# Patient Record
Sex: Male | Born: 1955 | Race: White | Hispanic: No | Marital: Single | Smoking: Never smoker
Health system: Southern US, Community
[De-identification: ages and names within clinical notes are randomized; demographics above are authoritative.]

## PROBLEM LIST (undated history)

## (undated) DIAGNOSIS — F79 Unspecified intellectual disabilities: Secondary | ICD-10-CM

## (undated) DIAGNOSIS — M810 Age-related osteoporosis without current pathological fracture: Secondary | ICD-10-CM

## (undated) DIAGNOSIS — E46 Unspecified protein-calorie malnutrition: Secondary | ICD-10-CM

## (undated) DIAGNOSIS — S8290XA Unspecified fracture of unspecified lower leg, initial encounter for closed fracture: Secondary | ICD-10-CM

## (undated) DIAGNOSIS — E079 Disorder of thyroid, unspecified: Secondary | ICD-10-CM

## (undated) DIAGNOSIS — N183 Chronic kidney disease, stage 3 (moderate): Secondary | ICD-10-CM

## (undated) DIAGNOSIS — K56609 Unspecified intestinal obstruction, unspecified as to partial versus complete obstruction: Secondary | ICD-10-CM

## (undated) DIAGNOSIS — E785 Hyperlipidemia, unspecified: Secondary | ICD-10-CM

## (undated) DIAGNOSIS — F840 Autistic disorder: Secondary | ICD-10-CM

## (undated) DIAGNOSIS — E7800 Pure hypercholesterolemia, unspecified: Secondary | ICD-10-CM

## (undated) DIAGNOSIS — H3320 Serous retinal detachment, unspecified eye: Secondary | ICD-10-CM

## (undated) HISTORY — PX: ABDOMINAL SURGERY: SHX537

## (undated) HISTORY — PX: LEG SURGERY: SHX1003

## (undated) HISTORY — PX: APPENDECTOMY: SHX54

---

## 2011-09-25 ENCOUNTER — Encounter (HOSPITAL_BASED_OUTPATIENT_CLINIC_OR_DEPARTMENT_OTHER): Payer: Self-pay | Admitting: Emergency Medicine

## 2011-09-25 ENCOUNTER — Emergency Department (HOSPITAL_BASED_OUTPATIENT_CLINIC_OR_DEPARTMENT_OTHER): Payer: Medicare Other

## 2011-09-25 ENCOUNTER — Emergency Department (HOSPITAL_BASED_OUTPATIENT_CLINIC_OR_DEPARTMENT_OTHER)
Admission: EM | Admit: 2011-09-25 | Discharge: 2011-09-25 | Disposition: A | Discharge status: Home / Self Care | Payer: Medicare Other | Attending: Emergency Medicine | Admitting: Emergency Medicine

## 2011-09-25 DIAGNOSIS — E7800 Pure hypercholesterolemia, unspecified: Secondary | ICD-10-CM | POA: Insufficient documentation

## 2011-09-25 DIAGNOSIS — K5900 Constipation, unspecified: Secondary | ICD-10-CM | POA: Insufficient documentation

## 2011-09-25 DIAGNOSIS — N289 Disorder of kidney and ureter, unspecified: Secondary | ICD-10-CM | POA: Insufficient documentation

## 2011-09-25 DIAGNOSIS — Z79899 Other long term (current) drug therapy: Secondary | ICD-10-CM | POA: Insufficient documentation

## 2011-09-25 DIAGNOSIS — F840 Autistic disorder: Secondary | ICD-10-CM | POA: Insufficient documentation

## 2011-09-25 HISTORY — DX: Unspecified intestinal obstruction, unspecified as to partial versus complete obstruction: K56.609

## 2011-09-25 HISTORY — DX: Age-related osteoporosis without current pathological fracture: M81.0

## 2011-09-25 HISTORY — DX: Unspecified fracture of unspecified lower leg, initial encounter for closed fracture: S82.90XA

## 2011-09-25 HISTORY — DX: Pure hypercholesterolemia, unspecified: E78.00

## 2011-09-25 HISTORY — DX: Autistic disorder: F84.0

## 2011-09-25 LAB — URINALYSIS, ROUTINE W REFLEX MICROSCOPIC
Glucose, UA: NEGATIVE mg/dL
Ketones, ur: NEGATIVE mg/dL
Leukocytes, UA: NEGATIVE
Nitrite: NEGATIVE
Protein, ur: 100 mg/dL — AB
Urobilinogen, UA: 0.2 mg/dL (ref 0.0–1.0)

## 2011-09-25 LAB — COMPREHENSIVE METABOLIC PANEL
Albumin: 4.6 g/dL (ref 3.5–5.2)
BUN: 19 mg/dL (ref 6–23)
Chloride: 104 mEq/L (ref 96–112)
Creatinine, Ser: 1.8 mg/dL — ABNORMAL HIGH (ref 0.50–1.35)
GFR calc Af Amer: 48 mL/min — ABNORMAL LOW (ref >90)
GFR calc non Af Amer: 41 mL/min — ABNORMAL LOW (ref >90)
Glucose, Bld: 98 mg/dL (ref 70–99)
Total Bilirubin: 0.3 mg/dL (ref 0.3–1.2)

## 2011-09-25 LAB — URINE MICROSCOPIC-ADD ON

## 2011-09-25 LAB — CBC
HCT: 47.3 % (ref 39.0–52.0)
Hemoglobin: 16.5 g/dL (ref 13.0–17.0)
MCV: 92.9 fL (ref 78.0–100.0)
RDW: 12.2 % (ref 11.5–15.5)
WBC: 7.3 10*3/uL (ref 4.0–10.5)

## 2011-09-25 MED ORDER — SODIUM CHLORIDE 0.9 % IV SOLN: Freq: Once | INTRAVENOUS | Status: DC

## 2011-09-25 MED ORDER — SODIUM CHLORIDE 0.9 % IV BOLUS (SEPSIS)
1000.0000 mL | Freq: Once | INTRAVENOUS | Status: AC
  Administered 2011-09-25: 1000 mL via INTRAVENOUS

## 2011-09-25 NOTE — ED Provider Notes (Signed)
History     CSN: [id number]  Arrival date & time 09/25/11  1335   First MD Initiated Contact with Patient 09/25/11 1515      Chief Complaint  Patient presents with  . Anorexia  . Constipation  . Fatigue    (Consider location/radiation/quality/duration/timing/severity/associated sxs/prior treatment) Patient is a 56 y.o. male presenting with constipation. The history is provided by a caregiver.  Constipation    patient here with altered mental status consisting of decreased level of consciousness according to the caregiver. No recent fever, vomiting, chills. No recent history of falls. Patient has a history of autism and is nonverbal at baseline. Caregiverhe has had decreased urination and decreased activity per his norm. No recent change in his phenobarbital medications. He is currently being evaluated for thyroid fluctuations. She also notes that he is an sleeping a lot and has been slightly warm to touch no medications taken prior to arrival. Nothing seems to make his symptoms better or worse  Past Medical History  Diagnosis Date  . Autism spectrum disorder   . Hypercholesteremia   . Thyroid condition   . Bowel obstruction   . Broken leg   . Osteoporosis     Past Surgical History  Procedure Date  . Abdominal surgery   . Leg surgery     History reviewed. No pertinent family history.  History  Substance Use Topics  . Smoking status: Never Smoker   . Smokeless tobacco: Not on file  . Alcohol Use: No      Review of Systems  Gastrointestinal: Positive for constipation.  All other systems reviewed and are negative.    Allergies  Review of patient's allergies indicates no known allergies.  Home Medications   Current Outpatient Rx  Name Route Sig Dispense Refill  . CALCIUM CITRATE-VITAMIN D 200-200 MG-UNIT PO TABS Oral Take 1 tablet by mouth daily.    Marland Kitchen VITAMIN D 1000 UNITS PO TABS Oral Take 2,000 Units by mouth daily.    Marland Kitchen DOCUSATE SODIUM 100 MG PO CAPS Oral  Take 100 mg by mouth 2 (two) times daily. Patient is given this medication to help with constipation.    . MULTI-VITAMIN/MINERALS PO TABS Oral Take 1 tablet by mouth daily.    Marland Kitchen PHENOBARBITAL 30 MG PO TABS Oral Take 30 mg by mouth 2 (two) times daily.    Marland Kitchen PHENOBARBITAL 60 MG PO TABS Oral Take 60 mg by mouth 2 (two) times daily.    Marland Kitchen SIMVASTATIN 40 MG PO TABS Oral Take 40 mg by mouth every evening.      BP 135/91  Pulse 98  Temp 97.6 F (36.4 C) (Oral)  Resp 20  Ht 6' (1.829 m)  Wt 180 lb (81.647 kg)  BMI 24.41 kg/m2  SpO2 99%  Physical Exam  Nursing note and vitals reviewed. Constitutional: He appears well-developed and well-nourished.  Non-toxic appearance. No distress.  HENT:  Head: Normocephalic and atraumatic.  Eyes: Conjunctivae, EOM and lids are normal. Pupils are equal, round, and reactive to light.  Neck: Normal range of motion. Neck supple. No tracheal deviation present. No mass present.  Cardiovascular: Normal rate, regular rhythm and normal heart sounds.  Exam reveals no gallop.   No murmur heard. Pulmonary/Chest: Effort normal and breath sounds normal. No stridor. No respiratory distress. He has no decreased breath sounds. He has no wheezes. He has no rhonchi. He has no rales.  Abdominal: Soft. Normal appearance and bowel sounds are normal. He exhibits no distension. There is no  tenderness. There is no rebound and no CVA tenderness.  Musculoskeletal: Normal range of motion. He exhibits no edema and no tenderness.  Neurological: He is alert. He has normal strength. No cranial nerve deficit or sensory deficit.  Skin: Skin is warm and dry. No abrasion and no rash noted.  Psychiatric: His affect is blunt. He is slowed.    ED Course  Procedures (including critical care time)  Labs Reviewed  COMPREHENSIVE METABOLIC PANEL - Abnormal; Notable for the following:    Creatinine, Ser 1.80 (*)     Alkaline Phosphatase 124 (*)     GFR calc non Af Amer 41 (*)     GFR calc Af  Amer 48 (*)     All other components within normal limits  CBC  URINALYSIS, ROUTINE W REFLEX MICROSCOPIC  CBC WITH DIFFERENTIAL  COMPREHENSIVE METABOLIC PANEL  T4  TSH  PHENOBARBITAL LEVEL  URINE CULTURE   No results found.   No diagnosis found.    MDM  Patient given IV fluids here and it has improved his activity . Patient's renal insufficiency noted and family instructed to followup with his Dr. for this. No evidence of serious back to infection at this time. Caregivers note that patient has had similar symptoms of waxing and waning like this in the past        Toy Baker, MD 09/25/11 [id number]

## 2011-09-25 NOTE — ED Notes (Signed)
Pt has decreased appetite, decreased bm, sleeping a lot, holding his head as if something is wrong.  Pt is autistic and non-verbal.

## 2011-09-25 NOTE — Discharge Instructions (Signed)
Followup with your Dr. next week to have your kidney function repeated. Your creatinine was 1.8 here and this needs to be rechecked. Have your doctor followup on your thyroid function studies which were performed here. Return here at once for vomiting, fever, or any other problems Dehydration, Adult Dehydration is when you lose more fluids from the body than you take in. Vital organs like the kidneys, brain, and heart cannot function without a proper amount of fluids and salt. Any loss of fluids from the body can cause dehydration.  CAUSES   Vomiting.   Diarrhea.   Excessive sweating.   Excessive urine output.   Fever.  SYMPTOMS  Mild dehydration  Thirst.   Dry lips.   Slightly dry mouth.  Moderate dehydration  Very dry mouth.   Sunken eyes.   Skin does not bounce back quickly when lightly pinched and released.   Dark urine and decreased urine production.   Decreased tear production.   Headache.  Severe dehydration  Very dry mouth.   Extreme thirst.   Rapid, weak pulse (more than 100 beats per minute at rest).   Cold hands and feet.   Not able to sweat in spite of heat and temperature.   Rapid breathing.   Blue lips.   Confusion and lethargy.   Difficulty being awakened.   Minimal urine production.   No tears.  DIAGNOSIS  Your caregiver will diagnose dehydration based on your symptoms and your exam. Blood and urine tests will help confirm the diagnosis. The diagnostic evaluation should also identify the cause of dehydration. TREATMENT  Treatment of mild or moderate dehydration can often be done at home by increasing the amount of fluids that you drink. It is best to drink small amounts of fluid more often. Drinking too much at one time can make vomiting worse. Refer to the home care instructions below. Severe dehydration needs to be treated at the hospital where you will probably be given intravenous (IV) fluids that contain water and electrolytes. HOME  CARE INSTRUCTIONS   Ask your caregiver about specific rehydration instructions.   Drink enough fluids to keep your urine clear or pale yellow.   Drink small amounts frequently if you have nausea and vomiting.   Eat as you normally do.   Avoid:   Foods or drinks high in sugar.   Carbonated drinks.   Juice.   Extremely hot or cold fluids.   Drinks with caffeine.   Fatty, greasy foods.   Alcohol.   Tobacco.   Overeating.   Gelatin desserts.   Wash your hands well to avoid spreading bacteria and viruses.   Only take over-the-counter or prescription medicines for pain, discomfort, or fever as directed by your caregiver.   Ask your caregiver if you should continue all prescribed and over-the-counter medicines.   Keep all follow-up appointments with your caregiver.  SEEK MEDICAL CARE IF:  You have abdominal pain and it increases or stays in one area (localizes).   You have a rash, stiff neck, or severe headache.   You are irritable, sleepy, or difficult to awaken.   You are weak, dizzy, or extremely thirsty.  SEEK IMMEDIATE MEDICAL CARE IF:   You are unable to keep fluids down or you get worse despite treatment.   You have frequent episodes of vomiting or diarrhea.   You have blood or green matter (bile) in your vomit.   You have blood in your stool or your stool looks black and tarry.   You  have not urinated in 6 to 8 hours, or you have only urinated a small amount of very dark urine.   You have a fever.   You faint.  MAKE SURE YOU:   Understand these instructions.   Will watch your condition.   Will get help right away if you are not doing well or get worse.  Document Released: 03/17/2005 Document Revised: 03/06/2011 Document Reviewed: 11/04/2010 [employer] [facility] [employer] Patient Information 2012 [place], [place].

## 2011-09-26 LAB — T4: T4, Total: 7.3 ug/dL (ref 5.0–12.5)

## 2011-09-26 LAB — URINE CULTURE
Colony Count: NO GROWTH
Culture: NO GROWTH

## 2012-03-26 ENCOUNTER — Encounter: Payer: Self-pay | Admitting: *Deleted

## 2012-03-26 ENCOUNTER — Emergency Department (INDEPENDENT_AMBULATORY_CARE_PROVIDER_SITE_OTHER)
Admission: EM | Admit: 2012-03-26 | Discharge: 2012-03-26 | Disposition: A | Discharge status: Home / Self Care | Payer: Medicare Other | Source: Home / Self Care | Attending: Family Medicine | Admitting: Family Medicine

## 2012-03-26 DIAGNOSIS — J209 Acute bronchitis, unspecified: Secondary | ICD-10-CM

## 2012-03-26 DIAGNOSIS — R197 Diarrhea, unspecified: Secondary | ICD-10-CM

## 2012-03-26 HISTORY — DX: Unspecified intellectual disabilities: F79

## 2012-03-26 HISTORY — DX: Hyperlipidemia, unspecified: E78.5

## 2012-03-26 HISTORY — DX: Unspecified protein-calorie malnutrition: E46

## 2012-03-26 MED ORDER — GUAIFENESIN-CODEINE 100-10 MG/5ML PO SYRP: 10.0000 mL | ORAL_SOLUTION | Freq: Every day | ORAL | Status: DC

## 2012-03-26 MED ORDER — AZITHROMYCIN 250 MG PO TABS: ORAL_TABLET | ORAL | Status: DC

## 2012-03-26 NOTE — ED Provider Notes (Signed)
History     CSN: [id number]  Arrival date & time 03/26/12  1414   First MD Initiated Contact with Patient 03/26/12 1448      Chief Complaint  Patient presents with  . Nasal Congestion  . Cough      HPI Comments: Patient has autism and mentally disabled; presents with his brother. Duane developed a mild cough, mild sore throat, and nasal congestion about a week ago.  These symptoms have persisted and about 40 hours ago he developed watery diarrhea.  No nausea/vomiting.  No abdominal pain.  No blood in stool.  The history is provided by a relative.    Past Medical History  Diagnosis Date  . Autism spectrum disorder   . Hypercholesteremia   . Thyroid condition   . Bowel obstruction   . Broken leg   . Osteoporosis   . Mental retardation   . Hyperlipidemia   . Malnutrition     Past Surgical History  Procedure Date  . Abdominal surgery   . Leg surgery     Family History  Problem Relation Age of Onset  . Hypertension Other   . Diabetes Other   . Hyperlipidemia Other   . Scoliosis Other     History  Substance Use Topics  . Smoking status: Never Smoker   . Smokeless tobacco: Not on file  . Alcohol Use: No      Review of Systems + sore throat + cough No pleuritic pain No wheezing + nasal congestion No itchy/red eyes No earache No hemoptysis No SOB No fever/chills No nausea No vomiting No abdominal pain + diarrhea No urinary symptoms No skin rashes + fatigue ? myalgias + headache Used OTC meds without relief   Allergies  Review of patient's allergies indicates no known allergies.  Home Medications   Current Outpatient Rx  Name  Route  Sig  Dispense  Refill  . AZITHROMYCIN 250 MG PO TABS      Take 2 tabs today; then begin one tab once daily for 4 more days.   6 each   0   . CALCIUM CITRATE-VITAMIN D 200-200 MG-UNIT PO TABS   Oral   Take 1 tablet by mouth daily.         Marland Kitchen VITAMIN D 1000 UNITS PO TABS   Oral   Take 2,000 Units by  mouth daily.         Marland Kitchen DOCUSATE SODIUM 100 MG PO CAPS   Oral   Take 100 mg by mouth 2 (two) times daily. Patient is given this medication to help with constipation.         . GUAIFENESIN-CODEINE 100-10 MG/5ML PO SYRP   Oral   Take 10 mLs by mouth at bedtime. for cough   120 mL   0   . MULTI-VITAMIN/MINERALS PO TABS   Oral   Take 1 tablet by mouth daily.         Marland Kitchen PHENOBARBITAL 30 MG PO TABS   Oral   Take 30 mg by mouth 2 (two) times daily.         Marland Kitchen PHENOBARBITAL 60 MG PO TABS   Oral   Take 60 mg by mouth 2 (two) times daily.         Marland Kitchen SIMVASTATIN 40 MG PO TABS   Oral   Take 40 mg by mouth every evening.           BP 145/94  Pulse 87  Temp 98.1 F (36.7 C) (Oral)  Resp 16  Ht 5\' 11"  (1.803 m)  Wt 180 lb 8 oz (81.874 kg)  BMI 25.17 kg/m2  SpO2 97%  Physical Exam Nursing notes and Vital Signs reviewed. Appearance:  Patient appears stated age, and in no acute distress.  He is alert and generally cooperative, but does not communicate verbally. Eyes:  Pupils are equal, round, and reactive to light and accomodation.  Extraocular movement is intact.  Conjunctivae are not inflamed  Ears:  Canals normal.  Tympanic membranes normal.  Nose:  Mildly congested turbinates.  No sinus tenderness.   Pharynx:  Normal Neck:  Supple.  Slightly tender shotty posterior nodes are palpated bilaterally  Lungs:  Clear to auscultation.  Breath sounds are equal.  Heart:  Regular rate and rhythm without murmurs, rubs, or gallops.  Abdomen:  Nontender without masses or hepatosplenomegaly.  Bowel sounds are present.  No CVA or flank tenderness.  Extremities:  No edema.  No calf tenderness Skin:  No rash present.   ED Course  Procedures  none      1. Acute bronchitis   2. Diarrhea       MDM  With onset of diarrhea raises possibility of atypical agent. Begin Z-pack.  Tussi-organidin at bedtime for cough. Begin Pedialyte until diarrhea slows, then may switch to clear  liquid diet.  Advance to a BRAT diet as tolerated, and finally advance to regular diet.  Avoid milk products until well. Take plain Mucinex (guaifenesin) twice daily for cough and congestion.  Increase fluid intake, rest. Follow-up with family doctor if not improving 7 to 10 days.         Lattie Haw, MD 03/29/12 [phone]

## 2012-03-26 NOTE — ED Notes (Signed)
Patient c/o cough, congestion x 1 wk; has tried Robitussin with little help. Patient c/o diarrhea x 36 hours.

## 2012-09-03 ENCOUNTER — Encounter: Payer: Self-pay | Admitting: Family Medicine

## 2012-09-03 ENCOUNTER — Ambulatory Visit (INDEPENDENT_AMBULATORY_CARE_PROVIDER_SITE_OTHER): Payer: Medicare Other | Admitting: Family Medicine

## 2012-09-03 VITALS — BP 116/75 | HR 58 | Wt 181.0 lb

## 2012-09-03 DIAGNOSIS — R531 Weakness: Secondary | ICD-10-CM

## 2012-09-03 DIAGNOSIS — G40909 Epilepsy, unspecified, not intractable, without status epilepticus: Secondary | ICD-10-CM | POA: Insufficient documentation

## 2012-09-03 DIAGNOSIS — G4700 Insomnia, unspecified: Secondary | ICD-10-CM

## 2012-09-03 DIAGNOSIS — F79 Unspecified intellectual disabilities: Secondary | ICD-10-CM | POA: Insufficient documentation

## 2012-09-03 DIAGNOSIS — Z8669 Personal history of other diseases of the nervous system and sense organs: Secondary | ICD-10-CM | POA: Insufficient documentation

## 2012-09-03 DIAGNOSIS — E785 Hyperlipidemia, unspecified: Secondary | ICD-10-CM | POA: Insufficient documentation

## 2012-09-03 DIAGNOSIS — M4120 Other idiopathic scoliosis, site unspecified: Secondary | ICD-10-CM | POA: Insufficient documentation

## 2012-09-03 DIAGNOSIS — M67919 Unspecified disorder of synovium and tendon, unspecified shoulder: Secondary | ICD-10-CM

## 2012-09-03 DIAGNOSIS — R269 Unspecified abnormalities of gait and mobility: Secondary | ICD-10-CM

## 2012-09-03 DIAGNOSIS — Z87898 Personal history of other specified conditions: Secondary | ICD-10-CM

## 2012-09-03 DIAGNOSIS — M810 Age-related osteoporosis without current pathological fracture: Secondary | ICD-10-CM | POA: Insufficient documentation

## 2012-09-03 DIAGNOSIS — M75100 Unspecified rotator cuff tear or rupture of unspecified shoulder, not specified as traumatic: Secondary | ICD-10-CM

## 2012-09-03 DIAGNOSIS — R5383 Other fatigue: Secondary | ICD-10-CM

## 2012-09-03 DIAGNOSIS — F5101 Primary insomnia: Secondary | ICD-10-CM | POA: Insufficient documentation

## 2012-09-03 DIAGNOSIS — R5381 Other malaise: Secondary | ICD-10-CM

## 2012-09-03 DIAGNOSIS — F840 Autistic disorder: Secondary | ICD-10-CM | POA: Insufficient documentation
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
## 2012-09-07 ENCOUNTER — Ambulatory Visit: Payer: Medicare Other | Attending: Family Medicine | Admitting: Rehabilitation

## 2012-09-07 ENCOUNTER — Encounter: Payer: Self-pay | Admitting: Family Medicine

## 2012-09-07 DIAGNOSIS — M25619 Stiffness of unspecified shoulder, not elsewhere classified: Secondary | ICD-10-CM | POA: Insufficient documentation

## 2012-09-07 DIAGNOSIS — IMO0001 Reserved for inherently not codable concepts without codable children: Secondary | ICD-10-CM | POA: Insufficient documentation

## 2012-09-09 ENCOUNTER — Ambulatory Visit: Payer: Medicare Other | Admitting: Rehabilitation

## 2012-09-13 ENCOUNTER — Ambulatory Visit: Payer: Medicare Other | Admitting: Rehabilitation

## 2012-09-16 ENCOUNTER — Ambulatory Visit: Payer: Medicare Other | Admitting: Rehabilitation

## 2012-09-20 ENCOUNTER — Ambulatory Visit: Payer: Medicare Other | Admitting: Rehabilitation

## 2012-09-23 ENCOUNTER — Ambulatory Visit: Payer: Medicare Other | Admitting: Rehabilitation

## 2013-02-23 ENCOUNTER — Encounter (HOSPITAL_BASED_OUTPATIENT_CLINIC_OR_DEPARTMENT_OTHER): Payer: Self-pay | Admitting: Emergency Medicine

## 2013-02-23 ENCOUNTER — Emergency Department (HOSPITAL_BASED_OUTPATIENT_CLINIC_OR_DEPARTMENT_OTHER): Payer: Medicare Other

## 2013-02-23 ENCOUNTER — Inpatient Hospital Stay (HOSPITAL_BASED_OUTPATIENT_CLINIC_OR_DEPARTMENT_OTHER)
Admission: EM | Admit: 2013-02-23 | Discharge: 2013-03-01 | DRG: 389 | Disposition: A | Discharge status: Home / Self Care | Payer: Medicare Other | Attending: Surgery | Admitting: Surgery

## 2013-02-23 DIAGNOSIS — F79 Unspecified intellectual disabilities: Secondary | ICD-10-CM | POA: Diagnosis present

## 2013-02-23 DIAGNOSIS — K562 Volvulus: Secondary | ICD-10-CM | POA: Diagnosis present

## 2013-02-23 DIAGNOSIS — Y838 Other surgical procedures as the cause of abnormal reaction of the patient, or of later complication, without mention of misadventure at the time of the procedure: Secondary | ICD-10-CM | POA: Diagnosis present

## 2013-02-23 DIAGNOSIS — E7800 Pure hypercholesterolemia, unspecified: Secondary | ICD-10-CM | POA: Diagnosis present

## 2013-02-23 DIAGNOSIS — R630 Anorexia: Secondary | ICD-10-CM | POA: Diagnosis present

## 2013-02-23 DIAGNOSIS — M810 Age-related osteoporosis without current pathological fracture: Secondary | ICD-10-CM | POA: Diagnosis present

## 2013-02-23 DIAGNOSIS — E860 Dehydration: Secondary | ICD-10-CM | POA: Diagnosis present

## 2013-02-23 DIAGNOSIS — K56609 Unspecified intestinal obstruction, unspecified as to partial versus complete obstruction: Secondary | ICD-10-CM

## 2013-02-23 DIAGNOSIS — Z8719 Personal history of other diseases of the digestive system: Secondary | ICD-10-CM | POA: Diagnosis present

## 2013-02-23 DIAGNOSIS — F840 Autistic disorder: Secondary | ICD-10-CM | POA: Diagnosis present

## 2013-02-23 DIAGNOSIS — K565 Intestinal adhesions [bands], unspecified as to partial versus complete obstruction: Principal | ICD-10-CM | POA: Diagnosis present

## 2013-02-23 DIAGNOSIS — E785 Hyperlipidemia, unspecified: Secondary | ICD-10-CM | POA: Diagnosis present

## 2013-02-23 DIAGNOSIS — N289 Disorder of kidney and ureter, unspecified: Secondary | ICD-10-CM | POA: Diagnosis present

## 2013-02-23 LAB — URINALYSIS, ROUTINE W REFLEX MICROSCOPIC
Bilirubin Urine: NEGATIVE
Ketones, ur: NEGATIVE mg/dL
Leukocytes, UA: NEGATIVE
Nitrite: NEGATIVE
Protein, ur: 100 mg/dL — AB

## 2013-02-23 LAB — URINE MICROSCOPIC-ADD ON

## 2013-02-23 MED ORDER — FENTANYL CITRATE 0.05 MG/ML IJ SOLN
50.0000 ug | Freq: Once | INTRAMUSCULAR | Status: AC
  Administered 2013-02-24: 50 ug via INTRAVENOUS
  Filled 2013-02-23: qty 2

## 2013-02-23 MED ORDER — SODIUM CHLORIDE 0.9 % IV BOLUS (SEPSIS)
500.0000 mL | Freq: Once | INTRAVENOUS | Status: AC
  Administered 2013-02-24: 1000 mL via INTRAVENOUS

## 2013-02-23 NOTE — ED Notes (Signed)
Pt family states that he noticed the pt to be more restless and curled up in the bed yesterday.  Pt family is concerned that the pt may be having abd pain.

## 2013-02-23 NOTE — ED Provider Notes (Signed)
CSN: [id number]     Arrival date & time 02/23/13  2321 History  This chart was scribed for Ruby Dilone Smitty Cords, MD by Ardelia Mems, ED Scribe. This patient was seen in room MH05/MH05 and the patient's care was started at 11:34 PM.   Chief Complaint  Patient presents with  . Abdominal Pain    Patient is a 57 y.o. male presenting with abdominal pain. The history is provided by a relative, a caregiver and the patient. The history is limited by the condition of the patient (Pt nonverbal, hx of MR). No language interpreter was used.  Abdominal Pain Pain location:  Generalized Onset quality:  Gradual Timing:  Constant Progression:  Unchanged Relieved by:  Nothing Worsened by:  Nothing tried Ineffective treatments:  None tried Associated symptoms: no vomiting   Risk factors: multiple surgeries    LEVEL 5 CAVEAT (Nonverbal condition of the patient)  HPI Comments: Maron Stanzione is a 57 y.o. male with a history of mental retardation and autism spectrum disorder brought by relatives who are his caregivers to the Emergency Department complaining of what relatives believe is abdominal pain. Relatives report that pt is nonverbal and has not communicated specifically that been having abdominal pain, but relatives suspect he is having abdominal pain. Relatives states that pt has been "off today". Relatives state that pt has been eating less today, has been sitting with his knees retracted at times, and has been hunching over at times. Relatives also states that pt normally lives in a long term care facility in [place] and is currently visiting them this month as is his yearly routine. Relatives state that pt has been mildly constipated lately. They also states that pt's diet is different here than it was in [place]. Relatives states that pt had "twisted intestines" 20 years ago, which required 2 surgeries. Relatives deny noticing any fever, emesis, dysuria, coughing, tugging at his ears or any other  symptoms.  Relative further states that pt does not emote pain at all. They report that pt has a history of a traumatic tib/fib fracture, and that he acted "completely fine" normal after this.  PCP- Dr. [name]   Past Medical History  Diagnosis Date  . Autism spectrum disorder   . Hypercholesteremia   . Thyroid condition   . Bowel obstruction   . Broken leg Left  . Osteoporosis   . Mental retardation   . Hyperlipidemia   . Malnutrition    Past Surgical History  Procedure Laterality Date  . Abdominal surgery    . Leg surgery     Family History  Problem Relation Age of Onset  . Hypertension Other   . Diabetes Other   . Hyperlipidemia Other   . Scoliosis Other    History  Substance Use Topics  . Smoking status: Never Smoker   . Smokeless tobacco: Not on file  . Alcohol Use: No    Review of Systems  Unable to perform ROS: Patient nonverbal  Gastrointestinal: Positive for abdominal pain. Negative for vomiting.   Allergies  Ofloxacin and Phenothiazines  Home Medications   Current Outpatient Rx  Name  Route  Sig  Dispense  Refill  . calcium citrate-vitamin D 200-200 MG-UNIT TABS   Oral   Take 1 tablet by mouth daily.         . cholecalciferol (VITAMIN D) 1000 UNITS tablet   Oral   Take 2,000 Units by mouth daily.         Marland Kitchen docusate sodium (COLACE)  100 MG capsule   Oral   Take 100 mg by mouth 2 (two) times daily. Patient is given this medication to help with constipation.         . Multiple Vitamins-Minerals (MULTIVITAMIN WITH MINERALS) tablet   Oral   Take 1 tablet by mouth daily.         Marland Kitchen PHENObarbital (LUMINAL) 30 MG tablet   Oral   Take 30 mg by mouth 2 (two) times daily.         Marland Kitchen PHENObarbital (LUMINAL) 60 MG tablet   Oral   Take 60 mg by mouth 2 (two) times daily.         . simvastatin (ZOCOR) 40 MG tablet   Oral   Take 40 mg by mouth every evening.          Triage Vitals: BP 154/94  Pulse 95  Temp(Src) 98 F  (36.7 C) (Oral)  SpO2 100%  Physical Exam  Nursing note and vitals reviewed. Constitutional: He is oriented to person, place, and time. He appears well-developed and well-nourished. No distress.  Pt doesn't emote pain. Does not wince in pain even with trauma per family.  HENT:  Head: Normocephalic and atraumatic.  Moist mucous membranes.  Eyes: EOM are normal. Pupils are equal, round, and reactive to light.  Neck: Neck supple. No tracheal deviation present.  Cardiovascular: Normal rate, regular rhythm and normal heart sounds.   Pulmonary/Chest: Effort normal and breath sounds normal. No respiratory distress. He has no wheezes. He has no rales. He exhibits no tenderness.  Abdominal: Soft. He exhibits distension. There is no rebound and no guarding.  Hypoactive except in the LLQ. Large amount of gas in LLQ.  Musculoskeletal: Normal range of motion.  Neurological: He is alert and oriented to person, place, and time.  Skin: Skin is warm and dry.  Psychiatric: He has a normal mood and affect.    ED Course  Procedures (including critical care time)  DIAGNOSTIC STUDIES: Oxygen Saturation is 100% on RA, normal by my interpretation.    COORDINATION OF CARE: 11:42 PM- Relatives/caregivers advised of plan for treatment and pt agrees.  Medications  sodium chloride 0.9 % bolus 500 mL (not administered)  fentaNYL (SUBLIMAZE) injection 50 mcg (not administered)   Labs Review Labs Reviewed  URINALYSIS, ROUTINE W REFLEX MICROSCOPIC - Abnormal; Notable for the following:    Protein, ur 100 (*)    All other components within normal limits  URINE MICROSCOPIC-ADD ON - Abnormal; Notable for the following:    Casts GRANULAR CAST (*)    All other components within normal limits  CBC WITH DIFFERENTIAL  LIPASE, BLOOD  COMPREHENSIVE METABOLIC PANEL   Imaging Review No results found.  EKG Interpretation   None       MDM  No diagnosis found. SBO, multiple attempts at NG have failed.     Per Dr. Maisie Fus transfer to [facility] ED   I personally performed the services described in this documentation, which was scribed in my presence. The recorded information has been reviewed and is accurate.     Jasmine Awe, MD 02/24/13 [phone]

## 2013-02-24 ENCOUNTER — Inpatient Hospital Stay (HOSPITAL_COMMUNITY): Payer: Medicare Other

## 2013-02-24 ENCOUNTER — Emergency Department (HOSPITAL_BASED_OUTPATIENT_CLINIC_OR_DEPARTMENT_OTHER): Payer: Medicare Other

## 2013-02-24 DIAGNOSIS — Z8719 Personal history of other diseases of the digestive system: Secondary | ICD-10-CM | POA: Diagnosis present

## 2013-02-24 DIAGNOSIS — K56609 Unspecified intestinal obstruction, unspecified as to partial versus complete obstruction: Secondary | ICD-10-CM

## 2013-02-24 LAB — COMPREHENSIVE METABOLIC PANEL
ALT: 15 U/L (ref 0–53)
Alkaline Phosphatase: 145 U/L — ABNORMAL HIGH (ref 39–117)
BUN: 21 mg/dL (ref 6–23)
CO2: 26 mEq/L (ref 19–32)
GFR calc Af Amer: 47 mL/min — ABNORMAL LOW (ref >90)
GFR calc non Af Amer: 41 mL/min — ABNORMAL LOW (ref >90)
Glucose, Bld: 154 mg/dL — ABNORMAL HIGH (ref 70–99)
Potassium: 4.8 mEq/L (ref 3.5–5.1)
Sodium: 135 mEq/L (ref 135–145)
Total Bilirubin: 0.4 mg/dL (ref 0.3–1.2)

## 2013-02-24 LAB — BASIC METABOLIC PANEL
Calcium: 8.8 mg/dL (ref 8.4–10.5)
GFR calc Af Amer: 56 mL/min — ABNORMAL LOW (ref >90)
GFR calc non Af Amer: 48 mL/min — ABNORMAL LOW (ref >90)
Potassium: 5.2 mEq/L — ABNORMAL HIGH (ref 3.5–5.1)
Sodium: 131 mEq/L — ABNORMAL LOW (ref 135–145)

## 2013-02-24 LAB — CBC
Hemoglobin: 15.5 g/dL (ref 13.0–17.0)
MCHC: 35.1 g/dL (ref 30.0–36.0)
RBC: 4.73 MIL/uL (ref 4.22–5.81)
RDW: 12.4 % (ref 11.5–15.5)
WBC: 16.2 10*3/uL — ABNORMAL HIGH (ref 4.0–10.5)

## 2013-02-24 LAB — CBC WITH DIFFERENTIAL/PLATELET
Basophils Relative: 0 % (ref 0–1)
HCT: 50 % (ref 39.0–52.0)
Hemoglobin: 17.8 g/dL — ABNORMAL HIGH (ref 13.0–17.0)
Lymphs Abs: 0.8 10*3/uL (ref 0.7–4.0)
MCH: 32.9 pg (ref 26.0–34.0)
MCHC: 35.6 g/dL (ref 30.0–36.0)
Monocytes Absolute: 1.1 10*3/uL — ABNORMAL HIGH (ref 0.1–1.0)
Monocytes Relative: 6 % (ref 3–12)
Neutro Abs: 16.1 10*3/uL — ABNORMAL HIGH (ref 1.7–7.7)
Neutrophils Relative %: 90 % — ABNORMAL HIGH (ref 43–77)
RBC: 5.41 MIL/uL (ref 4.22–5.81)

## 2013-02-24 LAB — MRSA PCR SCREENING: MRSA by PCR: NEGATIVE

## 2013-02-24 MED ORDER — LIDOCAINE HCL 2 % EX GEL
CUTANEOUS | Status: AC
  Filled 2013-02-24: qty 20

## 2013-02-24 MED ORDER — FENTANYL CITRATE 0.05 MG/ML IJ SOLN
50.0000 ug | Freq: Once | INTRAMUSCULAR | Status: AC
  Administered 2013-02-24: 50 ug via INTRAVENOUS
  Filled 2013-02-24: qty 2

## 2013-02-24 MED ORDER — LIDOCAINE HCL 2 % EX GEL
CUTANEOUS | Status: AC
  Administered 2013-02-24: 02:00:00 via NASAL
  Filled 2013-02-24: qty 20

## 2013-02-24 MED ORDER — PHENOBARBITAL SODIUM 130 MG/ML IJ SOLN
30.0000 mg | Freq: Three times a day (TID) | INTRAMUSCULAR | Status: DC
  Administered 2013-02-24 – 2013-02-25 (×4): 30 mg via INTRAVENOUS
  Filled 2013-02-24 (×4): qty 1

## 2013-02-24 MED ORDER — PHENOBARBITAL 32.4 MG PO TABS: 32.4000 mg | ORAL_TABLET | Freq: Every day | ORAL | Status: DC

## 2013-02-24 MED ORDER — MIDAZOLAM HCL 2 MG/2ML IJ SOLN
INTRAMUSCULAR | Status: AC
  Filled 2013-02-24: qty 2

## 2013-02-24 MED ORDER — PHENOBARBITAL 64.8 MG PO TABS: 64.8000 mg | ORAL_TABLET | Freq: Every evening | ORAL | Status: DC

## 2013-02-24 MED ORDER — MIDAZOLAM HCL 2 MG/2ML IJ SOLN
2.0000 mg | Freq: Once | INTRAMUSCULAR | Status: AC
  Administered 2013-02-24: 1 mg via INTRAVENOUS

## 2013-02-24 MED ORDER — MORPHINE SULFATE 4 MG/ML IJ SOLN
4.0000 mg | INTRAMUSCULAR | Status: DC
  Administered 2013-02-24 – 2013-02-25 (×8): 4 mg via INTRAVENOUS
  Filled 2013-02-24 (×9): qty 1

## 2013-02-24 MED ORDER — FENTANYL CITRATE 0.05 MG/ML IJ SOLN
INTRAMUSCULAR | Status: AC
  Filled 2013-02-24: qty 2

## 2013-02-24 MED ORDER — FENTANYL CITRATE 0.05 MG/ML IJ SOLN
100.0000 ug | Freq: Once | INTRAMUSCULAR | Status: AC
  Administered 2013-02-24: 50 ug via INTRAVENOUS

## 2013-02-24 MED ORDER — ERTAPENEM SODIUM 1 G IJ SOLR
1.0000 g | Freq: Every day | INTRAMUSCULAR | Status: DC
  Administered 2013-02-24 – 2013-02-27 (×4): 1 g via INTRAVENOUS
  Filled 2013-02-24 (×4): qty 1

## 2013-02-24 MED ORDER — IOHEXOL 300 MG/ML  SOLN: 50.0000 mL | Freq: Once | INTRAMUSCULAR | Status: AC | PRN

## 2013-02-24 MED ORDER — SODIUM CHLORIDE 0.9 % IV SOLN
INTRAVENOUS | Status: DC
  Administered 2013-02-24: 17:00:00 via INTRAVENOUS
  Administered 2013-02-24: 150 mL via INTRAVENOUS
  Administered 2013-02-24: 23:00:00 via INTRAVENOUS

## 2013-02-24 MED ORDER — SODIUM CHLORIDE 0.9 % IV BOLUS (SEPSIS): 1000.0000 mL | Freq: Once | INTRAVENOUS | Status: DC

## 2013-02-24 MED ORDER — ONDANSETRON HCL 4 MG/2ML IJ SOLN
INTRAMUSCULAR | Status: AC
  Administered 2013-02-24: 4 mg via INTRAVENOUS
  Filled 2013-02-24: qty 2

## 2013-02-24 MED ORDER — ONDANSETRON HCL 4 MG/2ML IJ SOLN
4.0000 mg | Freq: Four times a day (QID) | INTRAMUSCULAR | Status: DC
  Administered 2013-02-24 – 2013-02-25 (×5): 4 mg via INTRAVENOUS
  Filled 2013-02-24 (×5): qty 2

## 2013-02-24 MED ORDER — HEPARIN SODIUM (PORCINE) 5000 UNIT/ML IJ SOLN
5000.0000 [IU] | Freq: Three times a day (TID) | INTRAMUSCULAR | Status: DC
  Administered 2013-02-24 – 2013-03-01 (×16): 5000 [IU] via SUBCUTANEOUS
  Filled 2013-02-24 (×21): qty 1

## 2013-02-24 NOTE — Progress Notes (Signed)
Pharmacy Brief Note: Phenobarbital  Request to change phenobarbital from Po to IV formulation per [facility]  Home dose = 32.4mg  daily in am, 64.8 mg daily in pm  Will begin Phenobarbital 30mg  IV q8hr this afternoon,  monitor for any adverse effects.  Thank you,   Otho Bellows PharmD Pager [phone] 02/24/2013, 5:05 PM

## 2013-02-24 NOTE — Progress Notes (Signed)
CARE MANAGEMENT NOTE 02/24/2013  Patient:  Richard [name],Richard [name]   Account Number:  [id number]  Date Initiated:  02/24/2013  Documentation initiated by:  Rodnesha Elie  Subjective/Objective Assessment:   high grade small bowel obstruction via abd films. poss sepsis.  mentally challenged adult male     Action/Plan:   brother speaks for patient/ has been living in [place] until recently   Anticipated DC Date:  02/27/2013   Anticipated DC Plan:  HOME/SELF CARE  In-house referral  NA      DC Planning Services  NA      [facility] Choice  NA   Choice offered to / List presented to:  NA   DME arranged  NA      DME agency  NA     HH arranged  NA      HH agency  NA   Status of service:  In process, will continue to follow Medicare Important Message given?  NA - LOS <3 / Initial given by admissions (If response is "NO", the following Medicare IM given date fields will be blank) Date Medicare IM given:   Date Additional Medicare IM given:    Discharge Disposition:    Per UR Regulation:  Reviewed for med. necessity/level of care/duration of stay  If discussed at Long Length of Stay Meetings, dates discussed:    Comments:  [id number]/Dusten Ellinwood Lorrin Mais: Case management [phone] Chart reviewed and updated.  Next chart review due on [id number]. Needs for discharge at time of review:  none

## 2013-02-24 NOTE — H&P (Signed)
Richard [name] is an 57 y.o. male.   Chief Complaint: lack of appetite  HPI: the patient is a 57 year old male with an altered Spectrum disorder who is nonverbal. I evaluated him with the help of his caregiver. He has been living in [place] recently had an only been down in [place] for the past couple weeks. The patient's caregiver states that he has been sick for the past couple days. He has had some constipation. Today he refused to eat and that is what prompted his caregivers to bring him to the emergency department, as this was not normal for him. They deny any fevers. His last bowel movement was this evening after dinner. He has not been vomiting.  His caregiver reports that he had 2 abdominal surgeries in the past. The stent a twisting of the bowel but sounds like a volvulus effect the surgery was done to tack the bowel to his abdominal wall to prevent twisting in the future. The surgeries were all done in [place].  Past Medical History  Diagnosis Date  . Autism spectrum disorder   . Hypercholesteremia   . Thyroid condition   . Bowel obstruction   . Broken leg Left  . Osteoporosis   . Mental retardation   . Hyperlipidemia   . Malnutrition     Past Surgical History  Procedure Laterality Date  . Abdominal surgery    . Leg surgery      Family History  Problem Relation Age of Onset  . Hypertension Other   . Diabetes Other   . Hyperlipidemia Other   . Scoliosis Other    Social History:  reports that he has never smoked. He does not have any smokeless tobacco history on file. He reports that he does not drink alcohol. His drug history is not on file.  Allergies:  Allergies  Allergen Reactions  . Ofloxacin     Unsure   . Phenothiazines Other (See Comments)    Unknown     (Not in a hospital admission)  Results for orders placed during the hospital encounter of 02/23/13 (from the past 48 hour(s))  URINALYSIS, ROUTINE W REFLEX MICROSCOPIC     Status: Abnormal    Collection Time    02/23/13 11:37 PM      Result Value Range   Color, Urine YELLOW  YELLOW   APPearance CLEAR  CLEAR   Specific Gravity, Urine 1.015  1.005 - 1.030   pH 5.5  5.0 - 8.0   Glucose, UA NEGATIVE  NEGATIVE mg/dL   Hgb urine dipstick NEGATIVE  NEGATIVE   Bilirubin Urine NEGATIVE  NEGATIVE   Ketones, ur NEGATIVE  NEGATIVE mg/dL   Protein, ur 161 (*) NEGATIVE mg/dL   Urobilinogen, UA 0.2  0.0 - 1.0 mg/dL   Nitrite NEGATIVE  NEGATIVE   Leukocytes, UA NEGATIVE  NEGATIVE  URINE MICROSCOPIC-ADD ON     Status: Abnormal   Collection Time    02/23/13 11:37 PM      Result Value Range   Squamous Epithelial / LPF RARE  RARE   WBC, UA 0-2  <3 WBC/hpf   RBC / HPF 0-2  <3 RBC/hpf   Casts GRANULAR CAST (*) NEGATIVE  CBC WITH DIFFERENTIAL     Status: Abnormal   Collection Time    02/24/13 12:28 AM      Result Value Range   WBC 17.9 (*) 4.0 - 10.5 K/uL   RBC 5.41  4.22 - 5.81 MIL/uL   Hemoglobin 17.8 (*) 13.0 -  17.0 g/dL   HCT 96.2  95.2 - 84.1 %   MCV 92.4  78.0 - 100.0 fL   MCH 32.9  26.0 - 34.0 pg   MCHC 35.6  30.0 - 36.0 g/dL   RDW 32.4  40.1 - 02.7 %   Platelets 249  150 - 400 K/uL   Neutrophils Relative % 90 (*) 43 - 77 %   Neutro Abs 16.1 (*) 1.7 - 7.7 K/uL   Lymphocytes Relative 4 (*) 12 - 46 %   Lymphs Abs 0.8  0.7 - 4.0 K/uL   Monocytes Relative 6  3 - 12 %   Monocytes Absolute 1.1 (*) 0.1 - 1.0 K/uL   Eosinophils Relative 0  0 - 5 %   Eosinophils Absolute 0.0  0.0 - 0.7 K/uL   Basophils Relative 0  0 - 1 %   Basophils Absolute 0.0  0.0 - 0.1 K/uL  LIPASE, BLOOD     Status: None   Collection Time    02/24/13 12:28 AM      Result Value Range   Lipase 44  11 - 59 U/L  COMPREHENSIVE METABOLIC PANEL     Status: Abnormal   Collection Time    02/24/13 12:28 AM      Result Value Range   Sodium 135  135 - 145 mEq/L   Potassium 4.8  3.5 - 5.1 mEq/L   Chloride 94 (*) 96 - 112 mEq/L   CO2 26  19 - 32 mEq/L   Glucose, Bld 154 (*) 70 - 99 mg/dL   BUN 21  6 - 23 mg/dL    Creatinine, Ser 2.53 (*) 0.50 - 1.35 mg/dL   Calcium 66.4  8.4 - 40.3 mg/dL   Total Protein 8.4 (*) 6.0 - 8.3 g/dL   Albumin 5.0  3.5 - 5.2 g/dL   AST 20  0 - 37 U/L   ALT 15  0 - 53 U/L   Alkaline Phosphatase 145 (*) 39 - 117 U/L   Total Bilirubin 0.4  0.3 - 1.2 mg/dL   GFR calc non Af Amer 41 (*) >90 mL/min   GFR calc Af Amer 47 (*) >90 mL/min   Comment: (NOTE)     The eGFR has been calculated using the CKD EPI equation.     This calculation has not been validated in all clinical situations.     eGFR's persistently <90 mL/min signify possible Chronic Kidney     Disease.   Ct Abdomen Pelvis Wo Contrast  02/24/2013   CLINICAL DATA:  Abdominal pain. Mild constipation. Previous abdominal surgery for twisted intestines 20 years ago.  EXAM: CT ABDOMEN AND PELVIS WITHOUT CONTRAST  TECHNIQUE: Multidetector CT imaging of the abdomen and pelvis was performed following the standard protocol without intravenous contrast.  COMPARISON:  None.  FINDINGS: There appearing mild interstitial changes in the lung bases which could represent edema or fibrosis. There is a small esophageal hiatal hernia with contrast material in the lower esophagus suggesting reflux or dysmotility.  There is marked distention of fluid in gas-filled small bowel with decompression of the terminal ileum consistent with high-grade small bowel obstruction. A specific transition zone is not appreciated but appears to be in the right lower quadrant. There is no bowel wall thickening or pneumatosis. No free intra-abdominal air.  The unenhanced appearance of the liver, spleen, gallbladder, pancreas, adrenal glands, abdominal aorta, inferior vena cava, and retroperitoneal lymph nodes is unremarkable. There is a pelvic location of the right kidney. The  kidneys appear atrophic.  Pelvis: The prostate gland is enlarged, measuring 4.7 x 5.3 cm. No free or loculated pelvic fluid collections. No significant pelvic lymphadenopathy. The appendix is not  identified. The colon is decompressed. Normal alignment of the lumbar spine. No destructive bone lesions appreciated.  IMPRESSION: March distention of gas in fluid-filled small bowel with transition zone apparently in the right lower quadrant. Changes are consistent with high-grade small bowel obstruction.   Electronically Signed   By: Burman Nieves M.D.   On: 02/24/2013 01:46   Dg Abd 1 View  02/24/2013   CLINICAL DATA:  Abdominal distention. Patient will feet. Abdominal pain.  EXAM: ABDOMEN - 1 VIEW  COMPARISON:  09/25/2011  FINDINGS: Diffuse gaseous distention of small bowel with multiple air-fluid levels. Limited gas in the colon. Supine views would be useful for better delineation of the colon. Changes are worrisome for small bowel obstruction. No free intra-abdominal air. Probable gastric distention is well. No radiopaque stones. Visualized bones appear intact.  IMPRESSION: Gaseous distention of the small bowel with multiple air-fluid levels suggesting small bowel obstruction.   Electronically Signed   By: Burman Nieves M.D.   On: 02/24/2013 00:07    Review of Systems  Unable to perform ROS: patient nonverbal    Blood pressure 133/74, pulse 81, temperature 98.6 F (37 C), temperature source Oral, resp. rate 18, SpO2 92.00%. Physical Exam  Constitutional: He appears well-developed and well-nourished. No distress.  Appears uncomfortable  HENT:  Head: Normocephalic and atraumatic.  Eyes: Conjunctivae are normal. Pupils are equal, round, and reactive to light.  Neck: Normal range of motion.  Cardiovascular: Normal rate and regular rhythm.   Respiratory: Effort normal and breath sounds normal. No respiratory distress.  GI: Soft. He exhibits distension. He exhibits no mass. There is tenderness. There is no rebound and no guarding.  Right sided tenderness to palpation  Musculoskeletal: Normal range of motion.  Neurological: He is alert.  Follows commands  Skin: Skin is warm and dry.  He is not diaphoretic.     Assessment/Plan Cheskel Silverio is a 57 year old male who presents to the emergency department with a small bowel obstruction seen on CT scan.  His vitals are stable but his lab work does indicate an elevated white blood cell count and signs of dehydration with an elevated creatinine. Given his status it is difficult to assess his acuity. He definitely seems dehydrated and I think is appropriate to send him to a step down unit for close monitoring and IV fluid resuscitation.  I will place him on IV antibiotics given his elevated white count. And the knee was attempted several times but was unable to be passed. If he responds appropriately to resuscitation, I think it is reasonable to try to place an NG tube under fluoroscopy. If he does not respond appropriately and his white count remains elevated or his right-sided abdominal pain gets worse, I think he will most likely need an operation.  Naba Sneed C. 02/24/2013, 4:49 AM

## 2013-02-24 NOTE — ED Notes (Signed)
Attempted NG tube placement in each nostril, unsuccessful. Pt tolerated well, EDP made aware.

## 2013-02-24 NOTE — Progress Notes (Signed)
Patient ID: Richard [name], male   DOB: Oct 09, 1955, 57 y.o.   MRN: [id number]  General Surgery - [facility] - Progress Note  HD# 2   Subjective: Patient in stepdown unit, family at bedside.  Episode of emesis overnight.  No BM's, no flatus per family.  Objective: Vital signs in last 24 hours: Temp:  [98 F (36.7 C)-99.8 F (37.7 C)] 99.8 F (37.7 C) (11/27 4098) Pulse Rate:  [72-97] 82 (11/27 0632) Resp:  [16-20] 16 (11/27 0508) BP: (133-170)/(68-94) 170/83 mmHg (11/27 0632) SpO2:  [89 %-100 %] 94 % (11/27 1191) Weight:  [183 lb 3.2 oz (83.1 kg)] 183 lb 3.2 oz (83.1 kg) (11/27 4782)    Intake/Output from previous day:    Exam: HEENT - clear, not icteric Neck - soft Chest - clear bilaterally Cor - RRR, rate 80's Abd - moderate distension; well healed surgical incisions; active BS; mild diffuse tenderness Ext - no significant edema  Lab Results:   Recent Labs  02/24/13 0028 02/24/13 0757  WBC 17.9* 16.2*  HGB 17.8* 15.5  HCT 50.0 44.2  PLT 249 183     Recent Labs  02/24/13 0028 02/24/13 0757  NA 135 131*  K 4.8 5.2*  CL 94* 96  CO2 26 23  GLUCOSE 154* 138*  BUN 21 20  CREATININE 1.80* 1.56*  CALCIUM 10.4 8.8    Studies/Results: Ct Abdomen Pelvis Wo Contrast  02/24/2013   CLINICAL DATA:  Abdominal pain. Mild constipation. Previous abdominal surgery for twisted intestines 20 years ago.  EXAM: CT ABDOMEN AND PELVIS WITHOUT CONTRAST  TECHNIQUE: Multidetector CT imaging of the abdomen and pelvis was performed following the standard protocol without intravenous contrast.  COMPARISON:  None.  FINDINGS: There appearing mild interstitial changes in the lung bases which could represent edema or fibrosis. There is a small esophageal hiatal hernia with contrast material in the lower esophagus suggesting reflux or dysmotility.  There is marked distention of fluid in gas-filled small bowel with decompression of the terminal ileum consistent with  high-grade small bowel obstruction. A specific transition zone is not appreciated but appears to be in the right lower quadrant. There is no bowel wall thickening or pneumatosis. No free intra-abdominal air.  The unenhanced appearance of the liver, spleen, gallbladder, pancreas, adrenal glands, abdominal aorta, inferior vena cava, and retroperitoneal lymph nodes is unremarkable. There is a pelvic location of the right kidney. The kidneys appear atrophic.  Pelvis: The prostate gland is enlarged, measuring 4.7 x 5.3 cm. No free or loculated pelvic fluid collections. No significant pelvic lymphadenopathy. The appendix is not identified. The colon is decompressed. Normal alignment of the lumbar spine. No destructive bone lesions appreciated.  IMPRESSION: March distention of gas in fluid-filled small bowel with transition zone apparently in the right lower quadrant. Changes are consistent with high-grade small bowel obstruction.   Electronically Signed   By: Burman Nieves M.D.   On: 02/24/2013 01:46   Dg Abd 1 View  02/24/2013   CLINICAL DATA:  Abdominal distention. Patient will feet. Abdominal pain.  EXAM: ABDOMEN - 1 VIEW  COMPARISON:  09/25/2011  FINDINGS: Diffuse gaseous distention of small bowel with multiple air-fluid levels. Limited gas in the colon. Supine views would be useful for better delineation of the colon. Changes are worrisome for small bowel obstruction. No free intra-abdominal air. Probable gastric distention is well. No radiopaque stones. Visualized bones appear intact.  IMPRESSION: Gaseous distention of the small bowel with multiple air-fluid levels suggesting small bowel  obstruction.   Electronically Signed   By: Burman Nieves M.D.   On: 02/24/2013 00:07    Assessment / Plan: 1.  Small bowel obstruction likely secondary to adhesions  Discussed with family at bedside  Records from [place] provided by family - laparotomy in 1991 with appendectomy; laparotomy in 1992 for lysis of  adhesions; subsequent admission for SBO resolved with medical management  Will request NG placement with fluoro by radiology - discussed with Dr. Corky Downs  Plan NG decompression, bowel rest, IV hydration for 24-48 hours - if no improvement or clinically deteriorates, will proceed to laparotomy  Velora Heckler, MD, [facility] Office: [phone]  02/24/2013

## 2013-02-24 NOTE — ED Notes (Signed)
Bed: WA06 Expected date:  Expected time:  Means of arrival:  Comments: Transfer [facility]/SBO/autistic/surgeon is Romie Levee

## 2013-02-24 NOTE — Plan of Care (Signed)
Problem: Consults Goal: General Medical Patient Education See Patient Education Module for specific education. Outcome: Progressing Small bowel obstruction

## 2013-02-25 LAB — CBC
MCH: 32.5 pg (ref 26.0–34.0)
MCHC: 34.3 g/dL (ref 30.0–36.0)
MCV: 94.7 fL (ref 78.0–100.0)
Platelets: 181 10*3/uL (ref 150–400)
RBC: 4.19 MIL/uL — ABNORMAL LOW (ref 4.22–5.81)
RDW: 12.8 % (ref 11.5–15.5)

## 2013-02-25 LAB — BASIC METABOLIC PANEL
CO2: 22 mEq/L (ref 19–32)
Calcium: 8.6 mg/dL (ref 8.4–10.5)
Creatinine, Ser: 1.72 mg/dL — ABNORMAL HIGH (ref 0.50–1.35)
GFR calc Af Amer: 50 mL/min — ABNORMAL LOW (ref >90)
GFR calc non Af Amer: 43 mL/min — ABNORMAL LOW (ref >90)
Potassium: 4.3 mEq/L (ref 3.5–5.1)

## 2013-02-25 MED ORDER — ONDANSETRON HCL 4 MG/2ML IJ SOLN
4.0000 mg | Freq: Four times a day (QID) | INTRAMUSCULAR | Status: DC
  Administered 2013-02-25 – 2013-03-01 (×14): 4 mg via INTRAVENOUS
  Filled 2013-02-25 (×14): qty 2

## 2013-02-25 MED ORDER — MORPHINE SULFATE 2 MG/ML IJ SOLN
2.0000 mg | INTRAMUSCULAR | Status: DC | PRN
  Administered 2013-02-26 (×2): 2 mg via INTRAVENOUS
  Filled 2013-02-25 (×2): qty 1

## 2013-02-25 MED ORDER — KCL IN DEXTROSE-NACL 30-5-0.45 MEQ/L-%-% IV SOLN
INTRAVENOUS | Status: DC
  Administered 2013-02-25 – 2013-02-26 (×3): 125 mL/h via INTRAVENOUS
  Administered 2013-02-27 – 2013-02-28 (×4): via INTRAVENOUS
  Filled 2013-02-25 (×12): qty 1000

## 2013-02-25 MED ORDER — LIP MEDEX EX OINT
TOPICAL_OINTMENT | CUTANEOUS | Status: AC
  Administered 2013-02-25: 20:00:00
  Filled 2013-02-25: qty 7

## 2013-02-25 MED ORDER — PHENOBARBITAL SODIUM 65 MG/ML IJ SOLN
30.0000 mg | Freq: Three times a day (TID) | INTRAMUSCULAR | Status: DC
  Administered 2013-02-25 – 2013-03-01 (×11): 30 mg via INTRAVENOUS
  Filled 2013-02-25 (×12): qty 1

## 2013-02-25 NOTE — Progress Notes (Signed)
Patient removed the 2LNC, O2 sats dropped to 85%.  Replaced  at 2L and O2 sats are now 92-95%.

## 2013-02-25 NOTE — Progress Notes (Signed)
Patient ID: Richard [name], male   DOB: [birth year], 57 y.o.   MRN: [id number]  General Surgery - [facility] - Progress Note  POD# 3  Subjective: Patient moved to ICU for closer observation - trying to get OOB.  Unable to place NG in radiology.  Objective: Vital signs in last 24 hours: Temp:  [98.6 F (37 C)-100.4 F (38 C)] 99.6 F (37.6 C) (11/28 0409) Pulse Rate:  [67-111] 77 (11/28 0500) Resp:  [12-29] 19 (11/28 0500) BP: (124-171)/(67-121) 148/80 mmHg (11/27 1800) SpO2:  [84 %-100 %] 94 % (11/28 0506) Weight:  [185 lb 10 oz (84.2 kg)] 185 lb 10 oz (84.2 kg) (11/28 0409) Last BM Date:  (unknown)  Intake/Output from previous day: 11/27 0701 - 11/28 0700 In: 3200 [I.V.:3150; IV Piggyback:50] Out: 1260 [Urine:1260]  Exam: HEENT - clear, not icteric Neck - soft Chest - clear bilaterally Cor - RRR, no murmur Abd - moderately distended, tympanitic; not apparently tender; active BS present Ext - no significant edema  Lab Results:   Recent Labs  02/24/13 0028 02/24/13 0757  WBC 17.9* 16.2*  HGB 17.8* 15.5  HCT 50.0 44.2  PLT 249 183     Recent Labs  02/24/13 0028 02/24/13 0757  NA 135 131*  K 4.8 5.2*  CL 94* 96  CO2 26 23  GLUCOSE 154* 138*  BUN 21 20  CREATININE 1.80* 1.56*  CALCIUM 10.4 8.8    Studies/Results: Ct Abdomen Pelvis Wo Contrast  02/24/2013   CLINICAL DATA:  Abdominal pain. Mild constipation. Previous abdominal surgery for twisted intestines 20 years ago.  EXAM: CT ABDOMEN AND PELVIS WITHOUT CONTRAST  TECHNIQUE: Multidetector CT imaging of the abdomen and pelvis was performed following the standard protocol without intravenous contrast.  COMPARISON:  None.  FINDINGS: There appearing mild interstitial changes in the lung bases which could represent edema or fibrosis. There is a small esophageal hiatal hernia with contrast material in the lower esophagus suggesting reflux or dysmotility.  There is marked distention of fluid in  gas-filled small bowel with decompression of the terminal ileum consistent with high-grade small bowel obstruction. A specific transition zone is not appreciated but appears to be in the right lower quadrant. There is no bowel wall thickening or pneumatosis. No free intra-abdominal air.  The unenhanced appearance of the liver, spleen, gallbladder, pancreas, adrenal glands, abdominal aorta, inferior vena cava, and retroperitoneal lymph nodes is unremarkable. There is a pelvic location of the right kidney. The kidneys appear atrophic.  Pelvis: The prostate gland is enlarged, measuring 4.7 x 5.3 cm. No free or loculated pelvic fluid collections. No significant pelvic lymphadenopathy. The appendix is not identified. The colon is decompressed. Normal alignment of the lumbar spine. No destructive bone lesions appreciated.  IMPRESSION: March distention of gas in fluid-filled small bowel with transition zone apparently in the right lower quadrant. Changes are consistent with high-grade small bowel obstruction.   Electronically Signed   By: Burman Nieves M.D.   On: 02/24/2013 01:46   Dg Abd 1 View  02/24/2013   CLINICAL DATA:  Abdominal distention. Patient will feet. Abdominal pain.  EXAM: ABDOMEN - 1 VIEW  COMPARISON:  09/25/2011  FINDINGS: Diffuse gaseous distention of small bowel with multiple air-fluid levels. Limited gas in the colon. Supine views would be useful for better delineation of the colon. Changes are worrisome for small bowel obstruction. No free intra-abdominal air. Probable gastric distention is well. No radiopaque stones. Visualized bones appear intact.  IMPRESSION: Gaseous distention  of the small bowel with multiple air-fluid levels suggesting small bowel obstruction.   Electronically Signed   By: Burman Nieves M.D.   On: 02/24/2013 00:07   Dg Basil Dess Tube Plc W/fl W/rad  02/24/2013   CLINICAL DATA:  Nasogastric tube placement.  Bowel obstruction.  EXAM: NASO G TUBE PLACEMENT WITH FL AND WITH  RAD  COMPARISON:  None.  FLUOROSCOPY TIME:  10 seconds  FINDINGS: Despite sedation and presence of patient's brother, patient would not allow nasogastric tube tube to be placed. One attempt with 38 [place] and 3 attempts with 30 [place] were not successful.  IMPRESSION: Unsuccessful attempt at placement of nasogastric tube.   Electronically Signed   By: Bridgett Larsson M.D.   On: 02/24/2013 11:11    Assessment / Plan: 1.  Small bowel obstruction  Check CBC, BMET this AM  Change IVF  AXR in AM 11/29  Monitor closely  Family / Sitter as needed  Velora Heckler, MD, [facility] Office: [phone]  02/25/2013

## 2013-02-25 NOTE — Progress Notes (Signed)
Report called to Florentina Addison, Charity fundraiser.  Transported via wheelchair to room 1532.  Brother at the patient's side.

## 2013-02-25 NOTE — Progress Notes (Signed)
57yo WM pt of [facility] admitted 02/23/2013 with SBO.  Pt with h/o autism spectrum disorder, mental retardation, hyperlipidemia, osteoporosis, and multiple SBO in the past.  [facility] [place], but is down visiting family for an extended period for the holidays.  Pt originally in room 1239, but due to pt frequently getting OOB, pt was moved to room 1224 for higher visibility and accessibility to better ensure pt safety.  Will call pt's family prior to shift change at 7am to update location.  Will cont to monitor pt status. Marrion Coy RN

## 2013-02-26 ENCOUNTER — Inpatient Hospital Stay (HOSPITAL_COMMUNITY): Payer: Medicare Other

## 2013-02-26 MED ORDER — BISACODYL 10 MG RE SUPP: 10.0000 mg | Freq: Once | RECTAL | Status: AC | PRN

## 2013-02-26 MED ORDER — BISACODYL 10 MG RE SUPP
10.0000 mg | Freq: Once | RECTAL | Status: AC
  Administered 2013-02-26: 10 mg via RECTAL
  Filled 2013-02-26: qty 1

## 2013-02-26 NOTE — Progress Notes (Signed)
Patient ID: Richard [name], male   DOB: 1955/06/19, 57 y.o.   MRN: [id number]  General Surgery - [facility] - Progress Note  Subjective: Patient appears brighter, more interactive.  Passing flatus overnight, no BM yet.  Brother at bedside.  Objective: Vital signs in last 24 hours: Temp:  [98 F (36.7 C)-98.4 F (36.9 C)] 98 F (36.7 C) (11/29 0618) Pulse Rate:  [65-81] 66 (11/29 0639) Resp:  [17-20] 18 (11/29 0639) BP: (126-174)/(74-98) 126/74 mmHg (11/29 0618) SpO2:  [90 %-97 %] 93 % (11/29 0639) Last BM Date:  (unknown)  Intake/Output from previous day: 11/28 0701 - 11/29 0700 In: 2112.5 [I.V.:2062.5; IV Piggyback:50] Out: -   Exam: HEENT - clear, not icteric Neck - soft Chest - clear bilaterally Cor - RRR, no murmur Abd - softer, not distended; BS present; no apparent tenderness Ext - no significant edema  Lab Results:   Recent Labs  02/24/13 0757 02/25/13 0807  WBC 16.2* 9.8  HGB 15.5 13.6  HCT 44.2 39.7  PLT 183 181     Recent Labs  02/24/13 0757 02/25/13 0807  NA 131* 138  K 5.2* 4.3  CL 96 106  CO2 23 22  GLUCOSE 138* 91  BUN 20 17  CREATININE 1.56* 1.72*  CALCIUM 8.8 8.6    Studies/Results: Dg Naso G Tube Plc W/fl W/rad  02/24/2013   CLINICAL DATA:  Nasogastric tube placement.  Bowel obstruction.  EXAM: NASO G TUBE PLACEMENT WITH FL AND WITH RAD  COMPARISON:  None.  FLUOROSCOPY TIME:  10 seconds  FINDINGS: Despite sedation and presence of patient's brother, patient would not allow nasogastric tube tube to be placed. One attempt with 11 [place] and 3 attempts with 24 [place] were not successful.  IMPRESSION: Unsuccessful attempt at placement of nasogastric tube.   Electronically Signed   By: Bridgett Larsson M.D.   On: 02/24/2013 11:11    Assessment / Plan: 1.  Small bowel obstruction  Check AXR this AM when completed  Dulcolax supp today  Continue to assess closely - may yet require laparotomy  Velora Heckler, MD,  [facility] Office: [phone]  02/26/2013

## 2013-02-26 NOTE — Progress Notes (Addendum)
General Surgery [facility]  AXR reviewed with patient's brother.  Appears to have some improvement with contrast and air into distal colon and fewer dilated loops of small bowel and fewer air-fluid levels present.  Will try dulcolax supp today.  Encourage OOB and ambulation.  Velora Heckler, MD, [facility] Office: [phone]

## 2013-02-26 NOTE — Evaluation (Signed)
Physical Therapy Evaluation Patient Details Name: Richard [name] MRN: [id number] DOB: 01-22-1956 Today's Date: 02/26/2013 Time: 7829-5621 PT Time Calculation (min): 16 min  PT Assessment / Plan / Recommendation History of Present Illness  the patient is a 57 year old male with an altered Spectrum disorder who is nonverbal. I evaluated him with the help of his caregiver. He has been living in [place] recently had an only been down in [place] for the past couple weeks. The patient's caregiver states that he has been sick for the past couple days. He has had some constipation. Today he refused to eat and that is what prompted his caregivers to bring him to the emergency department, as this was not normal for him. They deny any fevers. His last bowel movement was this evening after dinner. He has not been vomiting.  His caregiver reports that he had 2 abdominal surgeries in the past. The stent a twisting of the bowel but sounds like a volvulus effect the surgery was done to tack the bowel to his abdominal wall to prevent twisting in the future. The surgeries were all done in [place]  Clinical Impression  Pt followed simple commands to get OOB and ambulation in hall. Pt did not demo any loss of balance. Pt can ambulate with any personnel or caregivers. Pt will benefit from PT to assist with ambulation.    PT Assessment  Patient needs continued PT services    Follow Up Recommendations  No PT follow up    Does the patient have the potential to tolerate intense rehabilitation      Barriers to Discharge        Equipment Recommendations  None recommended by PT    Recommendations for Other Services     Frequency Min 3X/week    Precautions / Restrictions Precautions Precautions: Fall   Pertinent Vitals/Pain       Mobility  Bed Mobility Bed Mobility: Supine to Sit;Sit to Supine Supine to Sit: 5: Supervision Sit to Supine: 5: Supervision Transfers Transfers: Sit to Stand;Stand to  Sit Sit to Stand: 5: Supervision;From bed;From toilet Stand to Sit: To bed;To toilet Details for Transfer Assistance: pt understood  simple commands  to get OOB and ambulate, also went t into bathroom  and urinated after door opened. Ambulation/Gait Ambulation/Gait Assistance: 4: Min guard Ambulation Distance (Feet): 400 Feet Assistive device: None Ambulation/Gait Assistance Details: no balance losses noted. Gait Pattern: Step-through pattern Gait velocity: wfl-speedy    Exercises     PT Diagnosis: Generalized weakness  PT Problem List: Decreased activity tolerance;Decreased mobility PT Treatment Interventions: Gait training;Functional mobility training;Therapeutic activities     PT Goals(Current goals can be found in the care plan section) Acute Rehab PT Goals PT Goal Formulation: Patient unable to participate in goal setting  Visit Information  Last PT Received On: 02/26/13 Assistance Needed: +1 History of Present Illness: the patient is a 57 year old male with an altered Spectrum disorder who is nonverbal. I evaluated him with the help of his caregiver. He has been living in [place] recently had an only been down in [place] for the past couple weeks. The patient's caregiver states that he has been sick for the past couple days. He has had some constipation. Today he refused to eat and that is what prompted his caregivers to bring him to the emergency department, as this was not normal for him. They deny any fevers. His last bowel movement was this evening after dinner. He has not been vomiting.  His caregiver  reports that he had 2 abdominal surgeries in the past. The stent a twisting of the bowel but sounds like a volvulus effect the surgery was done to tack the bowel to his abdominal wall to prevent twisting in the future. The surgeries were all done in [place]       Prior Functioning  Home Living Family/patient expects to be discharged to:: Unsure Available Help at  Discharge: Personal care attendant;Available 24 hours/day Additional Comments: no caregiver is present to obtain info in regards to the living situation. Prior Function Comments: has cargivers, ? level if independence re: ADL's. Pt is non communicative but does follow simple gesturing. Communication Communication: Expressive difficulties    Cognition  Cognition Arousal/Alertness: Awake/alert Behavior During Therapy: WFL for tasks assessed/performed Overall Cognitive Status: History of cognitive impairments - at baseline    Extremity/Trunk Assessment Upper Extremity Assessment Upper Extremity Assessment: Overall WFL for tasks assessed Lower Extremity Assessment Lower Extremity Assessment: Overall WFL for tasks assessed Cervical / Trunk Assessment Cervical / Trunk Assessment: Normal   Balance Balance Balance Assessed: Yes Static Standing Balance Static Standing - Balance Support: No upper extremity supported Static Standing - Level of Assistance: 7: Independent Dynamic Standing Balance Dynamic Standing - Balance Support: No upper extremity supported Dynamic Standing - Level of Assistance: 5: Stand by assistance Dynamic Standing - Comments: turns, backing up,   End of Session PT - End of Session Activity Tolerance: Patient tolerated treatment well Patient left: in bed;with nursing/sitter in room Nurse Communication: Mobility status  GP     Rada Hay 02/26/2013, 2:24 PM Blanchard Kelch PT [phone]

## 2013-02-27 MED ORDER — ACETAMINOPHEN 325 MG PO TABS
650.0000 mg | ORAL_TABLET | ORAL | Status: DC | PRN
  Administered 2013-02-27 – 2013-02-28 (×3): 650 mg via ORAL
  Filled 2013-02-27 (×3): qty 2

## 2013-02-27 NOTE — Progress Notes (Signed)
Patient ID: Richard [name], male   DOB: September 29, 1955, 57 y.o.   MRN: [id number]  General Surgery - [facility] - Progress Note  Subjective: Patient comfortable in bed.  Brother at bedside.  No nausea or emesis.  Passing flatus.  No BM's.  Objective: Vital signs in last 24 hours: Temp:  [97.7 F (36.5 C)-98.1 F (36.7 C)] 98.1 F (36.7 C) (11/30 0432) Pulse Rate:  [59-61] 60 (11/30 0432) Resp:  [18] 18 (11/30 0432) BP: (126-135)/(75-85) 135/85 mmHg (11/30 0432) SpO2:  [92 %-97 %] 92 % (11/30 0432) Last BM Date:  (unknown)  Intake/Output from previous day: 03-04-2023 0701 - 11/30 0700 In: 3562.5 [I.V.:3562.5] Out: -   Exam: HEENT - clear, not icteric Neck - soft Chest - clear bilaterally Cor - RRR, no murmur Abd - soft without distension; BS present; no mass; no apparent tenderness Ext - no significant edema  Lab Results:   Recent Labs  02/24/13 0757 02/25/13 0807  WBC 16.2* 9.8  HGB 15.5 13.6  HCT 44.2 39.7  PLT 183 181     Recent Labs  02/24/13 0757 02/25/13 0807  NA 131* 138  K 5.2* 4.3  CL 96 106  CO2 23 22  GLUCOSE 138* 91  BUN 20 17  CREATININE 1.56* 1.72*  CALCIUM 8.8 8.6    Studies/Results: Dg Abd 2 Views  03-Mar-2013   CLINICAL DATA:  Evaluate abdominal distention  EXAM: ABDOMEN - 2 VIEW  COMPARISON:  02/23/2013; CT abdomen pelvis -02/24/2013  FINDINGS: There has been interval transit of previously ingested enteric contrast, now seen within in the descending and sigmoid colon to the level of the rectum.  There is persistent mild gaseous distention of several loops of small bowel with apparent air-fluid level within the mid abdomen. No pneumoperitoneum, pneumatosis or portal venous gas.  Limited visualization of the lower thorax demonstrates interval development of bibasilar heterogeneous opacities, right greater than left. Trace right-sided effusion is suspected.  Scoliotic curvature of the thoracolumbar spine.  IMPRESSION: 1. Overall  findings suggestive of improving though persistent partial small bowel obstruction. 2. Interval development of bibasilar heterogeneous opacities, right greater than left, possibly atelectasis a worrisome for multifocal infection. Further evaluation with dedicated chest radiographs may be performed as clinically indicated.   Electronically Signed   By: Simonne Come M.D.   On: 03-03-13 08:11    Assessment / Plan: 1.  Small bowel obstruction  AXR improved yesterday with gas and contrast to distal colon  Exam benign - no nausea or emesis  Will begin trial of clear liquids  Encouraged ambulation  Discussed with brother at bedside  Velora Heckler, MD, [facility] Office: [phone]  02/27/2013

## 2013-02-28 MED ORDER — BISACODYL 10 MG RE SUPP
10.0000 mg | Freq: Once | RECTAL | Status: AC
  Administered 2013-02-28: 10 mg via RECTAL
  Filled 2013-02-28: qty 1

## 2013-02-28 NOTE — Progress Notes (Signed)
  Subjective: He is non verbal, sleeping well.  His brother is with him and says he had a good day yesterday.  With 1 BM.    Objective: Vital signs in last 24 hours: Temp:  [98 F (36.7 C)-98.5 F (36.9 C)] 98.5 F (36.9 C) (12/01 0515) Pulse Rate:  [52-54] 53 (12/01 0515) Resp:  [18] 18 (12/01 0515) BP: (109-137)/(65-83) 136/79 mmHg (12/01 0515) SpO2:  [93 %-97 %] 97 % (12/01 0515) Last BM Date: 02/27/13 2400 ML PO recorded yesterday 1 BM Afebrile, VSS No labs today Diet: clears Intake/Output from previous day: 11/30 0701 - 12/01 0700 In: 4240 [P.O.:2400; I.V.:1840] Out: -  Intake/Output this shift:    General appearance: alert, cooperative and no distress Resp: clear to auscultation bilaterally GI: soft nontender, no distension, He stays on his side, few BS.  Lab Results:   Recent Labs  02/25/13 0807  WBC 9.8  HGB 13.6  HCT 39.7  PLT 181    BMET  Recent Labs  02/25/13 0807  NA 138  K 4.3  CL 106  CO2 22  GLUCOSE 91  BUN 17  CREATININE 1.72*  CALCIUM 8.6   PT/INR No results found for this basename: LABPROT, INR,  in the last 72 hours   Recent Labs Lab 02/24/13 0028  AST 20  ALT 15  ALKPHOS 145*  BILITOT 0.4  PROT 8.4*  ALBUMIN 5.0     Lipase     Component Value Date/Time   LIPASE 44 02/24/2013 0028     Studies/Results: Dg Abd 2 Views  03-07-13   CLINICAL DATA:  Evaluate abdominal distention  EXAM: ABDOMEN - 2 VIEW  COMPARISON:  02/23/2013; CT abdomen pelvis -02/24/2013  FINDINGS: There has been interval transit of previously ingested enteric contrast, now seen within in the descending and sigmoid colon to the level of the rectum.  There is persistent mild gaseous distention of several loops of small bowel with apparent air-fluid level within the mid abdomen. No pneumoperitoneum, pneumatosis or portal venous gas.  Limited visualization of the lower thorax demonstrates interval development of bibasilar heterogeneous opacities, right  greater than left. Trace right-sided effusion is suspected.  Scoliotic curvature of the thoracolumbar spine.  IMPRESSION: 1. Overall findings suggestive of improving though persistent partial small bowel obstruction. 2. Interval development of bibasilar heterogeneous opacities, right greater than left, possibly atelectasis a worrisome for multifocal infection. Further evaluation with dedicated chest radiographs may be performed as clinically indicated.   Electronically Signed   By: Simonne Come M.D.   On: Mar 07, 2013 08:11    Medications: . heparin  5,000 Units Subcutaneous Q8H  . ondansetron  4 mg Intravenous Q6H  . PHENObarbital  30 mg Intravenous Q8H  . sodium chloride  1,000 mL Intravenous Once    Assessment/Plan SBO with hx of abdominal surgery Dehydration  Mental retardation Autism spectrum disorder  Hypercholesteremia   Plan:  Full liquids at breakfast and soft diet at lunch repeat Dulcolax supp at lunch if no  BM prior.  I have ask brother to get him up and walk some this AM. He stays in bed allot now.  He will be here with [place] in [place] for a couple months, and will need cataract surgery in DEC.  Family wants to make sure he is doing well before he goes home.  He is rather difficult to read.       LOS: 5 days    Saber Dickerman 02/28/2013

## 2013-03-01 LAB — BASIC METABOLIC PANEL
CO2: 26 mEq/L (ref 19–32)
Calcium: 8.7 mg/dL (ref 8.4–10.5)
GFR calc non Af Amer: 46 mL/min — ABNORMAL LOW (ref >90)
Potassium: 4.4 mEq/L (ref 3.5–5.1)
Sodium: 139 mEq/L (ref 135–145)

## 2013-03-01 MED ORDER — ADULT MULTIVITAMIN W/MINERALS CH
1.0000 | ORAL_TABLET | Freq: Every day | ORAL | Status: DC
  Administered 2013-03-01: 1 via ORAL
  Filled 2013-03-01: qty 1

## 2013-03-01 MED ORDER — ACETAMINOPHEN 325 MG PO TABS: 650.0000 mg | ORAL_TABLET | ORAL | Status: DC | PRN

## 2013-03-01 MED ORDER — SIMVASTATIN 40 MG PO TABS: 40.0000 mg | ORAL_TABLET | Freq: Every evening | ORAL | Status: DC

## 2013-03-01 MED ORDER — DOCUSATE SODIUM 100 MG PO CAPS
100.0000 mg | ORAL_CAPSULE | Freq: Every day | ORAL | Status: DC
  Administered 2013-03-01: 100 mg via ORAL
  Filled 2013-03-01: qty 1

## 2013-03-01 MED ORDER — TRAZODONE HCL 50 MG PO TABS
50.0000 mg | ORAL_TABLET | Freq: Every evening | ORAL | Status: DC | PRN
  Filled 2013-03-01: qty 2

## 2013-03-01 MED ORDER — VITAMIN D3 25 MCG (1000 UNIT) PO TABS
2000.0000 [IU] | ORAL_TABLET | Freq: Every day | ORAL | Status: DC
Start: 2013-03-01 — End: 2013-03-01
  Administered 2013-03-01: 2000 [IU] via ORAL
  Filled 2013-03-01: qty 2

## 2013-03-01 MED ORDER — PHENOBARBITAL 32.4 MG PO TABS
32.4000 mg | ORAL_TABLET | Freq: Every day | ORAL | Status: DC
  Administered 2013-03-01: 32.4 mg via ORAL
  Filled 2013-03-01: qty 1

## 2013-03-01 MED ORDER — CALCIUM CARBONATE-VITAMIN D 500-200 MG-UNIT PO TABS
1.0000 | ORAL_TABLET | Freq: Two times a day (BID) | ORAL | Status: DC
  Administered 2013-03-01: 1 via ORAL
  Filled 2013-03-01 (×3): qty 1

## 2013-03-01 MED ORDER — MULTI-VITAMIN/MINERALS PO TABS: 1.0000 | ORAL_TABLET | Freq: Every day | ORAL | Status: DC

## 2013-03-01 MED ORDER — PHENOBARBITAL 32.4 MG PO TABS: 64.8000 mg | ORAL_TABLET | Freq: Every day | ORAL | Status: DC

## 2013-03-01 NOTE — Discharge Summary (Signed)
Physician Discharge Summary  Patient ID: Richard [name] MRN: [id number] DOB/AGE: 08-26-1955 57 y.o.  Admit date: 02/23/2013 Discharge date: 03/01/2013  Admission Diagnoses: SBO with hx of abdominal surgery  Dehydration  Mild renal insuffiencey  Mental retardation  Autism spectrum disorder  Hypercholesteremia    Discharge Diagnoses: Same Active Problems:   SBO (small bowel obstruction)   PROCEDURES: none  Hospital Course: the patient is a 57 year old male with an altered Spectrum disorder who is nonverbal. I evaluated him with the help of his caregiver. He has been living in [place] recently had an only been down in [place] for the past couple weeks. The patient's caregiver states that he has been sick for the past couple days. He has had some constipation. Today he refused to eat and that is what prompted his caregivers to bring him to the emergency department, as this was not normal for him. They deny any fevers. His last bowel movement was this evening after dinner. He has not been vomiting. His caregiver reports that he had 2 abdominal surgeries in the past. The stent a twisting of the bowel but sounds like a volvulus effect the surgery was done to tack the bowel to his abdominal wall to prevent twisting in the future. The surgeries were all done in [place].  CT showed a SBO, labs showed an elevated WBC, dehydration and abnormal creatinine.  He was admitted and placed on bowel rest, antibiotics, and IV hydration.  He progressed over the next 72 hours, his diet was advanced and he was able to be discharged home on 03/01/13.           Disposition: 01-Home or Self Care     Medication List         acetaminophen 325 MG tablet  Commonly known as:  TYLENOL  Take 2 tablets (650 mg total) by mouth every 4 (four) hours as needed for mild pain, fever or headache.     calcium-vitamin D 500-200 MG-UNIT per tablet  Commonly known as:  OSCAL WITH D  Take 1 tablet by mouth 2  (two) times daily.     cholecalciferol 1000 UNITS tablet  Commonly known as:  VITAMIN D  Take 2,000 Units by mouth daily.     docusate sodium 100 MG capsule  Commonly known as:  COLACE  Take 100 mg by mouth daily. Patient is given this medication to help with constipation.     multivitamin with minerals tablet  Take 1 tablet by mouth daily.     PHENobarbital 32.4 MG tablet  Commonly known as:  LUMINAL  Take 32.4 mg by mouth daily at 12 noon.     PHENobarbital 64.8 MG tablet  Commonly known as:  LUMINAL  Take 64.8 mg by mouth every evening.     simvastatin 40 MG tablet  Commonly known as:  ZOCOR  Take 40 mg by mouth every evening.     traZODone 50 MG tablet  Commonly known as:  DESYREL  Take 50-100 mg by mouth at bedtime as needed for sleep.           Follow-up Information   Follow up with METHENEY,CATHERINE, MD. (I would call them if there are any issues with his medicines.)    Specialty:  Family Medicine   Contact information:   [address] [place] [address] [phone]       Call Vanita Panda., MD. (If you have an issue again or come back to the Emergency  department. as needed)    Specialty:  General Surgery   Contact information:   [address], [address] [place] [address] [phone]       Signed: Sherrie George 03/01/2013, 7:08 PM

## 2013-03-01 NOTE — Discharge Instructions (Signed)
Intestinal Obstruction °Care After °Please read the instructions outlined below. Refer to these instructions for the next few weeks. These discharge instructions provide you with general information on caring for yourself after surgery. Your caregiver may also give you specific instructions. While your treatment has been planned according to the most current medical practices available, unavoidable complications occasionally occur. If you have any problems or questions after discharge, please call your caregiver. °HOME CARE INSTRUCTIONS  °· It is normal to be sore for a couple weeks following surgery. See your caregiver if this seems to be getting worse rather than better. °· Take prescribed medicine as directed. Only take over-the-counter or prescription medicines for pain, discomfort, and or fever, as directed by your caregiver. °· Use showers for bathing, until seen or as instructed. °· Change dressings if necessary or as directed. °· You may resume normal diet and activities as directed or allowed. °· Liquids and soft foods may be easier to digest at first. Once you can tolerate liquid and soft foods, you can begin eating regular solid foods. °· Avoid lifting or driving until you are instructed otherwise. °· Make an appointment to see your caregiver for suture (stitches) or staple removal when instructed. °· You may use ice on your incision for 15-20 minutes, 03-04 times per day for the first two days. °SEEK MEDICAL CARE IF:  °· You see redness, swelling, or have increasing pain in wounds. °· You have pus coming from your wound. °· You develop an unexplained temperature over 102° F (38.9° C). °· You have a bad smell coming from the wound or dressing. °· You develop lightheadedness or feel faint. °SEEK IMMEDIATE MEDICAL CARE IF:  °· You have increased bleeding from wounds. °· You have increasing abdominal pain, repeated vomiting, dehydration or fainting. °· You develop severe weakness, chest pain or back  pain °· You develop blood in your vomit, stool or you have tarry stool. °· You develop a rash. °· You have difficulty breathing. °· You develop any reaction or side effects to medicines given. °Document Released: 10/04/2004 Document Revised: 06/09/2011 Document Reviewed: 04/20/2007 °ExitCare® Patient Information ©2014 [employer]. ° °

## 2013-03-01 NOTE — Progress Notes (Signed)
  Subjective: He seems to be doing well.  Sister in law is with him today, walking halls.  He seems to be doing well.  Objective: Vital signs in last 24 hours: Temp:  [97.5 F (36.4 C)-98.1 F (36.7 C)] 98.1 F (36.7 C) (12/02 0500) Pulse Rate:  [56-63] 57 (12/02 0500) Resp:  [18] 18 (12/02 0500) BP: (132-160)/(68-89) 132/68 mmHg (12/02 0500) SpO2:  [95 %-99 %] 95 % (12/02 0500) Last BM Date: 02/28/13 3120 ml PO recorded yesterday 2 stools recorded Low fiber diet Afebrile VSS Intake/Output from previous day: 12/01 0701 - 12/02 0700 In: 4020 [P.O.:3120; I.V.:900] Out: -  Intake/Output this shift:    General appearance: alert, cooperative and no distress Resp: clear to auscultation bilaterally GI: soft, non-tender; bowel sounds normal; no masses,  no organomegaly  Lab Results:  No results found for this basename: WBC, HGB, HCT, PLT,  in the last 72 hours  BMET No results found for this basename: NA, K, CL, CO2, GLUCOSE, BUN, CREATININE, CALCIUM,  in the last 72 hours PT/INR No results found for this basename: LABPROT, INR,  in the last 72 hours   Recent Labs Lab 02/24/13 0028  AST 20  ALT 15  ALKPHOS 145*  BILITOT 0.4  PROT 8.4*  ALBUMIN 5.0     Lipase     Component Value Date/Time   LIPASE 44 02/24/2013 0028     Studies/Results: No results found.  Medications: . heparin  5,000 Units Subcutaneous Q8H  . ondansetron  4 mg Intravenous Q6H  . PHENObarbital  30 mg Intravenous Q8H  . sodium chloride  1,000 mL Intravenous Once    Assessment/Plan SBO with hx of abdominal surgery  Dehydration  Mild renal insuffiencey  Mental retardation  Autism spectrum disorder  Hypercholesteremia    Plan:  I will recheck his BMP, let him eat breakfast and lunch.  Put him back on his home medicines.  If he does well let him go home later today.  LOS: 6 days    Jayesh Marbach 03/01/2013

## 2013-03-04 ENCOUNTER — Telehealth: Payer: Self-pay

## 2013-03-04 NOTE — Telephone Encounter (Signed)
Ok to stop colace and see if bowels slow down a bit. If notices any blood or develops fever or stools become more watery and frequent then please let us know.

## 2013-03-04 NOTE — Telephone Encounter (Signed)
Richard [name] was in the hospital for a small bowel obstruction. In the last 24 hours he has had diarrhea 2 times soft stool 3 times. Should they be concerned? He is still taking the colace.

## 2013-03-07 NOTE — Telephone Encounter (Signed)
Pt's sister called back and stated that they d/c'd colace on Friday and his BM's are soft, and has mucus and she would like to know what she should do, he has been noticeably agitated please advise ?

## 2013-03-07 NOTE — Telephone Encounter (Signed)
He needs a hospital followup appointment. Also see if he is taking antibiotics. This could be a side effect from antibiotics.

## 2013-03-07 NOTE — Telephone Encounter (Signed)
appt made for 03/15/13.Laureen Ochs, Viann Shove

## 2013-03-09 ENCOUNTER — Telehealth (INDEPENDENT_AMBULATORY_CARE_PROVIDER_SITE_OTHER): Payer: Self-pay

## 2013-03-09 NOTE — Telephone Encounter (Signed)
Pts sister called to ask how many BMs pt should have daily. She also wanted to know when to worry. She states pt is having one BM yesterday and today. No bloating. No vomiting. She states pt does not show signs of discomfort and tolerating diet well. She is advised pt may have one or 2 BMs daily and this may be normal for him at this time. I advised her to watch for constipation or multiple loose stools. She is also advised to watch for abd bloating and or vomiting. She states she understands and will call with any concerns.

## 2013-03-14 ENCOUNTER — Ambulatory Visit: Payer: Medicare Other | Admitting: Family Medicine

## 2013-03-14 HISTORY — PX: EYE SURGERY: SHX253

## 2013-03-15 ENCOUNTER — Ambulatory Visit: Payer: Medicare Other | Admitting: Family Medicine

## 2013-03-16 ENCOUNTER — Encounter: Payer: Self-pay | Admitting: Family Medicine

## 2013-03-16 ENCOUNTER — Encounter (HOSPITAL_COMMUNITY): Payer: Self-pay | Admitting: Emergency Medicine

## 2013-03-16 ENCOUNTER — Ambulatory Visit (INDEPENDENT_AMBULATORY_CARE_PROVIDER_SITE_OTHER): Payer: Medicare Other | Admitting: Family Medicine

## 2013-03-16 ENCOUNTER — Inpatient Hospital Stay (HOSPITAL_COMMUNITY)
Admission: EM | Admit: 2013-03-16 | Discharge: 2013-03-18 | DRG: 389 | Disposition: A | Discharge status: Home / Self Care | Payer: Medicare Other | Attending: Internal Medicine | Admitting: Internal Medicine

## 2013-03-16 ENCOUNTER — Ambulatory Visit (INDEPENDENT_AMBULATORY_CARE_PROVIDER_SITE_OTHER): Payer: Medicare Other

## 2013-03-16 VITALS — BP 161/112 | HR 116 | Wt 182.0 lb

## 2013-03-16 DIAGNOSIS — K567 Ileus, unspecified: Secondary | ICD-10-CM | POA: Diagnosis present

## 2013-03-16 DIAGNOSIS — R140 Abdominal distension (gaseous): Secondary | ICD-10-CM

## 2013-03-16 DIAGNOSIS — Z79899 Other long term (current) drug therapy: Secondary | ICD-10-CM

## 2013-03-16 DIAGNOSIS — E7800 Pure hypercholesterolemia, unspecified: Secondary | ICD-10-CM | POA: Diagnosis present

## 2013-03-16 DIAGNOSIS — N183 Chronic kidney disease, stage 3 unspecified: Secondary | ICD-10-CM | POA: Diagnosis present

## 2013-03-16 DIAGNOSIS — K560 Paralytic ileus: Principal | ICD-10-CM | POA: Diagnosis present

## 2013-03-16 DIAGNOSIS — Z888 Allergy status to other drugs, medicaments and biological substances status: Secondary | ICD-10-CM

## 2013-03-16 DIAGNOSIS — G40909 Epilepsy, unspecified, not intractable, without status epilepticus: Secondary | ICD-10-CM | POA: Diagnosis present

## 2013-03-16 DIAGNOSIS — R141 Gas pain: Secondary | ICD-10-CM

## 2013-03-16 DIAGNOSIS — F840 Autistic disorder: Secondary | ICD-10-CM | POA: Diagnosis present

## 2013-03-16 DIAGNOSIS — N179 Acute kidney failure, unspecified: Secondary | ICD-10-CM | POA: Diagnosis present

## 2013-03-16 DIAGNOSIS — Z9849 Cataract extraction status, unspecified eye: Secondary | ICD-10-CM

## 2013-03-16 DIAGNOSIS — F79 Unspecified intellectual disabilities: Secondary | ICD-10-CM | POA: Diagnosis present

## 2013-03-16 DIAGNOSIS — M810 Age-related osteoporosis without current pathological fracture: Secondary | ICD-10-CM | POA: Diagnosis present

## 2013-03-16 DIAGNOSIS — E785 Hyperlipidemia, unspecified: Secondary | ICD-10-CM | POA: Diagnosis present

## 2013-03-16 DIAGNOSIS — K5900 Constipation, unspecified: Secondary | ICD-10-CM

## 2013-03-16 DIAGNOSIS — R109 Unspecified abdominal pain: Secondary | ICD-10-CM

## 2013-03-16 DIAGNOSIS — Z8669 Personal history of other diseases of the nervous system and sense organs: Secondary | ICD-10-CM

## 2013-03-16 DIAGNOSIS — E860 Dehydration: Secondary | ICD-10-CM

## 2013-03-16 DIAGNOSIS — Z87898 Personal history of other specified conditions: Secondary | ICD-10-CM

## 2013-03-16 HISTORY — DX: Disorder of thyroid, unspecified: E07.9

## 2013-03-16 LAB — URINALYSIS, ROUTINE W REFLEX MICROSCOPIC
Ketones, ur: NEGATIVE mg/dL
Leukocytes, UA: NEGATIVE
Protein, ur: 30 mg/dL — AB
Specific Gravity, Urine: 1.01 (ref 1.005–1.030)
Urobilinogen, UA: 0.2 mg/dL (ref 0.0–1.0)

## 2013-03-16 LAB — CBC WITH DIFFERENTIAL/PLATELET
Basophils Relative: 0 % (ref 0–1)
HCT: 44.2 % (ref 39.0–52.0)
Hemoglobin: 15.6 g/dL (ref 13.0–17.0)
Lymphocytes Relative: 11 % — ABNORMAL LOW (ref 12–46)
Lymphs Abs: 1.6 10*3/uL (ref 0.7–4.0)
MCHC: 35.3 g/dL (ref 30.0–36.0)
Monocytes Absolute: 1 10*3/uL (ref 0.1–1.0)
Monocytes Relative: 7 % (ref 3–12)
Neutro Abs: 11.5 10*3/uL — ABNORMAL HIGH (ref 1.7–7.7)
Neutrophils Relative %: 82 % — ABNORMAL HIGH (ref 43–77)
RBC: 4.74 MIL/uL (ref 4.22–5.81)
RDW: 12.3 % (ref 11.5–15.5)
WBC: 14.1 10*3/uL — ABNORMAL HIGH (ref 4.0–10.5)

## 2013-03-16 LAB — COMPREHENSIVE METABOLIC PANEL
ALT: 14 U/L (ref 0–53)
AST: 23 U/L (ref 0–37)
Calcium: 9.9 mg/dL (ref 8.4–10.5)
Chloride: 104 mEq/L (ref 96–112)
Creatinine, Ser: 1.85 mg/dL — ABNORMAL HIGH (ref 0.50–1.35)
GFR calc Af Amer: 46 mL/min — ABNORMAL LOW (ref >90)
Glucose, Bld: 104 mg/dL — ABNORMAL HIGH (ref 70–99)
Sodium: 140 mEq/L (ref 135–145)
Total Bilirubin: 0.3 mg/dL (ref 0.3–1.2)
Total Protein: 7.1 g/dL (ref 6.0–8.3)

## 2013-03-16 LAB — MAGNESIUM: Magnesium: 1.8 mg/dL (ref 1.5–2.5)

## 2013-03-16 LAB — URINE MICROSCOPIC-ADD ON

## 2013-03-16 MED ORDER — PREDNISOLONE ACETATE 1 % OP SUSP
1.0000 [drp] | Freq: Four times a day (QID) | OPHTHALMIC | Status: DC
  Filled 2013-03-16: qty 1

## 2013-03-16 MED ORDER — ACETAMINOPHEN 325 MG PO TABS
650.0000 mg | ORAL_TABLET | ORAL | Status: DC | PRN
  Administered 2013-03-17: 650 mg via ORAL
  Filled 2013-03-16: qty 2

## 2013-03-16 MED ORDER — POLYETHYLENE GLYCOL 3350 17 G PO PACK
17.0000 g | PACK | Freq: Every day | ORAL | Status: DC
  Administered 2013-03-16: 17 g via ORAL
  Filled 2013-03-16 (×2): qty 1

## 2013-03-16 MED ORDER — SIMVASTATIN 40 MG PO TABS
40.0000 mg | ORAL_TABLET | Freq: Every evening | ORAL | Status: DC
  Administered 2013-03-16 – 2013-03-17 (×2): 40 mg via ORAL
  Filled 2013-03-16 (×3): qty 1

## 2013-03-16 MED ORDER — SODIUM CHLORIDE 0.9 % IV BOLUS (SEPSIS)
1000.0000 mL | Freq: Once | INTRAVENOUS | Status: AC
  Administered 2013-03-16: 1000 mL via INTRAVENOUS

## 2013-03-16 MED ORDER — KETOROLAC TROMETHAMINE 30 MG/ML IJ SOLN
15.0000 mg | Freq: Once | INTRAMUSCULAR | Status: AC
  Administered 2013-03-16: 15 mg via INTRAVENOUS
  Filled 2013-03-16: qty 1

## 2013-03-16 MED ORDER — MOXIFLOXACIN HCL 0.5 % OP SOLN
1.0000 [drp] | Freq: Four times a day (QID) | OPHTHALMIC | Status: DC
  Administered 2013-03-17 – 2013-03-18 (×6): 1 [drp] via OPHTHALMIC

## 2013-03-16 MED ORDER — HEPARIN SODIUM (PORCINE) 5000 UNIT/ML IJ SOLN
5000.0000 [IU] | Freq: Three times a day (TID) | INTRAMUSCULAR | Status: DC
  Administered 2013-03-16 – 2013-03-18 (×5): 5000 [IU] via SUBCUTANEOUS
  Filled 2013-03-16 (×8): qty 1

## 2013-03-16 MED ORDER — SENNOSIDES-DOCUSATE SODIUM 8.6-50 MG PO TABS
1.0000 | ORAL_TABLET | Freq: Two times a day (BID) | ORAL | Status: DC
  Administered 2013-03-16 – 2013-03-18 (×3): 1 via ORAL
  Filled 2013-03-16 (×5): qty 1

## 2013-03-16 MED ORDER — PHENOBARBITAL 32.4 MG PO TABS
64.8000 mg | ORAL_TABLET | Freq: Every evening | ORAL | Status: DC
  Administered 2013-03-16 – 2013-03-17 (×2): 64.8 mg via ORAL
  Filled 2013-03-16 (×2): qty 2

## 2013-03-16 MED ORDER — MORPHINE SULFATE 2 MG/ML IJ SOLN: 2.0000 mg | INTRAMUSCULAR | Status: DC | PRN

## 2013-03-16 MED ORDER — TRAZODONE HCL 50 MG PO TABS
50.0000 mg | ORAL_TABLET | Freq: Every evening | ORAL | Status: DC | PRN
  Administered 2013-03-17: 100 mg via ORAL
  Filled 2013-03-16: qty 2

## 2013-03-16 MED ORDER — ONDANSETRON HCL 4 MG PO TABS: 4.0000 mg | ORAL_TABLET | Freq: Four times a day (QID) | ORAL | Status: DC | PRN

## 2013-03-16 MED ORDER — SODIUM CHLORIDE 0.9 % IV SOLN
INTRAVENOUS | Status: DC
  Administered 2013-03-16: 18:00:00 via INTRAVENOUS

## 2013-03-16 MED ORDER — BISACODYL 10 MG RE SUPP: 10.0000 mg | Freq: Every day | RECTAL | Status: DC | PRN

## 2013-03-16 MED ORDER — PREDNISOLONE ACETATE 1 % OP SUSP
1.0000 [drp] | OPHTHALMIC | Status: DC
  Administered 2013-03-16 – 2013-03-18 (×10): 1 [drp] via OPHTHALMIC

## 2013-03-16 MED ORDER — ONDANSETRON HCL 4 MG/2ML IJ SOLN: 4.0000 mg | Freq: Four times a day (QID) | INTRAMUSCULAR | Status: DC | PRN

## 2013-03-16 MED ORDER — GATIFLOXACIN 0.5 % OP SOLN
1.0000 [drp] | Freq: Four times a day (QID) | OPHTHALMIC | Status: DC
  Filled 2013-03-16: qty 2.5

## 2013-03-16 MED ORDER — MILK AND MOLASSES ENEMA
1.0000 | Freq: Once | RECTAL | Status: AC
  Administered 2013-03-16: 250 mL via RECTAL
  Filled 2013-03-16: qty 250

## 2013-03-16 MED ORDER — PHENOBARBITAL 32.4 MG PO TABS
32.4000 mg | ORAL_TABLET | Freq: Every day | ORAL | Status: DC
  Administered 2013-03-17 – 2013-03-18 (×2): 32.4 mg via ORAL
  Filled 2013-03-16 (×2): qty 1

## 2013-03-16 MED ORDER — BUTALBITAL-APAP-CAFFEINE 50-325-40 MG PO TABS
1.0000 | ORAL_TABLET | Freq: Once | ORAL | Status: AC
  Administered 2013-03-16: 1 via ORAL
  Filled 2013-03-16: qty 1

## 2013-03-16 MED ORDER — SODIUM CHLORIDE 0.9 % IV SOLN
INTRAVENOUS | Status: DC
  Administered 2013-03-16 – 2013-03-17 (×2): via INTRAVENOUS

## 2013-03-16 NOTE — ED Notes (Addendum)
Pt sent by PCP after an x-ray.  R/o small bowel obstruction.  Pt does not speak.  Hx of obstruction x 3 weeks ago.

## 2013-03-16 NOTE — ED Provider Notes (Signed)
CSN: [id number]     Arrival date & time 03/16/13  1307 History   First MD Initiated Contact with Patient 03/16/13 1501     Chief Complaint  Patient presents with  . Abdominal Pain   (Consider location/radiation/quality/duration/timing/severity/associated sxs/prior Treatment) HPI Comments: 57 year old male presents with his caregiver for worsening abdominal pain and headache. This has been going on for the past 4-5 days. His last bowel movement was 4 days ago and was normal. For that he has had some diarrhea. About 8 or 9 days ago he had surgery on his eye where he had general anesthesia. He is nonverbal and the caregiver fell he is having some migraines but the normal treatments for his migraines has not helped. He has not had any fevers or chills. He is eating less but is not vomiting. The caregiver feels that the patient's pain is coming from his headache as he has been clutching his head.   Past Medical History  Diagnosis Date  . Autism spectrum disorder   . Hypercholesteremia   . Thyroid condition   . Bowel obstruction   . Broken leg Left  . Osteoporosis   . Mental retardation   . Hyperlipidemia   . Malnutrition    Past Surgical History  Procedure Laterality Date  . Abdominal surgery    . Leg surgery    . Eye surgery Left 03-14-2013    catatract   Family History  Problem Relation Age of Onset  . Hypertension Other   . Diabetes Other   . Hyperlipidemia Other   . Scoliosis Other    History  Substance Use Topics  . Smoking status: Never Smoker   . Smokeless tobacco: Not on file  . Alcohol Use: No    Review of Systems  Unable to perform ROS: Patient nonverbal  Gastrointestinal: Positive for abdominal pain.    Allergies  Ofloxacin and Phenothiazines  Home Medications   Current Outpatient Rx  Name  Route  Sig  Dispense  Refill  . acetaminophen (TYLENOL) 325 MG tablet   Oral   Take 2 tablets (650 mg total) by mouth every 4 (four) hours as needed for mild pain,  fever or headache.         . calcium-vitamin D (OSCAL WITH D) 500-200 MG-UNIT per tablet   Oral   Take 1 tablet by mouth 2 (two) times daily.         . cholecalciferol (VITAMIN D) 1000 UNITS tablet   Oral   Take 2,000 Units by mouth daily.         Marland Kitchen docusate sodium (COLACE) 100 MG capsule   Oral   Take 100 mg by mouth daily. Patient is given this medication to help with constipation.         . moxifloxacin (VIGAMOX) 0.5 % ophthalmic solution   Left Eye   Place 1 drop into the left eye 4 (four) times daily.         . Multiple Vitamins-Minerals (MULTIVITAMIN WITH MINERALS) tablet   Oral   Take 1 tablet by mouth daily.         Marland Kitchen PHENobarbital (LUMINAL) 32.4 MG tablet   Oral   Take 32.4 mg by mouth daily at 12 noon.         Marland Kitchen PHENobarbital (LUMINAL) 64.8 MG tablet   Oral   Take 64.8 mg by mouth every evening.         . prednisoLONE acetate (PRED FORTE) 1 % ophthalmic suspension   Left  Eye   Place 1 drop into the left eye 4 (four) times daily.         Marland Kitchen PRESCRIPTION MEDICATION   Left Eye   Place 1 drop into the left eye at bedtime.         . simvastatin (ZOCOR) 40 MG tablet   Oral   Take 40 mg by mouth every evening.         . traZODone (DESYREL) 50 MG tablet   Oral   Take 50-100 mg by mouth at bedtime as needed for sleep.          BP 169/101  Pulse 96  Temp(Src) 98.7 F (37.1 C) (Oral)  Resp 20  SpO2 96% Physical Exam  Nursing note and vitals reviewed. Constitutional: He appears well-developed and well-nourished.  HENT:  Head: Normocephalic and atraumatic.  Right Ear: External ear normal.  Left Ear: External ear normal.  Nose: Nose normal.  Eyes: Right eye exhibits no discharge. Left eye exhibits no discharge.  Neck: Neck supple.  Cardiovascular: Regular rhythm, normal heart sounds and intact distal pulses.  Tachycardia present.   Pulmonary/Chest: Effort normal and breath sounds normal.  Abdominal: Soft. He exhibits distension  (mild, lower abdominal). Bowel sounds are decreased. There is tenderness (mild).  Well healed and old laparatomy scars  Musculoskeletal: He exhibits no edema and no tenderness.  Neurological: He is alert.  Skin: Skin is warm and dry.    ED Course  Procedures (including critical care time) Labs Review Labs Reviewed  URINALYSIS, ROUTINE W REFLEX MICROSCOPIC - Abnormal; Notable for the following:    Protein, ur 30 (*)    All other components within normal limits  COMPREHENSIVE METABOLIC PANEL - Abnormal; Notable for the following:    Glucose, Bld 104 (*)    Creatinine, Ser 1.85 (*)    GFR calc non Af Amer 39 (*)    GFR calc Af Amer 46 (*)    All other components within normal limits  CBC WITH DIFFERENTIAL - Abnormal; Notable for the following:    WBC 14.1 (*)    Neutrophils Relative % 82 (*)    Neutro Abs 11.5 (*)    Lymphocytes Relative 11 (*)    All other components within normal limits  MAGNESIUM  URINE MICROSCOPIC-ADD ON  CBC WITH DIFFERENTIAL  CG4 I-STAT (LACTIC ACID)   Imaging Review Dg Abd Acute W/chest  03/16/2013   CLINICAL DATA:  Constipation.  History of bowel obstruction.  EXAM: ACUTE ABDOMEN SERIES (ABDOMEN 2 VIEW & CHEST 1 VIEW)  COMPARISON:  Abdominal radiographs 02/26/2013 and chest x-ray a 09/25/2011.  FINDINGS: The upright chest x-ray demonstrates a left-sided aortic arch. The heart is upper limits of normal in size. No acute pulmonary findings.  Two views of the abdomen demonstrate dilated air and stool-filled colon suggesting a colonic ileus. No findings for small bowel obstruction or free air.  IMPRESSION: 1. Left-sided aortic arch.  No acute pulmonary findings. 2. Air and stool distended colon suggesting colonic ileus. No findings for small bowel obstruction or free air.   Electronically Signed   By: Loralie Champagne M.D.   On: 03/16/2013 12:27    EKG Interpretation   None       MDM   1. Ileus    Patient with what appears to be a colonic ileus based on  outpatient x-ray taken today. The patient has not had any vomiting. No obvious cause in his medicines. Likely could be from his recent anesthesia. Treat with  low-dose toradol given his elevated creatinine, as well as by mouth pain medicines. We'll hold off on narcotics as that would likely exacerbate the situation. Discussed with surgery who evaluated the patient and will admit to medicine for hydration and bowel prep for ileus management.    Audree Camel, MD 03/16/13 [phone]

## 2013-03-16 NOTE — Progress Notes (Signed)
CC: Henok Heacock is a 57 y.o. male is here for Headache and Constipation   Subjective: HPI:  Brought in by sister-in-law, patient is nonverbal entire history provided by sister-in-law  Sister-in-law reports that for the past 2 days patient has not been acting his normal self specifically seems to be holding his forehead has had decreased interest in food and fluids, and has not defecated since Thursday of last week. He typically has a bowel movement on a daily basis.  Over the past 2 nights he has had restless sleep which is also atypical for him. Patient is unable to localize discomfort or communicate why his behavior has been changed.  Family denies any suspicion of fever, chills, night sweats, cough, shortness of breath, painful urination, rash, nor new motor disturbances.  Past medical history significant for successful cataract operation which took place on Thursday under sedation. They had followup with her ophthalmologist yesterday and were told that everything looked perfect from an ophthalmology point of view.  He was also hospitalized around Thanksgiving for a small bowel obstruction.  Since discharge she has had loose bowel movements up until Thursday when he has not passed anything since.   Review Of Systems Outlined In HPI  Past Medical History  Diagnosis Date  . Autism spectrum disorder   . Hypercholesteremia   . Thyroid condition   . Bowel obstruction   . Broken leg Left  . Osteoporosis   . Mental retardation   . Hyperlipidemia   . Malnutrition      Family History  Problem Relation Age of Onset  . Hypertension Other   . Diabetes Other   . Hyperlipidemia Other   . Scoliosis Other      History  Substance Use Topics  . Smoking status: Never Smoker   . Smokeless tobacco: Not on file  . Alcohol Use: No     Objective: Filed Vitals:   03/16/13 1133  BP: 161/112  Pulse: 116    General: Appears uncomfortable HEENT: Pupils equal, round, reactive to light.  Conjunctivae clear.  External ears unremarkable, canals clear with intact TMs with appropriate landmarks.  Middle ear appears open without effusion. Pink inferior turbinates.  Moist mucous membranes, pharynx without inflammation nor lesions.  Neck supple without palpable lymphadenopathy nor abnormal masses. Lungs: Clear to auscultation bilaterally, no wheezing/ronchi/rales.  Comfortable work of breathing. Good air movement. Cardiac: Mild tachycardia with regular rhythm. Normal S1/S2.  No murmurs, rubs, nor gallops.   Abdomen: Hyperactive bowel sounds mild distention, no rebound no palpable masses nor guarding Extremities: No peripheral edema.  Strong peripheral pulses.  Skin: Warm and dry.  Assessment & Plan: Bynum was seen today for headache and constipation.  Diagnoses and associated orders for this visit:  Abdominal bloating - DG Abd Acute W/Chest; Future    Abdominal x-ray was obtained personal interpretation makes me concerned of a return of his small bowel obstruction due to distended small bowel loops with air fluid levels.  He and his caretaker were urged to present to a local emergency room for further management, he will likely need admission for decompression pending a CT scan therefore they prefer to go back to [facility] where he was admitted weeks ago.  Return if symptoms worsen or fail to improve.

## 2013-03-16 NOTE — H&P (Signed)
[facility] History and Physical  Richard [name] ZOX:[id number] DOB: [birth year] DOA: 03/16/2013  Referring physician:  PCP: Richard Gasser, MD   Chief Complaint: Abdominal pain  HPI: Richard [name] is a 57 y.o. male with a past medical history of MR, autism, as well as a history of 2 abdominal surgeries performed in [place], was recently discharged on 03/01/2013 from the surgery service at which time he was treated for a small bowel obstruction. Patient was started on IV fluids, antibiotic therapy, bowel rest with small bowel obstruction resolving. On 03/14/2013 he underwent cataract removal surgery. Caregiver present at bedside reporting that he has not had a bowel movement since last Friday. Since Monday care giver has noticed increased abdominal distention associated with abdominal pain. Patient has not had nausea or vomiting he was able to tolerate his breakfast this morning. Patient has been passing flatus. He has not been on narcotic analgesics recently however did undergo anesthesia for recent cataract surgery. History was obtained from caregiver present at bedside as patient is unable to provide history due to history of MR and autism. On abdominal film performed in the emergency department showed air and stool in descending colon  suggesting colonic ileus. Patient was seen and evaluated by Richard [name] of general surgery and was not found to have  an acute surgical issue. Symptoms felt to be secondary to constipation versus ileus.                                                                                                       Review of Systems:  On unobtainable, patient with history of MR and autism, cannot participate in his own plan of care or provide reliable history.  Past Medical History  Diagnosis Date  . Autism spectrum disorder   . Hypercholesteremia   . Thyroid condition   . Bowel obstruction   . Broken leg Left  . Osteoporosis   . Mental retardation   .  Hyperlipidemia   . Malnutrition    Past Surgical History  Procedure Laterality Date  . Abdominal surgery    . Leg surgery    . Eye surgery Left 03-14-2013    catatract   Social History:  reports that he has never smoked. He does not have any smokeless tobacco history on file. He reports that he does not drink alcohol. His drug history is not on file.  Allergies  Allergen Reactions  . Ofloxacin     Unsure   . Phenothiazines Other (See Comments)    Unknown    Family History  Problem Relation Age of Onset  . Hypertension Other   . Diabetes Other   . Hyperlipidemia Other   . Scoliosis Other      Prior to Admission medications   Medication Sig Start Date End Date Taking? Authorizing Provider  acetaminophen (TYLENOL) 325 MG tablet Take 2 tablets (650 mg total) by mouth every 4 (four) hours as needed for mild pain, fever or headache. 03/01/13  Yes Richard George, PA-C  calcium-vitamin D (OSCAL WITH D) 500-200 MG-UNIT per tablet Take 1 tablet by mouth  2 (two) times daily.   Yes Historical Provider, MD  cholecalciferol (VITAMIN D) 1000 UNITS tablet Take 2,000 Units by mouth daily.   Yes Historical Provider, MD  docusate sodium (COLACE) 100 MG capsule Take 100 mg by mouth daily. Patient is given this medication to help with constipation.   Yes Historical Provider, MD  moxifloxacin (VIGAMOX) 0.5 % ophthalmic solution Place 1 drop into the left eye 4 (four) times daily.   Yes Historical Provider, MD  Multiple Vitamins-Minerals (MULTIVITAMIN WITH MINERALS) tablet Take 1 tablet by mouth daily.   Yes Historical Provider, MD  PHENobarbital (LUMINAL) 32.4 MG tablet Take 32.4 mg by mouth daily at 12 noon.   Yes Historical Provider, MD  PHENobarbital (LUMINAL) 64.8 MG tablet Take 64.8 mg by mouth every evening.   Yes Historical Provider, MD  prednisoLONE acetate (PRED FORTE) 1 % ophthalmic suspension Place 1 drop into the left eye 4 (four) times daily.   Yes Historical Provider, MD   PRESCRIPTION MEDICATION Place 1 drop into the left eye at bedtime.   Yes Historical Provider, MD  simvastatin (ZOCOR) 40 MG tablet Take 40 mg by mouth every evening.   Yes Historical Provider, MD  traZODone (DESYREL) 50 MG tablet Take 50-100 mg by mouth at bedtime as needed for sleep.   Yes Historical Provider, MD   Physical Exam: Filed Vitals:   03/16/13 1707  BP: 175/113  Pulse: 95  Temp:   Resp: 18    BP 175/113  Pulse 95  Temp(Src) 98.7 F (37.1 C) (Oral)  Resp 18  SpO2 96%  General:  Appears to be in discomfort  Eyes: Right eye PERRL, normal lids, irises & conjunctiva, left thigh with patch, status post cataract removal ENT: grossly normal hearing, lips & tongue Neck: no LAD, masses or thyromegaly Cardiovascular: RRR, no m/r/g. No LE edema. Telemetry: SR, no arrhythmias  Respiratory: CTA bilaterally, no w/r/r. Normal respiratory effort. Abdomen: Mildly distended, decreased bowel sounds,  Generalized pain to palpation. Skin: no rash or induration seen on limited exam Musculoskeletal: grossly normal tone BUE/BLE Psychiatric: grossly normal mood and affect, speech fluent and appropriate Neurologic: grossly non-focal.          Labs on Admission:  Basic Metabolic Panel:  Recent Labs Lab 03/16/13 1450  NA 140  K 4.4  CL 104  CO2 23  GLUCOSE 104*  BUN 16  CREATININE 1.85*  CALCIUM 9.9  MG 1.8   Liver Function Tests:  Recent Labs Lab 03/16/13 1450  AST 23  ALT 14  ALKPHOS 106  BILITOT 0.3  PROT 7.1  ALBUMIN 4.2   No results found for this basename: LIPASE, AMYLASE,  in the last 168 hours No results found for this basename: AMMONIA,  in the last 168 hours CBC:  Recent Labs Lab 03/16/13 1450  WBC 14.1*  NEUTROABS 11.5*  HGB 15.6  HCT 44.2  MCV 93.2  PLT 256   Cardiac Enzymes: No results found for this basename: CKTOTAL, CKMB, CKMBINDEX, TROPONINI,  in the last 168 hours  BNP (last 3 results) No results found for this basename: PROBNP,  in  the last 8760 hours CBG: No results found for this basename: GLUCAP,  in the last 168 hours  Radiological Exams on Admission: Dg Abd Acute W/chest  03/16/2013   CLINICAL DATA:  Constipation.  History of bowel obstruction.  EXAM: ACUTE ABDOMEN SERIES (ABDOMEN 2 VIEW & CHEST 1 VIEW)  COMPARISON:  Abdominal radiographs 02/26/2013 and chest x-ray a 09/25/2011.  FINDINGS: The  upright chest x-ray demonstrates a left-sided aortic arch. The heart is upper limits of normal in size. No acute pulmonary findings.  Two views of the abdomen demonstrate dilated air and stool-filled colon suggesting a colonic ileus. No findings for small bowel obstruction or free air.  IMPRESSION: 1. Left-sided aortic arch.  No acute pulmonary findings. 2. Air and stool distended colon suggesting colonic ileus. No findings for small bowel obstruction or free air.   Electronically Signed   By: Loralie Champagne M.D.   On: 03/16/2013 12:27    EKG: Independently reviewed. EKG showing sinus tachycardia, no ischemic change  Assessment/Plan Active Problems:   Ileus   History of seizures   Autism disorder   MR (mental retardation)   1. Abdominal pain. Likely secondary to ileus versus constipation. Caregiver reporting not having a bowel movement since last Friday, with the development of abdominal distention and a abdominal pain after cataract removal on Monday. Initial labs showing a white count of 14,100 as well as a creatinine of 1.85. Patient was seen and evaluated by general surgery in the emergency department who will consult. Will provide supportive care, IV fluid resuscitation, bowel rest, enema, suppositories, bowel regimen as recommended by general surgery.  2. Acute on chronic renal failure. Patient residing with a creatinine of 1.85 with a BUN of 16. Looking back at records, it appears he has a history of stage III chronic kidney disease, with a baseline creatinine between 1.5 and 1.6. Suspect secondary to prerenal azotemia,  provide IV fluid resuscitation. 3. History of small bowel obstruction. Patient was recently discharged from the surgery service on 03/01/2013 which time he was treated for small bowel obstruction. He was seen and evaluated by general surgery in the emergency department, labs and x-ray reviewed, it was not felt that current presentation represented small bowel obstruction. 4. History of seizure disorder. Will continue his home regimen with phenobarbital. 5. Dyslipidemia. Patient on statin therapy 6. Nutrition. N.p.o. 7. DVT prophylaxis. Subcutaneous heparin   Code  Full Code Family Communication: Plan discussed with caregiver Disposition Plan: I anticipate he will require greater than 2 nights hospitalization  Time spent: 70 minutes  Jeralyn Bennett [facility] Pager [phone]

## 2013-03-16 NOTE — Progress Notes (Signed)
Notified sister in law about formal radiology read and that I would still encourage ED assessment to determine if surgical consult is warranted.

## 2013-03-16 NOTE — ED Notes (Signed)
Bowel obstruction 3 weeks ago, nonsurgical resolution, had diarrhea for 3 days after d/c, had large bowel movement Friday with nothing since, nonverbal, but family states pt indicates that he is pain, had xray done in [place], concerned about obstruction, was instructed to bring pt to [place]

## 2013-03-16 NOTE — Progress Notes (Signed)
Utilization Review completed.  Dream Harman RN CM  

## 2013-03-16 NOTE — Consult Note (Signed)
Reason for Consult: abdominal pain Referring Physician: Pricilla Loveless     HPI: Richard [name] is a 57 year old male with an autism spectrum disorder who is non verbal who presents today alongside his caregiver with abdominal pain.  The caregiver reports that he has been doing fairly well until Monday.  He had cataract surgery on Monday, shortly thereafter he developed increased pain and constipation.  Caregiver reports he is passing flatus, last bowel movement was on Friday.  She recently stopped his colace because he was actually having loose stools.  Denies nausea or vomiting.  He was able to eat cereal this morning.  Modifying factors include; milk of magnesia and apple cider without any results. His symptoms are moderate in severity.  Associated with anorexia. Time patter is constant.  He has not been taking narcotics.  His electrolytes are stable.  sCr increased from baseline.  XR of abdomen showed a colonic ileus without evidence of an obstruction.  He was admitted under our service for a SBO dated 11/26-12/2 and we have therefore been asked in consultation.   Past Medical History  Diagnosis Date  . Autism spectrum disorder   . Hypercholesteremia   . Thyroid condition   . Bowel obstruction   . Broken leg Left  . Osteoporosis   . Mental retardation   . Hyperlipidemia   . Malnutrition     Past Surgical History  Procedure Laterality Date  . Abdominal surgery    . Leg surgery    . Eye surgery Left 03-14-2013    catatract    Family History  Problem Relation Age of Onset  . Hypertension Other   . Diabetes Other   . Hyperlipidemia Other   . Scoliosis Other     Social History:  reports that he has never smoked. He does not have any smokeless tobacco history on file. He reports that he does not drink alcohol. His drug history is not on file.  Allergies:  Allergies  Allergen Reactions  . Ofloxacin     Unsure   . Phenothiazines Other (See Comments)    Unknown     Medications: I have reviewed the patient's current medications.  Results for orders placed during the hospital encounter of 03/16/13 (from the past 48 hour(s))  COMPREHENSIVE METABOLIC PANEL     Status: Abnormal (Preliminary result)   Collection Time    03/16/13  2:50 PM      Result Value Range   Sodium 140  135 - 145 mEq/L   Potassium PENDING  3.5 - 5.1 mEq/L   Chloride 104  96 - 112 mEq/L   CO2 23  19 - 32 mEq/L   Glucose, Bld 104 (*) 70 - 99 mg/dL   BUN 16  6 - 23 mg/dL   Creatinine, Ser 4.54 (*) 0.50 - 1.35 mg/dL   Calcium 9.9  8.4 - 09.8 mg/dL   Total Protein 7.1  6.0 - 8.3 g/dL   Albumin 4.2  3.5 - 5.2 g/dL   AST PENDING  0 - 37 U/L   ALT PENDING  0 - 53 U/L   Alkaline Phosphatase 106  39 - 117 U/L   Total Bilirubin 0.3  0.3 - 1.2 mg/dL   GFR calc non Af Amer 39 (*) >90 mL/min   GFR calc Af Amer 46 (*) >90 mL/min   Comment: (NOTE)     The eGFR has been calculated using the CKD EPI equation.     This calculation has not been validated  in all clinical situations.     eGFR's persistently <90 mL/min signify possible Chronic Kidney     Disease.  CBC WITH DIFFERENTIAL     Status: Abnormal   Collection Time    03/16/13  2:50 PM      Result Value Range   WBC 14.1 (*) 4.0 - 10.5 K/uL   RBC 4.74  4.22 - 5.81 MIL/uL   Hemoglobin 15.6  13.0 - 17.0 g/dL   HCT 16.1  09.6 - 04.5 %   MCV 93.2  78.0 - 100.0 fL   MCH 32.9  26.0 - 34.0 pg   MCHC 35.3  30.0 - 36.0 g/dL   RDW 40.9  81.1 - 91.4 %   Platelets 256  150 - 400 K/uL   Neutrophils Relative % 82 (*) 43 - 77 %   Neutro Abs 11.5 (*) 1.7 - 7.7 K/uL   Lymphocytes Relative 11 (*) 12 - 46 %   Lymphs Abs 1.6  0.7 - 4.0 K/uL   Monocytes Relative 7  3 - 12 %   Monocytes Absolute 1.0  0.1 - 1.0 K/uL   Eosinophils Relative 0  0 - 5 %   Eosinophils Absolute 0.0  0.0 - 0.7 K/uL   Basophils Relative 0  0 - 1 %   Basophils Absolute 0.0  0.0 - 0.1 K/uL  MAGNESIUM     Status: None   Collection Time    03/16/13  2:50 PM       Result Value Range   Magnesium 1.8  1.5 - 2.5 mg/dL  CG4 I-STAT (LACTIC ACID)     Status: None   Collection Time    03/16/13  3:13 PM      Result Value Range   Lactic Acid, Venous 2.00  0.5 - 2.2 mmol/L    Dg Abd Acute W/chest  03/16/2013   CLINICAL DATA:  Constipation.  History of bowel obstruction.  EXAM: ACUTE ABDOMEN SERIES (ABDOMEN 2 VIEW & CHEST 1 VIEW)  COMPARISON:  Abdominal radiographs 02/26/2013 and chest x-ray a 09/25/2011.  FINDINGS: The upright chest x-ray demonstrates a left-sided aortic arch. The heart is upper limits of normal in size. No acute pulmonary findings.  Two views of the abdomen demonstrate dilated air and stool-filled colon suggesting a colonic ileus. No findings for small bowel obstruction or free air.  IMPRESSION: 1. Left-sided aortic arch.  No acute pulmonary findings. 2. Air and stool distended colon suggesting colonic ileus. No findings for small bowel obstruction or free air.   Electronically Signed   By: Loralie Champagne M.D.   On: 03/16/2013 12:27    Review of Systems  Constitutional: Negative for fever and chills.  Cardiovascular: Negative for chest pain and palpitations.  Gastrointestinal: Positive for abdominal pain and constipation. Negative for nausea and vomiting.  Genitourinary: Negative for dysuria.  Neurological: Positive for headaches. Negative for dizziness and loss of consciousness.   Blood pressure 169/101, pulse 96, temperature 98.7 F (37.1 C), temperature source Oral, resp. rate 20, SpO2 96.00%. Physical Exam  Constitutional: He appears well-developed and well-nourished. No distress.  HENT:  Left eye patch  Cardiovascular: Normal rate, regular rhythm and normal heart sounds.   Respiratory: Effort normal and breath sounds normal.  GI: Soft. Bowel sounds are normal. He exhibits no mass. There is no rebound.  TTP RLQ and LLQ, no guarding or evidence of peritonitis.  He is distended Midline scar  Musculoskeletal: He exhibits no edema.   Neurological: He is alert.  Skin:  He is not diaphoretic.    Assessment/Plan: Abdominal pain Colonic ileus He does not appear to have an obstruction.  Would recommend bowel rest, enemas, suppositories and a good bowel regimen along with IV hydration.  May place NGT if he starts to develop nausea or vomiting.  Thank you for the consult.  Surgery will continue to follow.   RIEBOCK, EMINA ANP-BC 03/16/2013, 4:30 PM   Agree with above. Sister in law, Rhian Funari, with patient.  He is non-communicative, which makes diagnosing his problems difficult.   His cataract surgery was at [facility] [place] on Monday, 03/14/2013. He sees Dr. Orvan July as his PCP, but went to [facility] today because of abdominal pain. He has a tendency toward constipation.  He has also had a headache since his cataract surgery.  This is according to Ms. Jimmey Ralph. He tends toward constipation with a bowel movement every 2 or 3 days. He's had two prior abdominal operations (both in [place]) - possibly one for volvulus. No acute surgical issue now.  Constipation vs ileus. There is no obvious acute surgical issue.  Ovidio Kin, MD, [facility] Surgery Pager: [phone] Office phone:  [phone]

## 2013-03-16 NOTE — ED Notes (Addendum)
Pt has abd pain since Monday night, had bowel obstruction 3 weeks ago. Pt is non-verbal Pt has been slowing down eat, hasn't had BM since Friday, and has been holding hands up to head. Pt is with sister-n-law. Sister-n-law states that had an x-ray done this am and may be a bowel obstruction again.

## 2013-03-17 DIAGNOSIS — K5900 Constipation, unspecified: Secondary | ICD-10-CM

## 2013-03-17 DIAGNOSIS — E785 Hyperlipidemia, unspecified: Secondary | ICD-10-CM

## 2013-03-17 DIAGNOSIS — E860 Dehydration: Secondary | ICD-10-CM

## 2013-03-17 LAB — BASIC METABOLIC PANEL
BUN: 19 mg/dL (ref 6–23)
Calcium: 9 mg/dL (ref 8.4–10.5)
Chloride: 104 mEq/L (ref 96–112)
Creatinine, Ser: 2.08 mg/dL — ABNORMAL HIGH (ref 0.50–1.35)
GFR calc Af Amer: 40 mL/min — ABNORMAL LOW (ref >90)
GFR calc non Af Amer: 34 mL/min — ABNORMAL LOW (ref >90)
Sodium: 139 mEq/L (ref 135–145)

## 2013-03-17 LAB — CBC
MCHC: 34 g/dL (ref 30.0–36.0)
Platelets: 254 10*3/uL (ref 150–400)
RDW: 12.6 % (ref 11.5–15.5)
WBC: 12.9 10*3/uL — ABNORMAL HIGH (ref 4.0–10.5)

## 2013-03-17 MED ORDER — LABETALOL HCL 5 MG/ML IV SOLN
10.0000 mg | INTRAVENOUS | Status: DC | PRN
  Filled 2013-03-17: qty 4

## 2013-03-17 MED ORDER — SODIUM CHLORIDE 0.9 % IV SOLN
INTRAVENOUS | Status: DC
  Administered 2013-03-17: 21:00:00 via INTRAVENOUS

## 2013-03-17 MED ORDER — BISACODYL 10 MG RE SUPP
10.0000 mg | Freq: Once | RECTAL | Status: AC
  Administered 2013-03-17: 10 mg via RECTAL
  Filled 2013-03-17: qty 1

## 2013-03-17 MED ORDER — POLYETHYLENE GLYCOL 3350 17 G PO PACK
17.0000 g | PACK | Freq: Two times a day (BID) | ORAL | Status: DC
  Administered 2013-03-17 – 2013-03-18 (×2): 17 g via ORAL
  Filled 2013-03-17 (×4): qty 1

## 2013-03-17 MED ORDER — MORPHINE SULFATE 2 MG/ML IJ SOLN
2.0000 mg | INTRAMUSCULAR | Status: DC | PRN
  Administered 2013-03-17: 2 mg via INTRAVENOUS
  Filled 2013-03-17: qty 1

## 2013-03-17 NOTE — Progress Notes (Signed)
[facility] PROGRESS NOTE  Richard [name] ZOX:[id number] DOB: 1956/02/18 DOA: 03/16/2013 PCP: Richard Gasser, MD  Assessment/Plan: 1. Colonic ileus. Patient presented yesterday to the emergency department with abdominal pain and distention, not having a bowel movement since last Friday. He was seen and evaluated by general surgery. Had a small bowel movement after receiving an enema. Surgery recommending increasing MiraLax to twice a day and giving Dulcolax suppository. His diet was advanced to clears today. 2. Acute on chronic renal failure. Patient have an upward trend in his creatinine going from 1.85 on admission to 2.08 so this mornings lab work. Will continue IV fluid resuscitation, followup on a.m. Labs. 3. History of small bowel obstruction. Patient recently discharged from the surgical service on 03/01/2013 at which time history for small bowel obstruction. It is felt that current presentation likely secondary to colonic ileus. General surgery is following. 4. History of seizure disorder. Continue barbital 5. Nutrition. Diet advanced to clears today.  Code Status: Full code Family Communication: Plan discussed with family members present at bedside Disposition Plan: Continue providing supportive care, suppositories, IV fluid resuscitation, followup on a.m. x-ray.   Consultants:  General surgery   HPI/Subjective: Patient is a pleasant 57 year old gentleman with a past medical history of MR admitted on 03/16/2013 presented with abdominal distention and pain, has a history of undergoing cataract removal on 03/14/2013. Family members stating patient not having a bowel movement since last Friday. He has been passing flatus. He was seen and evaluated by Richard [name] of general surgery.found to have an acute surgical issue.  Objective: Filed Vitals:   03/17/13 1659  BP: 143/82  Pulse: 71  Temp: 97.8 F (36.6 C)  Resp: 16   No intake or output data in the 24 hours ending  03/17/13 Richard [name] Filed Weights   03/16/13 1921  Weight: 80.332 kg (177 lb 1.6 oz)    Exam:   General:  Patient seems improved during my counter, he is more interactive. Family members at bedside stating this is near his baseline.  Cardiovascular: Regular rate and rhythm normal S1-S2  Respiratory: Lungs are clear to auscultation  Abdomen: Patient appears to have some pain to palpation over generalized abdomen  Musculoskeletal: No edema   Data Reviewed: Basic Metabolic Panel:  Recent Labs Lab 03/16/13 1450 03/17/13 0515  NA 140 139  K 4.4 3.9  CL 104 104  CO2 23 24  GLUCOSE 104* 92  BUN 16 19  CREATININE 1.85* 2.08*  CALCIUM 9.9 9.0  MG 1.8  --    Liver Function Tests:  Recent Labs Lab 03/16/13 1450  AST 23  ALT 14  ALKPHOS 106  BILITOT 0.3  PROT 7.1  ALBUMIN 4.2   No results found for this basename: LIPASE, AMYLASE,  in the last 168 hours No results found for this basename: AMMONIA,  in the last 168 hours CBC:  Recent Labs Lab 03/16/13 1450 03/17/13 0515  WBC 14.1* 12.9*  NEUTROABS 11.5*  --   HGB 15.6 14.3  HCT 44.2 42.0  MCV 93.2 95.2  PLT 256 254   Cardiac Enzymes: No results found for this basename: CKTOTAL, CKMB, CKMBINDEX, TROPONINI,  in the last 168 hours BNP (last 3 results) No results found for this basename: PROBNP,  in the last 8760 hours CBG: No results found for this basename: GLUCAP,  in the last 168 hours  No results found for this or any previous visit (from the past 240 hour(s)).   Studies: Dg Abd Acute W/chest  03/16/2013  CLINICAL DATA:  Constipation.  History of bowel obstruction.  EXAM: ACUTE ABDOMEN SERIES (ABDOMEN 2 VIEW & CHEST 1 VIEW)  COMPARISON:  Abdominal radiographs 02/26/2013 and chest x-ray a 09/25/2011.  FINDINGS: The upright chest x-ray demonstrates a left-sided aortic arch. The heart is upper limits of normal in size. No acute pulmonary findings.  Two views of the abdomen demonstrate dilated air and stool-filled  colon suggesting a colonic ileus. No findings for small bowel obstruction or free air.  IMPRESSION: 1. Left-sided aortic arch.  No acute pulmonary findings. 2. Air and stool distended colon suggesting colonic ileus. No findings for small bowel obstruction or free air.   Electronically Signed   By: Richard [name] M.D.   On: 03/16/2013 12:27    Scheduled Meds: . heparin  5,000 Units Subcutaneous Q8H  . moxifloxacin  1 drop Left Eye QID  . PHENobarbital  32.4 mg Oral Q1200  . PHENobarbital  64.8 mg Oral QPM  . polyethylene glycol  17 g Oral BID  . prednisoLONE acetate  1 drop Left Eye Q3H while awake   Followed by  . [START ON 03/22/2013] prednisoLONE acetate  1 drop Left Eye QID  . senna-docusate  1 tablet Oral BID  . simvastatin  40 mg Oral QPM   Continuous Infusions: . sodium chloride 100 mL/hr at 03/17/13 1415    Active Problems:   Ileus   History of seizures   Autism disorder   MR (mental retardation)    Time spent: 35 minutes    Richard [name]  [facility] Pager [phone]. If 7PM-7AM, please contact night-coverage at [email or web address], password [email or web address] 03/17/2013, 6:38 PM  LOS: 1 day

## 2013-03-17 NOTE — Progress Notes (Signed)
Patient ID: Richard [name], male   DOB: 1955/12/14, 57 y.o.   MRN: [id number]  Subjective: Per caregiver, small amount of liquid stool after enema was given.   Objective:  Vital signs:  Filed Vitals:   03/16/13 1336 03/16/13 1707 03/16/13 1921 03/17/13 0630  BP: 169/101 175/113 162/106 153/91  Pulse: 96 95 91 97  Temp:   99.9 F (37.7 C) 98.9 F (37.2 C)  TempSrc:   Axillary Axillary  Resp:  18 20 18   Height:   6' (1.829 m)   Weight:   177 lb 1.6 oz (80.332 kg)   SpO2:  96% 96% 95%    Last BM Date: 03/16/13  Physical Exam:  General: Pt awake/alert/oriented and in no acute distress Abdomen: Soft.  Nondistended.  Mild generalized TTP.  No evidence of peritonitis.  No incarcerated hernias.  Rectal exam done, no fecal impaction.   Ext:  SCDs BLE.  No mjr edema.  No cyanosis Skin: No petechiae / purpura   Problem List:   Active Problems:   History of seizures   Autism disorder   MR (mental retardation)   Ileus    Results:   Labs: Results for orders placed during the hospital encounter of 03/16/13 (from the past 48 hour(s))  COMPREHENSIVE METABOLIC PANEL     Status: Abnormal   Collection Time    03/16/13  2:50 PM      Result Value Range   Sodium 140  135 - 145 mEq/L   Potassium 4.4  3.5 - 5.1 mEq/L   Comment: HEMOLYSIS AT THIS LEVEL MAY AFFECT RESULT     SLIGHT HEMOLYSIS   Chloride 104  96 - 112 mEq/L   CO2 23  19 - 32 mEq/L   Glucose, Bld 104 (*) 70 - 99 mg/dL   BUN 16  6 - 23 mg/dL   Creatinine, Ser 4.09 (*) 0.50 - 1.35 mg/dL   Calcium 9.9  8.4 - 81.1 mg/dL   Total Protein 7.1  6.0 - 8.3 g/dL   Albumin 4.2  3.5 - 5.2 g/dL   AST 23  0 - 37 U/L   Comment: HEMOLYSIS AT THIS LEVEL MAY AFFECT RESULT     SLIGHT HEMOLYSIS   ALT 14  0 - 53 U/L   Comment: HEMOLYSIS AT THIS LEVEL MAY AFFECT RESULT     SLIGHT HEMOLYSIS   Alkaline Phosphatase 106  39 - 117 U/L   Total Bilirubin 0.3  0.3 - 1.2 mg/dL   GFR calc non Af Amer 39 (*) >90 mL/min   GFR calc Af Amer 46  (*) >90 mL/min   Comment: (NOTE)     The eGFR has been calculated using the CKD EPI equation.     This calculation has not been validated in all clinical situations.     eGFR's persistently <90 mL/min signify possible Chronic Kidney     Disease.  CBC WITH DIFFERENTIAL     Status: Abnormal   Collection Time    03/16/13  2:50 PM      Result Value Range   WBC 14.1 (*) 4.0 - 10.5 K/uL   RBC 4.74  4.22 - 5.81 MIL/uL   Hemoglobin 15.6  13.0 - 17.0 g/dL   HCT 91.4  78.2 - 95.6 %   MCV 93.2  78.0 - 100.0 fL   MCH 32.9  26.0 - 34.0 pg   MCHC 35.3  30.0 - 36.0 g/dL   RDW 21.3  08.6 - 57.8 %   Platelets 256  150 - 400 K/uL   Neutrophils Relative % 82 (*) 43 - 77 %   Neutro Abs 11.5 (*) 1.7 - 7.7 K/uL   Lymphocytes Relative 11 (*) 12 - 46 %   Lymphs Abs 1.6  0.7 - 4.0 K/uL   Monocytes Relative 7  3 - 12 %   Monocytes Absolute 1.0  0.1 - 1.0 K/uL   Eosinophils Relative 0  0 - 5 %   Eosinophils Absolute 0.0  0.0 - 0.7 K/uL   Basophils Relative 0  0 - 1 %   Basophils Absolute 0.0  0.0 - 0.1 K/uL  MAGNESIUM     Status: None   Collection Time    03/16/13  2:50 PM      Result Value Range   Magnesium 1.8  1.5 - 2.5 mg/dL  CG4 I-STAT (LACTIC ACID)     Status: None   Collection Time    03/16/13  3:13 PM      Result Value Range   Lactic Acid, Venous 2.00  0.5 - 2.2 mmol/L  URINALYSIS, ROUTINE W REFLEX MICROSCOPIC     Status: Abnormal   Collection Time    03/16/13  4:54 PM      Result Value Range   Color, Urine YELLOW  YELLOW   APPearance CLEAR  CLEAR   Specific Gravity, Urine 1.010  1.005 - 1.030   pH 5.5  5.0 - 8.0   Glucose, UA NEGATIVE  NEGATIVE mg/dL   Hgb urine dipstick NEGATIVE  NEGATIVE   Bilirubin Urine NEGATIVE  NEGATIVE   Ketones, ur NEGATIVE  NEGATIVE mg/dL   Protein, ur 30 (*) NEGATIVE mg/dL   Urobilinogen, UA 0.2  0.0 - 1.0 mg/dL   Nitrite NEGATIVE  NEGATIVE   Leukocytes, UA NEGATIVE  NEGATIVE  URINE MICROSCOPIC-ADD ON     Status: None   Collection Time    03/16/13   4:54 PM      Result Value Range   Squamous Epithelial / LPF RARE  RARE  BASIC METABOLIC PANEL     Status: Abnormal   Collection Time    03/17/13  5:15 AM      Result Value Range   Sodium 139  135 - 145 mEq/L   Potassium 3.9  3.5 - 5.1 mEq/L   Chloride 104  96 - 112 mEq/L   CO2 24  19 - 32 mEq/L   Glucose, Bld 92  70 - 99 mg/dL   BUN 19  6 - 23 mg/dL   Creatinine, Ser 1.91 (*) 0.50 - 1.35 mg/dL   Calcium 9.0  8.4 - 47.8 mg/dL   GFR calc non Af Amer 34 (*) >90 mL/min   GFR calc Af Amer 40 (*) >90 mL/min   Comment: (NOTE)     The eGFR has been calculated using the CKD EPI equation.     This calculation has not been validated in all clinical situations.     eGFR's persistently <90 mL/min signify possible Chronic Kidney     Disease.  CBC     Status: Abnormal   Collection Time    03/17/13  5:15 AM      Result Value Range   WBC 12.9 (*) 4.0 - 10.5 K/uL   RBC 4.41  4.22 - 5.81 MIL/uL   Hemoglobin 14.3  13.0 - 17.0 g/dL   HCT 29.5  62.1 - 30.8 %   MCV 95.2  78.0 - 100.0 fL   MCH 32.4  26.0 - 34.0 pg  MCHC 34.0  30.0 - 36.0 g/dL   RDW 81.1  91.4 - 78.2 %   Platelets 254  150 - 400 K/uL    Imaging / Studies: Dg Abd Acute W/chest  03/16/2013   CLINICAL DATA:  Constipation.  History of bowel obstruction.  EXAM: ACUTE ABDOMEN SERIES (ABDOMEN 2 VIEW & CHEST 1 VIEW)  COMPARISON:  Abdominal radiographs 02/26/2013 and chest x-ray a 09/25/2011.  FINDINGS: The upright chest x-ray demonstrates a left-sided aortic arch. The heart is upper limits of normal in size. No acute pulmonary findings.  Two views of the abdomen demonstrate dilated air and stool-filled colon suggesting a colonic ileus. No findings for small bowel obstruction or free air.  IMPRESSION: 1. Left-sided aortic arch.  No acute pulmonary findings. 2. Air and stool distended colon suggesting colonic ileus. No findings for small bowel obstruction or free air.   Electronically Signed   By: Loralie Champagne M.D.   On: 03/16/2013 12:27     Medications / Allergies: per chart  Antibiotics: Anti-infectives   None      Assessment/Plan Constipation Colonic ileus -increase miralax to BID -give dulcolax suppository -may have sips of clears if able to tolerate -continue IV hydration -encourage mobility  -avoid narcotic pain medication  Ashok Norris, [facility] Surgery Pager [phone] Office [phone]  03/17/2013 8:39 AM  Agree with above. Sister in law, Richard [name], in room.  She works in the [employer], her husband works for [employer]. He's taken some liquids and had small BM.  Ovidio Kin, MD, [facility] Surgery Pager: [phone] Office phone:  [phone]

## 2013-03-18 ENCOUNTER — Inpatient Hospital Stay (HOSPITAL_COMMUNITY): Payer: Medicare Other

## 2013-03-18 MED ORDER — POLYETHYLENE GLYCOL 3350 17 G PO PACK: 17.0000 g | PACK | Freq: Two times a day (BID) | ORAL | Status: DC

## 2013-03-18 MED ORDER — LACTULOSE 10 GM/15ML PO SOLN
20.0000 g | Freq: Two times a day (BID) | ORAL | Status: DC | PRN
  Filled 2013-03-18: qty 30

## 2013-03-18 MED ORDER — DOCUSATE SODIUM 100 MG PO CAPS: 100.0000 mg | ORAL_CAPSULE | Freq: Two times a day (BID) | ORAL | Status: DC

## 2013-03-18 MED ORDER — BISACODYL 10 MG RE SUPP: 10.0000 mg | Freq: Every day | RECTAL | Status: DC | PRN

## 2013-03-18 MED ORDER — LACTULOSE 10 GM/15ML PO SOLN: 20.0000 g | Freq: Two times a day (BID) | ORAL | Status: DC | PRN

## 2013-03-18 NOTE — Discharge Summary (Signed)
Physician Discharge Summary  Richard [name]:[id number] DOB: 1955/12/13 DOA: 03/16/2013  PCP: Richard Gasser, MD  Admit date: 03/16/2013 Discharge date: 03/18/2013  Time spent: 35 minutes  Recommendations for Outpatient Follow-up:  1. Please follow up on patient's constipation  Discharge Diagnoses:  Active Problems:   Ileus   History of seizures   Autism disorder   MR (mental retardation)   Discharge Condition: Stable/Improved  Diet recommendation: Heart Healthy  Filed Weights   03/16/13 1921  Weight: 80.332 kg (177 lb 1.6 oz)    History of present illness:  Richard [name] is a 57 y.o. male with a past medical history of MR, autism, as well as a history of 2 abdominal surgeries performed in [place], was recently discharged on 03/01/2013 from the surgery service at which time he was treated for a small bowel obstruction. Patient was started on IV fluids, antibiotic therapy, bowel rest with small bowel obstruction resolving. On 03/14/2013 he underwent cataract removal surgery. Caregiver present at bedside reporting that he has not had a bowel movement since last Friday. Since Monday care giver has noticed increased abdominal distention associated with abdominal pain. Patient has not had nausea or vomiting he was able to tolerate his breakfast this morning. Patient has been passing flatus. He has not been on narcotic analgesics recently however did undergo anesthesia for recent cataract surgery. History was obtained from caregiver present at bedside as patient is unable to provide history due to history of MR and autism. On abdominal film performed in the emergency department showed air and stool in descending colon suggesting colonic ileus. Patient was seen and evaluated by Dr. Ezzard Standing of general surgery and was not found to have an acute surgical issue. Symptoms felt to be secondary to constipation versus ileus.    Hospital Course:  Patient is a pleasant 58 year old gentleman  with a past medical history of autism, MR, history of 2 abdominal surgeries that were performed in [place], recently discharged from the surgery service on 03/01/2013 at which time he was treated for a small bowel obstruction. He presented to the emergency department on 03/16/2013 with abdominal pain. There were no reports of nausea vomiting bright red blood per rectum or melena. Initial lab work showing a hemoglobin of 15.6. Abdominal film showed air and stool in distended colon suggesting colonic ileus. Dr. Ezzard Standing of general surgery was consulted. Symptoms felt to be secondary to constipation versus ileus. Surgery recommending IV fluid resuscitation, enema, suppository, good bowel regimen. By the following day patient showed some improvement as he had a bowel movement. Abdominal film repeated on 03/18/2013 showed decreased distention in gas-filled loops of bowel throughout abdomen. Surgery had no further recommendations and signed off. At this point patient had been tolerating clears with advancement to full liquids. He appear to be functioning at his baseline, confirmed by family members present at bedside. He was discharged in stable condition on 03/18/2013.   Consultations:  General surgery: Dr. Ezzard Standing  Discharge Exam: Filed Vitals:   03/18/13 1329  BP: 148/84  Pulse: 78  Temp: 98.1 F (36.7 C)  Resp: 18    General: Patient is in no acute distress he is awake alert following commands. Cardiovascular: Regular rate and rhythm normal S1-S2 no murmurs rubs or gallops Respiratory: Lungs are clear to auscultation bilaterally Abdomen: Soft nontender nondistended, having benign abdominal examination  Discharge Instructions  Discharge Orders   Future Orders Complete By Expires   Call MD for:  difficulty breathing, headache or visual disturbances  As directed  Call MD for:  extreme fatigue  As directed    Call MD for:  persistant nausea and vomiting  As directed    Call MD for:  severe  uncontrolled pain  As directed    Diet - low sodium heart healthy  As directed    Increase activity slowly  As directed        Medication List         acetaminophen 325 MG tablet  Commonly known as:  TYLENOL  Take 2 tablets (650 mg total) by mouth every 4 (four) hours as needed for mild pain, fever or headache.     bisacodyl 10 MG suppository  Commonly known as:  DULCOLAX  Place 1 suppository (10 mg total) rectally daily as needed for moderate constipation.     calcium-vitamin D 500-200 MG-UNIT per tablet  Commonly known as:  OSCAL WITH D  Take 1 tablet by mouth 2 (two) times daily.     cholecalciferol 1000 UNITS tablet  Commonly known as:  VITAMIN D  Take 2,000 Units by mouth daily.     docusate sodium 100 MG capsule  Commonly known as:  COLACE  Take 1 capsule (100 mg total) by mouth 2 (two) times daily. Patient is given this medication to help with constipation.     lactulose 10 GM/15ML solution  Commonly known as:  CHRONULAC  Take 30 mLs (20 g total) by mouth 2 (two) times daily as needed for mild constipation or moderate constipation.     moxifloxacin 0.5 % ophthalmic solution  Commonly known as:  VIGAMOX  Place 1 drop into the left eye 4 (four) times daily. 7 days started 03/15/13     multivitamin with minerals tablet  Take 1 tablet by mouth daily.     neomycin-polymyxin-dexameth 0.1 % Oint  Commonly known as:  MAXITROL  Place 1 application into the left eye at bedtime. 1/4 INCH     PHENobarbital 32.4 MG tablet  Commonly known as:  LUMINAL  Take 32.4 mg by mouth daily at 12 noon.     PHENobarbital 64.8 MG tablet  Commonly known as:  LUMINAL  Take 64.8 mg by mouth every evening.     polyethylene glycol packet  Commonly known as:  MIRALAX / GLYCOLAX  Take 17 g by mouth 2 (two) times daily.     prednisoLONE acetate 1 % ophthalmic suspension  Commonly known as:  PRED FORTE  Place 1 drop into the left eye every 3 (three) hours while awake. Started 03/15/13  after 7 days then drop to four times daily until follow up     simvastatin 40 MG tablet  Commonly known as:  ZOCOR  Take 40 mg by mouth every evening.     traZODone 50 MG tablet  Commonly known as:  DESYREL  Take 50-100 mg by mouth at bedtime as needed for sleep.       Allergies  Allergen Reactions  . Ofloxacin     Unsure   . Phenothiazines Other (See Comments)    Unknown       Follow-up Information   Follow up with METHENEY,CATHERINE, MD In 1 week.   Specialty:  Family Medicine   Contact information:   [address] [place] [address] [phone]        The results of significant diagnostics from this hospitalization (including imaging, microbiology, ancillary and laboratory) are listed below for reference.    Significant Diagnostic Studies: Ct Abdomen Pelvis Wo Contrast  02/24/2013   CLINICAL DATA:  Abdominal pain. Mild constipation. Previous abdominal surgery for twisted intestines 20 years ago.  EXAM: CT ABDOMEN AND PELVIS WITHOUT CONTRAST  TECHNIQUE: Multidetector CT imaging of the abdomen and pelvis was performed following the standard protocol without intravenous contrast.  COMPARISON:  None.  FINDINGS: There appearing mild interstitial changes in the lung bases which could represent edema or fibrosis. There is a small esophageal hiatal hernia with contrast material in the lower esophagus suggesting reflux or dysmotility.  There is marked distention of fluid in gas-filled small bowel with decompression of the terminal ileum consistent with high-grade small bowel obstruction. A specific transition zone is not appreciated but appears to be in the right lower quadrant. There is no bowel wall thickening or pneumatosis. No free intra-abdominal air.  The unenhanced appearance of the liver, spleen, gallbladder, pancreas, adrenal glands, abdominal aorta, inferior vena cava, and retroperitoneal lymph nodes is unremarkable. There is a pelvic location of the  right kidney. The kidneys appear atrophic.  Pelvis: The prostate gland is enlarged, measuring 4.7 x 5.3 cm. No free or loculated pelvic fluid collections. No significant pelvic lymphadenopathy. The appendix is not identified. The colon is decompressed. Normal alignment of the lumbar spine. No destructive bone lesions appreciated.  IMPRESSION: March distention of gas in fluid-filled small bowel with transition zone apparently in the right lower quadrant. Changes are consistent with high-grade small bowel obstruction.   Electronically Signed   By: Burman Nieves M.D.   On: 02/24/2013 01:46   Dg Abd 1 View  03/18/2013   CLINICAL DATA:  Evaluate ileus.  EXAM: ABDOMEN - 1 VIEW  COMPARISON:  03/16/2013  FINDINGS: Supine view of the abdomen was obtained. There are gas-filled loops of bowel throughout the abdomen. Majority of the bowel gas appears to represent colon. Overall, the degree of bowel gas distention has decreased. Limited evaluation for free air on this supine view.  IMPRESSION: There continues to be gas-filled loops of bowel throughout the abdomen but the degree of bowel gas distention has decreased.   Electronically Signed   By: Richarda Overlie M.D.   On: 03/18/2013 08:05   Dg Abd 1 View  02/24/2013   CLINICAL DATA:  Abdominal distention. Patient will feet. Abdominal pain.  EXAM: ABDOMEN - 1 VIEW  COMPARISON:  09/25/2011  FINDINGS: Diffuse gaseous distention of small bowel with multiple air-fluid levels. Limited gas in the colon. Supine views would be useful for better delineation of the colon. Changes are worrisome for small bowel obstruction. No free intra-abdominal air. Probable gastric distention is well. No radiopaque stones. Visualized bones appear intact.  IMPRESSION: Gaseous distention of the small bowel with multiple air-fluid levels suggesting small bowel obstruction.   Electronically Signed   By: Burman Nieves M.D.   On: 02/24/2013 00:07   Dg Abd 2 Views  02/26/2013   CLINICAL DATA:   Evaluate abdominal distention  EXAM: ABDOMEN - 2 VIEW  COMPARISON:  02/23/2013; CT abdomen pelvis -02/24/2013  FINDINGS: There has been interval transit of previously ingested enteric contrast, now seen within in the descending and sigmoid colon to the level of the rectum.  There is persistent mild gaseous distention of several loops of small bowel with apparent air-fluid level within the mid abdomen. No pneumoperitoneum, pneumatosis or portal venous gas.  Limited visualization of the lower thorax demonstrates interval development of bibasilar heterogeneous opacities, right greater than left. Trace right-sided effusion is suspected.  Scoliotic curvature of the thoracolumbar spine.  IMPRESSION: 1. Overall  findings suggestive of improving though persistent partial small bowel obstruction. 2. Interval development of bibasilar heterogeneous opacities, right greater than left, possibly atelectasis a worrisome for multifocal infection. Further evaluation with dedicated chest radiographs may be performed as clinically indicated.   Electronically Signed   By: Simonne Come M.D.   On: 02/26/2013 08:11   Dg Abd Acute W/chest  03/16/2013   CLINICAL DATA:  Constipation.  History of bowel obstruction.  EXAM: ACUTE ABDOMEN SERIES (ABDOMEN 2 VIEW & CHEST 1 VIEW)  COMPARISON:  Abdominal radiographs 02/26/2013 and chest x-ray a 09/25/2011.  FINDINGS: The upright chest x-ray demonstrates a left-sided aortic arch. The heart is upper limits of normal in size. No acute pulmonary findings.  Two views of the abdomen demonstrate dilated air and stool-filled colon suggesting a colonic ileus. No findings for small bowel obstruction or free air.  IMPRESSION: 1. Left-sided aortic arch.  No acute pulmonary findings. 2. Air and stool distended colon suggesting colonic ileus. No findings for small bowel obstruction or free air.   Electronically Signed   By: Loralie Champagne M.D.   On: 03/16/2013 12:27   Dg Basil Dess Tube Plc W/fl  W/rad  02/24/2013   CLINICAL DATA:  Nasogastric tube placement.  Bowel obstruction.  EXAM: NASO G TUBE PLACEMENT WITH FL AND WITH RAD  COMPARISON:  None.  FLUOROSCOPY TIME:  10 seconds  FINDINGS: Despite sedation and presence of patient's brother, patient would not allow nasogastric tube tube to be placed. One attempt with 53 [place] and 3 attempts with 33 [place] were not successful.  IMPRESSION: Unsuccessful attempt at placement of nasogastric tube.   Electronically Signed   By: Bridgett Larsson M.D.   On: 02/24/2013 11:11    Microbiology: No results found for this or any previous visit (from the past 240 hour(s)).   Labs: Basic Metabolic Panel:  Recent Labs Lab 03/16/13 1450 03/17/13 0515  NA 140 139  K 4.4 3.9  CL 104 104  CO2 23 24  GLUCOSE 104* 92  BUN 16 19  CREATININE 1.85* 2.08*  CALCIUM 9.9 9.0  MG 1.8  --    Liver Function Tests:  Recent Labs Lab 03/16/13 1450  AST 23  ALT 14  ALKPHOS 106  BILITOT 0.3  PROT 7.1  ALBUMIN 4.2   No results found for this basename: LIPASE, AMYLASE,  in the last 168 hours No results found for this basename: AMMONIA,  in the last 168 hours CBC:  Recent Labs Lab 03/16/13 1450 03/17/13 0515  WBC 14.1* 12.9*  NEUTROABS 11.5*  --   HGB 15.6 14.3  HCT 44.2 42.0  MCV 93.2 95.2  PLT 256 254   Cardiac Enzymes: No results found for this basename: CKTOTAL, CKMB, CKMBINDEX, TROPONINI,  in the last 168 hours BNP: BNP (last 3 results) No results found for this basename: PROBNP,  in the last 8760 hours CBG: No results found for this basename: GLUCAP,  in the last 168 hours     Signed:  Jeralyn Bennett  [facility] 03/18/2013, 2:46 PM

## 2013-03-18 NOTE — Progress Notes (Signed)
Patient ID: Richard [name], male   DOB: 1955-10-21, 57 y.o.   MRN: [id number]    Subjective: Pt nonverbal.  Sister in law states he is tolerating his clear liquids  Objective: Vital signs in last 24 hours: Temp:  [97.8 F (36.6 C)-100.4 F (38 C)] 99.6 F (37.6 C) (12/18 2200) Pulse Rate:  [71-95] 74 (12/18 2200) Resp:  [16-18] 18 (12/18 2200) BP: (143-164)/(82-92) 147/91 mmHg (12/18 2200) SpO2:  [96 %-98 %] 98 % (12/18 2200) Last BM Date: 03/17/13  Intake/Output from previous day: 12/18 0701 - 12/19 0700 In: 438.3 [I.V.:438.3] Out: -  Intake/Output this shift:    PE: Abd: soft, doesn't seem tender, +BS, minimal bloating  Lab Results:   Recent Labs  03/16/13 1450 03/17/13 0515  WBC 14.1* 12.9*  HGB 15.6 14.3  HCT 44.2 42.0  PLT 256 254   BMET  Recent Labs  03/16/13 1450 03/17/13 0515  NA 140 139  K 4.4 3.9  CL 104 104  CO2 23 24  GLUCOSE 104* 92  BUN 16 19  CREATININE 1.85* 2.08*  CALCIUM 9.9 9.0   PT/INR No results found for this basename: LABPROT, INR,  in the last 72 hours CMP     Component Value Date/Time   NA 139 03/17/2013 0515   K 3.9 03/17/2013 0515   CL 104 03/17/2013 0515   CO2 24 03/17/2013 0515   GLUCOSE 92 03/17/2013 0515   BUN 19 03/17/2013 0515   CREATININE 2.08* 03/17/2013 0515   CALCIUM 9.0 03/17/2013 0515   PROT 7.1 03/16/2013 1450   ALBUMIN 4.2 03/16/2013 1450   AST 23 03/16/2013 1450   ALT 14 03/16/2013 1450   ALKPHOS 106 03/16/2013 1450   BILITOT 0.3 03/16/2013 1450   GFRNONAA 34* 03/17/2013 0515   GFRAA 40* 03/17/2013 0515   Lipase     Component Value Date/Time   LIPASE 44 02/24/2013 0028       Studies/Results: Dg Abd 1 View  03/18/2013   CLINICAL DATA:  Evaluate ileus.  EXAM: ABDOMEN - 1 VIEW  COMPARISON:  03/16/2013  FINDINGS: Supine view of the abdomen was obtained. There are gas-filled loops of bowel throughout the abdomen. Majority of the bowel gas appears to represent colon. Overall, the degree of bowel  gas distention has decreased. Limited evaluation for free air on this supine view.  IMPRESSION: There continues to be gas-filled loops of bowel throughout the abdomen but the degree of bowel gas distention has decreased.   Electronically Signed   By: Richarda Overlie M.D.   On: 03/18/2013 08:05   Dg Abd Acute W/chest  03/16/2013   CLINICAL DATA:  Constipation.  History of bowel obstruction.  EXAM: ACUTE ABDOMEN SERIES (ABDOMEN 2 VIEW & CHEST 1 VIEW)  COMPARISON:  Abdominal radiographs 02/26/2013 and chest x-ray a 09/25/2011.  FINDINGS: The upright chest x-ray demonstrates a left-sided aortic arch. The heart is upper limits of normal in size. No acute pulmonary findings.  Two views of the abdomen demonstrate dilated air and stool-filled colon suggesting a colonic ileus. No findings for small bowel obstruction or free air.  IMPRESSION: 1. Left-sided aortic arch.  No acute pulmonary findings. 2. Air and stool distended colon suggesting colonic ileus. No findings for small bowel obstruction or free air.   Electronically Signed   By: Loralie Champagne M.D.   On: 03/16/2013 12:27    Anti-infectives: Anti-infectives   None       Assessment/Plan  1. Colonic ileus, improving 2. Nonverbal autism  Plan: 1. Patient seems to be improving.  His films look better today.  He is tolerating clear liquids.  Will advance to full liquids.  His diet can likely then be advanced as tolerates.  No surgical indications. We will sign off.  LOS: 2 days    OSBORNE,KELLY E 03/18/2013, 9:10 AM Pager: [phone]  Agree with above.  Ovidio Kin, MD, [facility] Surgery Pager: [phone] Office phone:  [phone]

## 2013-03-18 NOTE — Progress Notes (Signed)
Pt discharged home via family; Pt and family given and explained all discharge instructions, carenotes, and prescriptions; pt and family stated understanding and denied questions/concerns; all f/u appointments in place; IV removed without complicaitons; pt stable at time of discharge   Instructions given directly to family at bedside; both stated understanding and denied questions/concerns

## 2013-03-22 DIAGNOSIS — Z961 Presence of intraocular lens: Secondary | ICD-10-CM | POA: Insufficient documentation

## 2013-04-04 ENCOUNTER — Encounter: Payer: Self-pay | Admitting: Family Medicine

## 2013-04-04 ENCOUNTER — Ambulatory Visit (INDEPENDENT_AMBULATORY_CARE_PROVIDER_SITE_OTHER): Payer: Medicare Other | Admitting: Family Medicine

## 2013-04-04 VITALS — BP 124/80 | HR 75 | Temp 97.2°F | Wt 171.0 lb

## 2013-04-04 DIAGNOSIS — K219 Gastro-esophageal reflux disease without esophagitis: Secondary | ICD-10-CM

## 2013-04-04 DIAGNOSIS — K5900 Constipation, unspecified: Secondary | ICD-10-CM

## 2013-04-04 MED ORDER — RANITIDINE HCL 150 MG PO TABS: ORAL_TABLET | ORAL | Status: DC

## 2013-04-04 NOTE — Progress Notes (Signed)
CC: Richard [name] is a 58 y.o. male is here for hospital f/u   Subjective: HPI:  Patient accompanied by brother  Brother reports that since discharge patient has had a bowel movement on a daily basis. Currently he describes these as "lava like", not completely solid but not particularly liquid either. They appear to be painless. There has been no difficulty with bowel incontinence.  Patient does not express any abdominal discomfort to anybody in the family. His appetite is back and he is trying to eat everything he sees insight. He is getting a dose of MiraLax twice a day, he is getting 2 doses of docusate 100 mg. Lactulose was used only twice since discharge but not all in the past 2 weeks.  The brother's only concern today regards coughing that occurs after large meals.  This lasts for about 1-3 hours. It is mild and infrequent but not present any other time during the day.  Patient denies any neck pain or difficulty swallowing. There's been no vomiting, regurgitation, nor decrease in fluid/oral intake.  Review Of Systems Outlined In HPI  Past Medical History  Diagnosis Date  . Autism spectrum disorder   . Hypercholesteremia   . Bowel obstruction   . Broken leg Left  . Osteoporosis   . Mental retardation   . Hyperlipidemia   . Malnutrition   . Thyroid disease      Family History  Problem Relation Age of Onset  . Hypertension Other   . Diabetes Other   . Hyperlipidemia Other   . Scoliosis Other      History  Substance Use Topics  . Smoking status: Never Smoker   . Smokeless tobacco: Not on file  . Alcohol Use: No     Objective: Filed Vitals:   04/04/13 1429  BP: 124/80  Pulse: 75  Temp: 97.2 F (36.2 C)    General: Alert, No Acute Distress HEENT: Pupils equal, round, reactive to light. Conjunctivae clear.  Moist membranes pharynx unremarkable Lungs: Clear to auscultation bilaterally, no wheezing/ronchi/rales.  Comfortable work of breathing. Good air  movement. Cardiac: Regular rate and rhythm. Normal S1/S2.  No murmurs, rubs, nor gallops.   Abdomen: Normal bowel sounds, soft and non tender without palpable masses. Extremities: No peripheral edema.  Strong peripheral pulses.  Mental Status: No depression, anxiety, nor agitation. Skin: Warm and dry.  Assessment & Plan: Richard [name] was seen today for hospital f/u.  Diagnoses and associated orders for this visit:  Unspecified constipation  GERD (gastroesophageal reflux disease)  Other Orders - ranitidine (ZANTAC) 150 MG tablet; One by mouth twice a day to prevent gastric reflux symptoms.    Constipation: Resolved, suspect that this is likely due to anesthesia when he was getting his cataract removed.  I've encouraged him to cut back on MiraLax to only one dose a day may continue docusate. GERD: Suspect this could be causing his cough, try ranitidine over-the-counter twice a day. Return if not improving in one week  25 minutes spent [name]-to-[name] during visit today of which at least 50% was counseling or coordinating care regarding: 1. Unspecified constipation   2. GERD (gastroesophageal reflux disease)      Return if symptoms worsen or fail to improve.

## 2013-05-23 ENCOUNTER — Telehealth: Payer: Self-pay | Admitting: *Deleted

## 2013-05-23 NOTE — Telephone Encounter (Signed)
Recent OV and labs to be faxed to [phone].Loralee PacasBarkley, Aleshka Corney LutakLynetta

## 2013-10-15 DIAGNOSIS — R809 Proteinuria, unspecified: Secondary | ICD-10-CM | POA: Insufficient documentation

## 2013-10-15 DIAGNOSIS — I10 Essential (primary) hypertension: Secondary | ICD-10-CM | POA: Insufficient documentation

## 2013-11-06 ENCOUNTER — Emergency Department (HOSPITAL_COMMUNITY): Payer: Medicare Other

## 2013-11-06 ENCOUNTER — Inpatient Hospital Stay (HOSPITAL_COMMUNITY)
Admission: EM | Admit: 2013-11-06 | Discharge: 2013-11-10 | DRG: 389 | Disposition: A | Discharge status: Home / Self Care | Payer: Medicare Other | Attending: Internal Medicine | Admitting: Internal Medicine

## 2013-11-06 ENCOUNTER — Encounter (HOSPITAL_COMMUNITY): Payer: Self-pay | Admitting: Emergency Medicine

## 2013-11-06 DIAGNOSIS — E785 Hyperlipidemia, unspecified: Secondary | ICD-10-CM | POA: Diagnosis present

## 2013-11-06 DIAGNOSIS — Z87898 Personal history of other specified conditions: Secondary | ICD-10-CM

## 2013-11-06 DIAGNOSIS — G4700 Insomnia, unspecified: Secondary | ICD-10-CM

## 2013-11-06 DIAGNOSIS — I129 Hypertensive chronic kidney disease with stage 1 through stage 4 chronic kidney disease, or unspecified chronic kidney disease: Secondary | ICD-10-CM | POA: Diagnosis present

## 2013-11-06 DIAGNOSIS — R030 Elevated blood-pressure reading, without diagnosis of hypertension: Secondary | ICD-10-CM | POA: Diagnosis present

## 2013-11-06 DIAGNOSIS — F840 Autistic disorder: Secondary | ICD-10-CM | POA: Diagnosis present

## 2013-11-06 DIAGNOSIS — G40909 Epilepsy, unspecified, not intractable, without status epilepticus: Secondary | ICD-10-CM | POA: Diagnosis present

## 2013-11-06 DIAGNOSIS — N183 Chronic kidney disease, stage 3 unspecified: Secondary | ICD-10-CM | POA: Diagnosis present

## 2013-11-06 DIAGNOSIS — R630 Anorexia: Secondary | ICD-10-CM | POA: Diagnosis not present

## 2013-11-06 DIAGNOSIS — Z8669 Personal history of other diseases of the nervous system and sense organs: Secondary | ICD-10-CM

## 2013-11-06 DIAGNOSIS — K567 Ileus, unspecified: Secondary | ICD-10-CM

## 2013-11-06 DIAGNOSIS — R194 Change in bowel habit: Secondary | ICD-10-CM

## 2013-11-06 DIAGNOSIS — R339 Retention of urine, unspecified: Secondary | ICD-10-CM | POA: Diagnosis present

## 2013-11-06 DIAGNOSIS — M4120 Other idiopathic scoliosis, site unspecified: Secondary | ICD-10-CM

## 2013-11-06 DIAGNOSIS — F79 Unspecified intellectual disabilities: Secondary | ICD-10-CM | POA: Diagnosis present

## 2013-11-06 DIAGNOSIS — R1084 Generalized abdominal pain: Secondary | ICD-10-CM

## 2013-11-06 DIAGNOSIS — K5900 Constipation, unspecified: Secondary | ICD-10-CM | POA: Diagnosis present

## 2013-11-06 DIAGNOSIS — M810 Age-related osteoporosis without current pathological fracture: Secondary | ICD-10-CM

## 2013-11-06 DIAGNOSIS — K56609 Unspecified intestinal obstruction, unspecified as to partial versus complete obstruction: Principal | ICD-10-CM | POA: Diagnosis present

## 2013-11-06 DIAGNOSIS — E7800 Pure hypercholesterolemia, unspecified: Secondary | ICD-10-CM | POA: Diagnosis present

## 2013-11-06 DIAGNOSIS — N1832 Chronic kidney disease, stage 3b: Secondary | ICD-10-CM | POA: Diagnosis present

## 2013-11-06 DIAGNOSIS — Z8719 Personal history of other diseases of the digestive system: Secondary | ICD-10-CM | POA: Diagnosis present

## 2013-11-06 HISTORY — DX: Chronic kidney disease, stage 3 (moderate): N18.3

## 2013-11-06 LAB — BASIC METABOLIC PANEL
ANION GAP: 13 (ref 5–15)
BUN: 12 mg/dL (ref 6–23)
CALCIUM: 9.7 mg/dL (ref 8.4–10.5)
CO2: 27 mEq/L (ref 19–32)
CREATININE: 1.75 mg/dL — AB (ref 0.50–1.35)
Chloride: 97 mEq/L (ref 96–112)
GFR calc non Af Amer: 42 mL/min — ABNORMAL LOW (ref >90)
GFR, EST AFRICAN AMERICAN: 48 mL/min — AB (ref >90)
Glucose, Bld: 97 mg/dL (ref 70–99)
Potassium: 4.8 mEq/L (ref 3.7–5.3)
Sodium: 137 mEq/L (ref 137–147)

## 2013-11-06 LAB — CBC WITH DIFFERENTIAL/PLATELET
BASOS ABS: 0 10*3/uL (ref 0.0–0.1)
BASOS PCT: 0 % (ref 0–1)
EOS ABS: 0.1 10*3/uL (ref 0.0–0.7)
Eosinophils Relative: 1 % (ref 0–5)
HEMATOCRIT: 39.4 % (ref 39.0–52.0)
Hemoglobin: 13.8 g/dL (ref 13.0–17.0)
Lymphocytes Relative: 32 % (ref 12–46)
Lymphs Abs: 1.7 10*3/uL (ref 0.7–4.0)
MCH: 31.9 pg (ref 26.0–34.0)
MCHC: 35 g/dL (ref 30.0–36.0)
MCV: 91.2 fL (ref 78.0–100.0)
MONO ABS: 0.6 10*3/uL (ref 0.1–1.0)
Monocytes Relative: 11 % (ref 3–12)
Neutro Abs: 2.9 10*3/uL (ref 1.7–7.7)
Neutrophils Relative %: 56 % (ref 43–77)
Platelets: 248 10*3/uL (ref 150–400)
RBC: 4.32 MIL/uL (ref 4.22–5.81)
RDW: 12.2 % (ref 11.5–15.5)
WBC: 5.3 10*3/uL (ref 4.0–10.5)

## 2013-11-06 LAB — LIPASE, BLOOD: Lipase: 75 U/L — ABNORMAL HIGH (ref 11–59)

## 2013-11-06 MED ORDER — IOHEXOL 300 MG/ML  SOLN
50.0000 mL | Freq: Once | INTRAMUSCULAR | Status: AC | PRN
  Administered 2013-11-06: 50 mL via ORAL

## 2013-11-06 NOTE — ED Notes (Signed)
Pt arrived to the ED from home with a complaint of abdominal pain.  Pt has mental retardation and is non verbal.  Pt has a history of small bowel obstruction.  Pt has been tearing up which is a sign that he is in pain.  [place].  Pt had his last solid stool on the 4th of this month. Stools have progressively gotten softer.  Pt gaurdian, his brother, states he has been giving his milk of magnesia twice since the 4th.

## 2013-11-06 NOTE — ED Provider Notes (Signed)
CSN: [id number]     Arrival date & time 11/06/13  2047 History   First MD Initiated Contact with Patient 11/06/13 2134     Chief Complaint  Patient presents with  . Abdominal Pain     (Consider location/radiation/quality/duration/timing/severity/associated sxs/prior Treatment) HPI  Richard [name] is a 58 y.o. male who is brought in by his brother, who gives history. The patient has had decreased appetite for 2 days with what appears to be abdominal pain. He has had an alteration in his stool, gradually, over the last 5 days, with a change in consistency, followed by diarrhea followed by no stooling for the last 36 hours. There's been no vomiting. He has been able to tolerate oral fluids. He has had several episodes of bowel obstruction, as well as 2 different surgeries on his abdomen. There's been no documented fever or chills. He has been coughing some over the last several days. The cough is nonproductive.  Level 5 caveat- mental retardation   Past Medical History  Diagnosis Date  . Autism spectrum disorder   . Hypercholesteremia   . Bowel obstruction   . Broken leg Left  . Osteoporosis   . Mental retardation   . Hyperlipidemia   . Malnutrition   . Thyroid disease   . CKD (chronic kidney disease) stage 3, GFR 30-59 ml/min 11/07/2013   Past Surgical History  Procedure Laterality Date  . Abdominal surgery    . Leg surgery    . Eye surgery Left 03-14-2013    catatract  . Appendectomy     Family History  Problem Relation Age of Onset  . Hypertension Other   . Diabetes Other   . Hyperlipidemia Other   . Scoliosis Other    History  Substance Use Topics  . Smoking status: Never Smoker   . Smokeless tobacco: Never Used  . Alcohol Use: No    Review of Systems  Unable to perform ROS     Allergies  Ofloxacin and Phenothiazines  Home Medications   Prior to Admission medications   Medication Sig Start Date End Date Taking? Authorizing Provider  calcium-vitamin D  (OSCAL WITH D) 500-200 MG-UNIT per tablet Take 1 tablet by mouth 2 (two) times daily.   Yes Historical Provider, MD  cholecalciferol (VITAMIN D) 1000 UNITS tablet Take 2,000 Units by mouth daily.   Yes Historical Provider, MD  docusate sodium (COLACE) 100 MG capsule Take 1 capsule (100 mg total) by mouth 2 (two) times daily. Patient is given this medication to help with constipation. 03/18/13  Yes Jeralyn Bennett, MD  magnesium hydroxide (MILK OF MAGNESIA) 400 MG/5ML suspension Take 45 mLs by mouth daily as needed for mild constipation.   Yes Historical Provider, MD  Multiple Vitamins-Minerals (MULTIVITAMIN WITH MINERALS) tablet Take 1 tablet by mouth daily.   Yes Historical Provider, MD  PHENobarbital (LUMINAL) 32.4 MG tablet Take 32.4 mg by mouth daily at 12 noon.   Yes Historical Provider, MD  PHENobarbital (LUMINAL) 64.8 MG tablet Take 64.8 mg by mouth every evening.   Yes Historical Provider, MD  polyethylene glycol (MIRALAX / GLYCOLAX) packet Take 17 g by mouth every other day.   Yes Historical Provider, MD  simvastatin (ZOCOR) 40 MG tablet Take 40 mg by mouth every evening.   Yes Historical Provider, MD   BP 154/93  Pulse 74  Temp(Src) 98.4 F (36.9 C) (Oral)  Resp 18  Ht 6\' 1"  (1.854 m)  Wt 182 lb 3.2 oz (82.645 kg)  BMI 24.04 kg/m2  SpO2 97% Physical Exam  Nursing note and vitals reviewed. Constitutional: He is oriented to person, place, and time. He appears well-developed and well-nourished.  HENT:  Head: Normocephalic and atraumatic.  Right Ear: External ear normal.  Left Ear: External ear normal.  Eyes: Conjunctivae and EOM are normal. Pupils are equal, round, and reactive to light.  Neck: Normal range of motion and phonation normal. Neck supple.  Cardiovascular: Normal rate, regular rhythm, normal heart sounds and intact distal pulses.   Pulmonary/Chest: Effort normal and breath sounds normal. No respiratory distress. He has no wheezes. He has no rales. He exhibits no  tenderness and no bony tenderness.  Abdominal: Soft. He exhibits distension (Mild). He exhibits no mass. There is tenderness (diffuse, moderate). There is guarding (all quadrants). There is no rebound.  Hyperactive bowel sounds. The abdomen is soft  Musculoskeletal: Normal range of motion.  Neurological: He is alert and oriented to person, place, and time. No cranial nerve deficit or sensory deficit. He exhibits normal muscle tone. Coordination normal.  Skin: Skin is warm, dry and intact.  Psychiatric: He has a normal mood and affect. His behavior is normal. Judgment and thought content normal.    ED Course  Procedures (including critical care time) Medications  enoxaparin (LOVENOX) injection 40 mg (40 mg Subcutaneous Given 11/07/13 1020)  0.9 %  sodium chloride infusion ( Intravenous Rate/Dose Change 11/07/13 1317)  acetaminophen (TYLENOL) tablet 650 mg (not administered)    Or  acetaminophen (TYLENOL) suppository 650 mg (not administered)  morphine 2 MG/ML injection 2 mg (not administered)  ondansetron (ZOFRAN) tablet 4 mg (not administered)    Or  ondansetron (ZOFRAN) injection 4 mg (not administered)  pantoprazole (PROTONIX) injection 40 mg (40 mg Intravenous Given 11/07/13 0737)  PHENObarbital (LUMINAL) injection 65 mg (65 mg Intravenous Given 11/07/13 2147)  PHENObarbital (LUMINAL) injection 32.5 mg (32.5 mg Intravenous Given 11/07/13 1216)  tamsulosin (FLOMAX) capsule 0.4 mg (0.4 mg Oral Given 11/07/13 1417)  iohexol (OMNIPAQUE) 300 MG/ML solution 50 mL (50 mLs Oral Contrast Given 11/06/13 2352)  iohexol (OMNIPAQUE) 300 MG/ML solution 80 mL (80 mLs Intravenous Contrast Given 11/07/13 0054)    Patient Vitals for the past 24 hrs:  BP Temp Temp src Pulse Resp SpO2 Height Weight  11/07/13 2145 154/93 mmHg 98.4 F (36.9 C) Oral 74 18 97 % - -  11/07/13 1340 151/91 mmHg 98.2 F (36.8 C) Axillary 78 17 98 % - -  11/07/13 0351 158/89 mmHg 98.6 F (37 C) Axillary 70 18 97 % 6\' 1"  (1.854 m)  182 lb 3.2 oz (82.645 kg)  11/07/13 0310 154/93 mmHg 98.6 F (37 C) Axillary 68 20 98 % - -  11/07/13 0014 157/93 mmHg 98.4 F (36.9 C) Axillary 60 16 99 % - -    10:11 PM Reevaluation with update and discussion. After initial assessment and treatment, an updated evaluation reveals PE unchanged. Discussed situation with brother. Pt cannot verbalize discomfort, because of Autism. He is difficult to assess and treat. We will attempt to proceed with CT imaging and blood testing. Note that pt. Has refused/fought off NG and IVF in the past. Kaylena Pacifico L   Labs Review Labs Reviewed  BASIC METABOLIC PANEL - Abnormal; Notable for the following:    Creatinine, Ser 1.75 (*)    GFR calc non Af Amer 42 (*)    GFR calc Af Amer 48 (*)    All other components within normal limits  LIPASE, BLOOD - Abnormal; Notable for the following:  Lipase 75 (*)    All other components within normal limits  COMPREHENSIVE METABOLIC PANEL - Abnormal; Notable for the following:    Creatinine, Ser 1.70 (*)    Total Bilirubin 0.2 (*)    GFR calc non Af Amer 43 (*)    GFR calc Af Amer 50 (*)    All other components within normal limits  URINE CULTURE  CBC WITH DIFFERENTIAL  MAGNESIUM  CBC  PHOSPHORUS  TSH  APTT  PROTIME-INR  URINALYSIS, ROUTINE W REFLEX MICROSCOPIC  PHENOBARBITAL LEVEL    Imaging Review Ct Abdomen Pelvis W Contrast  11/07/2013   CLINICAL DATA:  Pain, decreased swelling, decreased appetite. Elevated lipase.  EXAM: CT ABDOMEN AND PELVIS WITH CONTRAST  TECHNIQUE: Multidetector CT imaging of the abdomen and pelvis was performed using the standard protocol following bolus administration of intravenous contrast.  CONTRAST:  80mL OMNIPAQUE IOHEXOL 300 MG/ML  SOLN  COMPARISON:  Prior CT from 02/24/2013  FINDINGS: Mild interstitial changes noted at the visualized lung bases.  Subcapsular wedge-shaped hypodensity within the hepatic dome is stable from prior. A few additional scattered subcentimeter  hypodensities noted within the liver, too small the characterize. Gallbladder within normal limits. No biliary dilatation. The spleen and adrenal glands demonstrate a normal contrast enhanced appearance.  The pancreas is within normal limits. No significant peripancreatic inflammatory changes seen to suggest acute pancreatitis. No pancreatic ductal dilatation. Fairly homogeneous enhancement seen throughout the pancreatic parenchyma.  The right kidney is low-lying within the abdomen and externally rotated. Kidneys are otherwise unremarkable without evidence of nephrolithiasis, hydronephrosis, or focal enhancing renal mass.  Small hiatal hernia noted. Stomach otherwise unremarkable. Several mildly prominent contrast filled loops of small bowel are seen within the anterior and left abdomen, measuring up to 4.3 cm in diameter. The ileum is decompressed distally. There is question of a focal transition point within the right lower quadrant. Large amount of retained stool present throughout the colon. The colon itself is of normal caliber.  Bladder well distended but otherwise unremarkable. Enlarged prostate again noted.  No free air or fluid. No pathologically enlarged intra-abdominal pelvic lymph nodes. Right inguinal hernia containing intermediate fluid density is noted. Normal intravascular enhancement seen within the abdomen and pelvis.  No acute osseous abnormality. No worrisome lytic or blastic osseous lesions.  IMPRESSION: 1. Scattered mildly dilated loops of small bowel with associated air-fluid levels within the upper mid and left abdomen with decompressed ileum distally there is question of focal transition point in the right lower quadrant. Findings are suggestive of possible early or partial small bowel obstruction. Clinical correlation recommended. 2. Large amount of retained stool diffusely throughout the colon, suggesting constipation. 3. No other acute intra-abdominal or pelvic process.   Electronically  Signed   By: Rise Mu M.D.   On: 11/07/2013 01:45   Dg Abd Acute W/chest  11/06/2013   CLINICAL DATA:  Abdominal pain.  EXAM: ACUTE ABDOMEN SERIES (ABDOMEN 2 VIEW & CHEST 1 VIEW)  COMPARISON:  Abdominal radiograph from 03/18/2013  FINDINGS: The lungs are well-aerated. Minimal bibasilar airspace opacities likely reflect atelectasis. There is no evidence of focal pleural effusion or pneumothorax. The cardiomediastinal silhouette is enlarged.  Diffusely air-filled loops of small and large bowel are seen, with distention of the colon to the 8.5 cm. This likely reflects some degree of ileus. There is no definite evidence of distal obstruction. No free intra-abdominal air is identified on the provided upright view.  No acute osseous abnormalities are seen; the sacroiliac joints  are unremarkable in appearance. Minimal right convex thoracic scoliosis is noted.  IMPRESSION: 1. Diffuse distention of small and large bowel loops with air, likely reflecting some degree of ileus. No definite evidence of bowel obstruction. No free intra-abdominal air seen. 2. Minimal bibasilar airspace opacities likely reflect atelectasis; cardiomegaly noted.   Electronically Signed   By: Roanna RaiderJeffery  Chang M.D.   On: 11/06/2013 22:22   Dg Abd Portable 1v  11/07/2013   CLINICAL DATA:  Follow-up small bowel obstruction.  EXAM: PORTABLE ABDOMEN - 1 VIEW  COMPARISON:  CT 11/07/2013  FINDINGS: Supine images of the abdomen were obtained. Oral contrast has advanced into the colon. There continues to be gas filled loops of bowel, particularly in the left abdomen. Limited evaluation for free air on these supine images. The urinary bladder is markedly distended and contains contrast.  IMPRESSION: Oral contrast has advanced into the colon. No evidence for a high-grade obstruction. The bowel gas pattern is nonspecific.  Massive distension of the urinary bladder.   Electronically Signed   By: Richarda OverlieAdam  Henn M.D.   On: 11/07/2013 09:13     EKG  Interpretation None      MDM   Final diagnoses:  Generalized abdominal pain  Anorexia  Decreased stooling  SBO (small bowel obstruction)    Decreased appetite, decreased stooling, and abdominal discomfort; in a patient with history of small bowel obstruction. He has also had to have exploratory laparotomy in the past. Initial plain imaging was not diagnostic. CT imaging ordered to evaluate further.  Nursing Notes Reviewed/ Care Coordinated, and agree without changes. Applicable Imaging Reviewed.  Interpretation of Laboratory Data incorporated into ED treatment  Care to Dr. Wilkie AyeHorton to evaluate after CT returns.      Flint MelterElliott L Giana Castner, MD 11/07/13 [phone]

## 2013-11-07 ENCOUNTER — Encounter (HOSPITAL_COMMUNITY): Payer: Self-pay

## 2013-11-07 ENCOUNTER — Emergency Department (HOSPITAL_COMMUNITY): Payer: Medicare Other

## 2013-11-07 ENCOUNTER — Inpatient Hospital Stay (HOSPITAL_COMMUNITY): Payer: Medicare Other

## 2013-11-07 DIAGNOSIS — K5900 Constipation, unspecified: Secondary | ICD-10-CM | POA: Diagnosis present

## 2013-11-07 DIAGNOSIS — F79 Unspecified intellectual disabilities: Secondary | ICD-10-CM | POA: Diagnosis present

## 2013-11-07 DIAGNOSIS — E785 Hyperlipidemia, unspecified: Secondary | ICD-10-CM | POA: Diagnosis present

## 2013-11-07 DIAGNOSIS — N183 Chronic kidney disease, stage 3 unspecified: Secondary | ICD-10-CM

## 2013-11-07 DIAGNOSIS — Z8669 Personal history of other diseases of the nervous system and sense organs: Secondary | ICD-10-CM

## 2013-11-07 DIAGNOSIS — I129 Hypertensive chronic kidney disease with stage 1 through stage 4 chronic kidney disease, or unspecified chronic kidney disease: Secondary | ICD-10-CM | POA: Diagnosis present

## 2013-11-07 DIAGNOSIS — G40909 Epilepsy, unspecified, not intractable, without status epilepticus: Secondary | ICD-10-CM | POA: Diagnosis present

## 2013-11-07 DIAGNOSIS — F840 Autistic disorder: Secondary | ICD-10-CM | POA: Diagnosis present

## 2013-11-07 DIAGNOSIS — M810 Age-related osteoporosis without current pathological fracture: Secondary | ICD-10-CM | POA: Diagnosis present

## 2013-11-07 DIAGNOSIS — R1084 Generalized abdominal pain: Secondary | ICD-10-CM

## 2013-11-07 DIAGNOSIS — K56609 Unspecified intestinal obstruction, unspecified as to partial versus complete obstruction: Principal | ICD-10-CM

## 2013-11-07 DIAGNOSIS — R339 Retention of urine, unspecified: Secondary | ICD-10-CM | POA: Diagnosis present

## 2013-11-07 DIAGNOSIS — N1832 Chronic kidney disease, stage 3b: Secondary | ICD-10-CM | POA: Diagnosis present

## 2013-11-07 DIAGNOSIS — E7800 Pure hypercholesterolemia, unspecified: Secondary | ICD-10-CM | POA: Diagnosis present

## 2013-11-07 DIAGNOSIS — R630 Anorexia: Secondary | ICD-10-CM | POA: Diagnosis present

## 2013-11-07 DIAGNOSIS — R030 Elevated blood-pressure reading, without diagnosis of hypertension: Secondary | ICD-10-CM | POA: Diagnosis present

## 2013-11-07 HISTORY — DX: Chronic kidney disease, stage 3 unspecified: N18.30

## 2013-11-07 LAB — CBC
HCT: 39.8 % (ref 39.0–52.0)
Hemoglobin: 13.5 g/dL (ref 13.0–17.0)
MCH: 31.3 pg (ref 26.0–34.0)
MCHC: 33.9 g/dL (ref 30.0–36.0)
MCV: 92.1 fL (ref 78.0–100.0)
PLATELETS: 225 10*3/uL (ref 150–400)
RBC: 4.32 MIL/uL (ref 4.22–5.81)
RDW: 12.4 % (ref 11.5–15.5)
WBC: 4.9 10*3/uL (ref 4.0–10.5)

## 2013-11-07 LAB — APTT: aPTT: 28 seconds (ref 24–37)

## 2013-11-07 LAB — PROTIME-INR
INR: 1.06 (ref 0.00–1.49)
Prothrombin Time: 13.8 seconds (ref 11.6–15.2)

## 2013-11-07 LAB — COMPREHENSIVE METABOLIC PANEL
ALT: 12 U/L (ref 0–53)
AST: 20 U/L (ref 0–37)
Albumin: 4.1 g/dL (ref 3.5–5.2)
Alkaline Phosphatase: 103 U/L (ref 39–117)
Anion gap: 12 (ref 5–15)
BUN: 12 mg/dL (ref 6–23)
CALCIUM: 9.4 mg/dL (ref 8.4–10.5)
CO2: 26 mEq/L (ref 19–32)
Chloride: 99 mEq/L (ref 96–112)
Creatinine, Ser: 1.7 mg/dL — ABNORMAL HIGH (ref 0.50–1.35)
GFR calc non Af Amer: 43 mL/min — ABNORMAL LOW (ref >90)
GFR, EST AFRICAN AMERICAN: 50 mL/min — AB (ref >90)
Glucose, Bld: 97 mg/dL (ref 70–99)
Potassium: 4.7 mEq/L (ref 3.7–5.3)
SODIUM: 137 meq/L (ref 137–147)
TOTAL PROTEIN: 6.8 g/dL (ref 6.0–8.3)
Total Bilirubin: 0.2 mg/dL — ABNORMAL LOW (ref 0.3–1.2)

## 2013-11-07 LAB — URINALYSIS, ROUTINE W REFLEX MICROSCOPIC
Bilirubin Urine: NEGATIVE
Glucose, UA: NEGATIVE mg/dL
HGB URINE DIPSTICK: NEGATIVE
Ketones, ur: NEGATIVE mg/dL
LEUKOCYTES UA: NEGATIVE
Nitrite: NEGATIVE
Protein, ur: NEGATIVE mg/dL
SPECIFIC GRAVITY, URINE: 1.012 (ref 1.005–1.030)
UROBILINOGEN UA: 0.2 mg/dL (ref 0.0–1.0)
pH: 7 (ref 5.0–8.0)

## 2013-11-07 LAB — PHENOBARBITAL LEVEL: Phenobarbital: 18.7 ug/mL (ref 15.0–40.0)

## 2013-11-07 LAB — PHOSPHORUS: Phosphorus: 3 mg/dL (ref 2.3–4.6)

## 2013-11-07 LAB — MAGNESIUM: Magnesium: 2.2 mg/dL (ref 1.5–2.5)

## 2013-11-07 LAB — TSH: TSH: 3.71 u[IU]/mL (ref 0.350–4.500)

## 2013-11-07 MED ORDER — ONDANSETRON HCL 4 MG PO TABS: 4.0000 mg | ORAL_TABLET | Freq: Four times a day (QID) | ORAL | Status: DC | PRN

## 2013-11-07 MED ORDER — IOHEXOL 300 MG/ML  SOLN
80.0000 mL | Freq: Once | INTRAMUSCULAR | Status: AC | PRN
  Administered 2013-11-07: 80 mL via INTRAVENOUS

## 2013-11-07 MED ORDER — PHENOBARBITAL SODIUM 65 MG/ML IJ SOLN
32.5000 mg | Freq: Every day | INTRAMUSCULAR | Status: DC
  Administered 2013-11-07 – 2013-11-08 (×2): 32.5 mg via INTRAVENOUS
  Filled 2013-11-07 (×2): qty 1

## 2013-11-07 MED ORDER — ACETAMINOPHEN 650 MG RE SUPP: 650.0000 mg | Freq: Four times a day (QID) | RECTAL | Status: DC | PRN

## 2013-11-07 MED ORDER — PANTOPRAZOLE SODIUM 40 MG IV SOLR
40.0000 mg | INTRAVENOUS | Status: DC
  Administered 2013-11-07 – 2013-11-08 (×2): 40 mg via INTRAVENOUS
  Filled 2013-11-07 (×3): qty 40

## 2013-11-07 MED ORDER — ENOXAPARIN SODIUM 40 MG/0.4ML ~~LOC~~ SOLN
40.0000 mg | SUBCUTANEOUS | Status: DC
  Administered 2013-11-07 – 2013-11-09 (×3): 40 mg via SUBCUTANEOUS
  Filled 2013-11-07 (×4): qty 0.4

## 2013-11-07 MED ORDER — SODIUM CHLORIDE 0.9 % IV SOLN
INTRAVENOUS | Status: DC
  Administered 2013-11-07 (×2): via INTRAVENOUS

## 2013-11-07 MED ORDER — ONDANSETRON HCL 4 MG/2ML IJ SOLN: 4.0000 mg | Freq: Four times a day (QID) | INTRAMUSCULAR | Status: DC | PRN

## 2013-11-07 MED ORDER — PHENOBARBITAL SODIUM 65 MG/ML IJ SOLN
65.0000 mg | Freq: Every day | INTRAMUSCULAR | Status: DC
Start: 2013-11-07 — End: 2013-11-08
  Administered 2013-11-07: 65 mg via INTRAVENOUS
  Filled 2013-11-07: qty 1

## 2013-11-07 MED ORDER — TAMSULOSIN HCL 0.4 MG PO CAPS
0.4000 mg | ORAL_CAPSULE | Freq: Every day | ORAL | Status: DC
  Administered 2013-11-07 – 2013-11-10 (×4): 0.4 mg via ORAL
  Filled 2013-11-07 (×4): qty 1

## 2013-11-07 MED ORDER — MORPHINE SULFATE 2 MG/ML IJ SOLN: 2.0000 mg | INTRAMUSCULAR | Status: DC | PRN

## 2013-11-07 MED ORDER — ACETAMINOPHEN 325 MG PO TABS: 650.0000 mg | ORAL_TABLET | Freq: Four times a day (QID) | ORAL | Status: DC | PRN

## 2013-11-07 NOTE — Progress Notes (Signed)
MEDICATION RELATED CONSULT NOTE - INITIAL   Pharmacy Consult for Phenobarbital IV Indication: Hx seizures?  Allergies  Allergen Reactions  . Ofloxacin     Unsure   . Phenothiazines Other (See Comments)    Unknown    Patient Measurements: Height: 6\' 1"  (185.4 cm) Weight: 182 lb 3.2 oz (82.645 kg) IBW/kg (Calculated) : 79.9   Vital Signs: Temp: 98.6 F (37 C) (08/10 0351) Temp src: Axillary (08/10 0351) BP: 158/89 mmHg (08/10 0351) Pulse Rate: 70 (08/10 0351) Intake/Output from previous day:   Intake/Output from this shift:    Labs:  Recent Labs  11/06/13 2321  WBC 5.3  HGB 13.8  HCT 39.4  PLT 248  CREATININE 1.75*   Estimated Creatinine Clearance: 53.3 ml/min (by C-G formula based on Cr of 1.75).   Microbiology: No results found for this or any previous visit (from the past 720 hour(s)).  Medical History: Past Medical History  Diagnosis Date  . Autism spectrum disorder   . Hypercholesteremia   . Bowel obstruction   . Broken leg Left  . Osteoporosis   . Mental retardation   . Hyperlipidemia   . Malnutrition   . Thyroid disease   . CKD (chronic kidney disease) stage 3, GFR 30-59 ml/min 11/07/2013    Medications:  Scheduled:  . enoxaparin (LOVENOX) injection  40 mg Subcutaneous Q24H  . pantoprazole (PROTONIX) IV  40 mg Intravenous Q24H  . PHENObarbital  32.5 mg Intravenous Q1200  . PHENObarbital  65 mg Intravenous QHS   Infusions:  . sodium chloride 125 mL/hr at 11/07/13 0449    Assessment: 58 yo admitted with SBO on chronic phenobarbital for seizures?  Pt admitted with SBO.  Rx asked to covert po phenobarb to IV while pt NPO.  Goal of Therapy:  Prevent seizures  Plan:   Phenobarbital 65mg  qhs and 32.5mg  @ noon daily  F/U resume of po meds  Susanne GreenhouseGreen, Kalene Cutler R 11/07/2013,5:39 AM

## 2013-11-07 NOTE — Progress Notes (Signed)
Patient had very large loose bowel movement.  Will wait for x-ray results before administering soap suds enema.  Philomena Dohenyavid Ameen Mostafa RN

## 2013-11-07 NOTE — Progress Notes (Signed)
Pt arrived to floor room 1513 via stretcher, walked to bed with steady gait. VS taken, no c/o pain. Pt has autism and brother [name] also at the bedside. Will continue to monitor throughout shift. Initial; assessment completed

## 2013-11-07 NOTE — Evaluation (Signed)
Physical Therapy Evaluation Patient Details Name: Richard [name] MRN: [id number] DOB: 11/02/1955 Today's Date: 11/07/2013   History of Present Illness  58 y.o. male with autism spectrum disorder and mental retardation who is nonverbal admitted with SBO.   Clinical Impression  *Pt admitted with **SBO*. Pt currently with functional limitations due to the deficits listed below (see PT Problem List).  Pt will benefit from skilled PT to increase their independence and safety with mobility to allow discharge to the venue listed below.   Pt ambulated 220' with min hand held assist for balance. No family present to provide prior functional level, though sitter stated pt's brother stated that pt walks without assist at home.   **    Follow Up Recommendations No PT follow up    Equipment Recommendations  None recommended by PT    Recommendations for Other Services       Precautions / Restrictions Precautions Precautions: Other (comment) Precaution Comments: nonverbal Restrictions Weight Bearing Restrictions: No      Mobility  Bed Mobility Overal bed mobility: Modified Independent                Transfers Overall transfer level: Needs assistance Equipment used: None Transfers: Sit to/from Stand Sit to Stand: Min guard         General transfer comment: min/guard for safety/balance  Ambulation/Gait Ambulation/Gait assistance: Min assist Ambulation Distance (Feet): 220 Feet Assistive device: 1 person hand held assist;Quad cane     Gait velocity interpretation: at or above normal speed for age/gender General Gait Details: steady with min HHA  Stairs            Wheelchair Mobility    Modified Rankin (Stroke Patients Only)       Balance Overall balance assessment: Needs assistance   Sitting balance-Leahy Scale: Good       Standing balance-Leahy Scale: Fair                               Pertinent Vitals/Pain Pain Assessment:  Faces Faces Pain Scale: No hurt    Home Living Family/patient expects to be discharged to:: Private residence Living Arrangements: Other relatives (patient here from [place] and staying with his brother)               Additional Comments: no caregiver is present to obtain info in regards to the living situation.    Prior Function           Comments: has cargivers, ? level if independence re: ADL's. Pt is non communicative but does follow simple gesturing.     Hand Dominance        Extremity/Trunk Assessment   Upper Extremity Assessment: Defer to OT evaluation           Lower Extremity Assessment: Overall WFL for tasks assessed      Cervical / Trunk Assessment: Kyphotic  Communication   Communication: Expressive difficulties  Cognition Arousal/Alertness: Awake/alert Behavior During Therapy: WFL for tasks assessed/performed Overall Cognitive Status: History of cognitive impairments - at baseline                      General Comments      Exercises        Assessment/Plan    PT Assessment Patient needs continued PT services  PT Diagnosis Generalized weakness   PT Problem List Decreased balance;Decreased mobility  PT Treatment Interventions Gait training;Balance training   PT  Goals (Current goals can be found in the Care Plan section) Acute Rehab PT Goals Patient Stated Goal: pt nonverbal PT Goal Formulation: Patient unable to participate in goal setting Time For Goal Achievement: 11/21/13 Potential to Achieve Goals: Good    Frequency Min 3X/week   Barriers to discharge        Co-evaluation               End of Session Equipment Utilized During Treatment: Gait belt Activity Tolerance: Patient tolerated treatment well Patient left: in chair;with call bell/phone within reach;with nursing/sitter in room Nurse Communication: Mobility status         Time: 1610-9604 PT Time Calculation (min): 12 min   Charges:   PT  Evaluation $Initial PT Evaluation Tier I: 1 Procedure PT Treatments $Gait Training: 8-22 mins   PT G Codes:          Tamala Ser 11/07/2013, 11:57 AM [phone]

## 2013-11-07 NOTE — Progress Notes (Signed)
Pt admitted after midnight. Chart reviewed, pt examined. Discussed with CCS. Having stools. Foley catheter ordered, but voiding large amounts, so RN held insertion. May be constipation related. Will check PVR bladder scan and start flomax. Abd xray improved. UA pending.  Crista Curborinna Donnelle Rubey, MD [facility] [phone]

## 2013-11-07 NOTE — Consult Note (Signed)
Reason for Consult:  SBO/constipation Referring Physician:   Dr C. Conley Canal  CC:  No BM since 11/03/12, decreased PO intake, abdominal discomfort.  Richard [name] is an 58 y.o. male.  HPI: This is a 58 y/o Richard [name] visiting family from [place].  He has a history of constipation and SBO.  He has had a Volvulus and SBO requiring surgery in the past, in [place].  Records are not here.  He was seen here last year with similar presentation 02/24/13 -03/01/13.  He did well without surgery, but returned again on 03/16/13. He presented again with same issue and this resolved also with treatment of his dehydration and constipation. He was sent home on Miralax and had very loose stools after that according to the noe from DR. Hommel in Jan. 5 2015. He is back now with pain, described as tearing up and No BM according to ED record from 11/01/13.  Family is not here to confirm.  Since arrival here on the floor he has had a large loose BM, Nursing reports having to change out the  Whole bed.   He is afebrile, VSS, Labs are normal aside from some renal insuffiencey, but labs from last year shows his creatinine between 1.7-2.08.  A film is currently pending. CT scan  Shows a very large bladder, several loops SB distension up to 4.3 cm, possible transition point RLQ, large stool burden.   We are ask to see and follow.   Past Medical History  Diagnosis Date  . Autism spectrum disorder   . Hypercholesteremia   . Bowel obstruction   . Broken leg Left  . Osteoporosis   . Mental retardation   . Hyperlipidemia   . Malnutrition   . Thyroid disease   . CKD (chronic kidney disease) stage 3, GFR 30-59 ml/min 11/07/2013    Past Surgical History  Procedure Laterality Date  . Abdominal surgery Possible volvulus and SBO No documents here.in [place].  . Leg surgery    . Eye surgery Left 03-14-2013    catatract  . Appendectomy      Family History  Problem Relation Age of Onset  . Hypertension Other   . Diabetes  Other   . Hyperlipidemia Other   . Scoliosis Other     Social History:  reports that he has never smoked. He does not have any smokeless tobacco history on file. He reports that he does not drink alcohol. His drug history is not on file.  Allergies:  Allergies  Allergen Reactions  . Ofloxacin     Unsure   . Phenothiazines Other (See Comments)    Unknown    Medications:  Prior to Admission:  Prescriptions prior to admission  Medication Sig Dispense Refill  . calcium-vitamin D (OSCAL WITH D) 500-200 MG-UNIT per tablet Take 1 tablet by mouth 2 (two) times daily.      . cholecalciferol (VITAMIN D) 1000 UNITS tablet Take 2,000 Units by mouth daily.      Marland Kitchen docusate sodium (COLACE) 100 MG capsule Take 1 capsule (100 mg total) by mouth 2 (two) times daily. Patient is given this medication to help with constipation.  60 capsule  0  . magnesium hydroxide (MILK OF MAGNESIA) 400 MG/5ML suspension Take 45 mLs by mouth daily as needed for mild constipation.      . Multiple Vitamins-Minerals (MULTIVITAMIN WITH MINERALS) tablet Take 1 tablet by mouth daily.      Marland Kitchen PHENobarbital (LUMINAL) 32.4 MG tablet Take 32.4 mg by mouth daily  at 12 noon.      Marland Kitchen PHENobarbital (LUMINAL) 64.8 MG tablet Take 64.8 mg by mouth every evening.      . polyethylene glycol (MIRALAX / GLYCOLAX) packet Take 17 g by mouth every other day.      . simvastatin (ZOCOR) 40 MG tablet Take 40 mg by mouth every evening.       Scheduled: . enoxaparin (LOVENOX) injection  40 mg Subcutaneous Q24H  . pantoprazole (PROTONIX) IV  40 mg Intravenous Q24H  . PHENObarbital  32.5 mg Intravenous Q1200  . PHENObarbital  65 mg Intravenous QHS   Continuous: . sodium chloride 125 mL/hr at 11/07/13 0449   SKS:[id number], acetaminophen, morphine injection, ondansetron (ZOFRAN) IV, ondansetron Anti-infectives   None      Results for orders placed during the hospital encounter of 11/06/13 (from the past 48 hour(s))  CBC WITH  DIFFERENTIAL     Status: None   Collection Time    11/06/13 11:21 PM      Result Value Ref Range   WBC 5.3  4.0 - 10.5 K/uL   RBC 4.32  4.22 - 5.81 MIL/uL   Hemoglobin 13.8  13.0 - 17.0 g/dL   HCT 39.4  39.0 - 52.0 %   MCV 91.2  78.0 - 100.0 fL   MCH 31.9  26.0 - 34.0 pg   MCHC 35.0  30.0 - 36.0 g/dL   RDW 12.2  11.5 - 15.5 %   Platelets 248  150 - 400 K/uL   Neutrophils Relative % 56  43 - 77 %   Neutro Abs 2.9  1.7 - 7.7 K/uL   Lymphocytes Relative 32  12 - 46 %   Lymphs Abs 1.7  0.7 - 4.0 K/uL   Monocytes Relative 11  3 - 12 %   Monocytes Absolute 0.6  0.1 - 1.0 K/uL   Eosinophils Relative 1  0 - 5 %   Eosinophils Absolute 0.1  0.0 - 0.7 K/uL   Basophils Relative 0  0 - 1 %   Basophils Absolute 0.0  0.0 - 0.1 K/uL  BASIC METABOLIC PANEL     Status: Abnormal   Collection Time    11/06/13 11:21 PM      Result Value Ref Range   Sodium 137  137 - 147 mEq/L   Potassium 4.8  3.7 - 5.3 mEq/L   Chloride 97  96 - 112 mEq/L   CO2 27  19 - 32 mEq/L   Glucose, Bld 97  70 - 99 mg/dL   BUN 12  6 - 23 mg/dL   Creatinine, Ser 1.75 (*) 0.50 - 1.35 mg/dL   Calcium 9.7  8.4 - 10.5 mg/dL   GFR calc non Af Amer 42 (*) >90 mL/min   GFR calc Af Amer 48 (*) >90 mL/min   Comment: (NOTE)     The eGFR has been calculated using the CKD EPI equation.     This calculation has not been validated in all clinical situations.     eGFR's persistently <90 mL/min signify possible Chronic Kidney     Disease.   Anion gap 13  5 - 15  LIPASE, BLOOD     Status: Abnormal   Collection Time    11/06/13 11:21 PM      Result Value Ref Range   Lipase 75 (*) 11 - 59 U/L  MAGNESIUM     Status: None   Collection Time    11/07/13  5:35 AM  Result Value Ref Range   Magnesium 2.2  1.5 - 2.5 mg/dL  CBC     Status: None   Collection Time    11/07/13  5:35 AM      Result Value Ref Range   WBC 4.9  4.0 - 10.5 K/uL   RBC 4.32  4.22 - 5.81 MIL/uL   Hemoglobin 13.5  13.0 - 17.0 g/dL   HCT 39.8  39.0 - 52.0 %    MCV 92.1  78.0 - 100.0 fL   MCH 31.3  26.0 - 34.0 pg   MCHC 33.9  30.0 - 36.0 g/dL   RDW 12.4  11.5 - 15.5 %   Platelets 225  150 - 400 K/uL  COMPREHENSIVE METABOLIC PANEL     Status: Abnormal   Collection Time    11/07/13  5:35 AM      Result Value Ref Range   Sodium 137  137 - 147 mEq/L   Potassium 4.7  3.7 - 5.3 mEq/L   Chloride 99  96 - 112 mEq/L   CO2 26  19 - 32 mEq/L   Glucose, Bld 97  70 - 99 mg/dL   BUN 12  6 - 23 mg/dL   Creatinine, Ser 1.70 (*) 0.50 - 1.35 mg/dL   Calcium 9.4  8.4 - 10.5 mg/dL   Total Protein 6.8  6.0 - 8.3 g/dL   Albumin 4.1  3.5 - 5.2 g/dL   AST 20  0 - 37 U/L   ALT 12  0 - 53 U/L   Alkaline Phosphatase 103  39 - 117 U/L   Total Bilirubin 0.2 (*) 0.3 - 1.2 mg/dL   GFR calc non Af Amer 43 (*) >90 mL/min   GFR calc Af Amer 50 (*) >90 mL/min   Comment: (NOTE)     The eGFR has been calculated using the CKD EPI equation.     This calculation has not been validated in all clinical situations.     eGFR's persistently <90 mL/min signify possible Chronic Kidney     Disease.   Anion gap 12  5 - 15  PHOSPHORUS     Status: None   Collection Time    11/07/13  5:35 AM      Result Value Ref Range   Phosphorus 3.0  2.3 - 4.6 mg/dL  TSH     Status: None   Collection Time    11/07/13  5:35 AM      Result Value Ref Range   TSH 3.710  0.350 - 4.500 uIU/mL   Comment: Performed at [facility]  APTT     Status: None   Collection Time    11/07/13  5:35 AM      Result Value Ref Range   aPTT 28  24 - 37 seconds  PROTIME-INR     Status: None   Collection Time    11/07/13  5:35 AM      Result Value Ref Range   Prothrombin Time 13.8  11.6 - 15.2 seconds   INR 1.06  0.00 - 1.49  PHENOBARBITAL LEVEL     Status: None   Collection Time    11/07/13  5:35 AM      Result Value Ref Range   Phenobarbital 18.7  15.0 - 40.0 ug/mL   Comment: Performed at [facility]en Pelvis W Contrast  11/07/2013   CLINICAL DATA:  Pain, decreased  swelling, decreased appetite. Elevated lipase.  EXAM: CT  ABDOMEN AND PELVIS WITH CONTRAST  TECHNIQUE: Multidetector CT imaging of the abdomen and pelvis was performed using the standard protocol following bolus administration of intravenous contrast.  CONTRAST:  30m OMNIPAQUE IOHEXOL 300 MG/ML  SOLN  COMPARISON:  Prior CT from 02/24/2013  FINDINGS: Mild interstitial changes noted at the visualized lung bases.  Subcapsular wedge-shaped hypodensity within the hepatic dome is stable from prior. A few additional scattered subcentimeter hypodensities noted within the liver, too small the characterize. Gallbladder within normal limits. No biliary dilatation. The spleen and adrenal glands demonstrate a normal contrast enhanced appearance.  The pancreas is within normal limits. No significant peripancreatic inflammatory changes seen to suggest acute pancreatitis. No pancreatic ductal dilatation. Fairly homogeneous enhancement seen throughout the pancreatic parenchyma.  The right kidney is low-lying within the abdomen and externally rotated. Kidneys are otherwise unremarkable without evidence of nephrolithiasis, hydronephrosis, or focal enhancing renal mass.  Small hiatal hernia noted. Stomach otherwise unremarkable. Several mildly prominent contrast filled loops of small bowel are seen within the anterior and left abdomen, measuring up to 4.3 cm in diameter. The ileum is decompressed distally. There is question of a focal transition point within the right lower quadrant. Large amount of retained stool present throughout the colon. The colon itself is of normal caliber.  Bladder well distended but otherwise unremarkable. Enlarged prostate again noted.  No free air or fluid. No pathologically enlarged intra-abdominal pelvic lymph nodes. Right inguinal hernia containing intermediate fluid density is noted. Normal intravascular enhancement seen within the abdomen and pelvis.  No acute osseous abnormality. No worrisome lytic or  blastic osseous lesions.  IMPRESSION: 1. Scattered mildly dilated loops of small bowel with associated air-fluid levels within the upper mid and left abdomen with decompressed ileum distally there is question of focal transition point in the right lower quadrant. Findings are suggestive of possible early or partial small bowel obstruction. Clinical correlation recommended. 2. Large amount of retained stool diffusely throughout the colon, suggesting constipation. 3. No other acute intra-abdominal or pelvic process.   Electronically Signed   By: BJeannine BogaM.D.   On: 11/07/2013 01:45   Dg Abd Acute W/chest  11/06/2013   CLINICAL DATA:  Abdominal pain.  EXAM: ACUTE ABDOMEN SERIES (ABDOMEN 2 VIEW & CHEST 1 VIEW)  COMPARISON:  Abdominal radiograph from 03/18/2013  FINDINGS: The lungs are well-aerated. Minimal bibasilar airspace opacities likely reflect atelectasis. There is no evidence of focal pleural effusion or pneumothorax. The cardiomediastinal silhouette is enlarged.  Diffusely air-filled loops of small and large bowel are seen, with distention of the colon to the 8.5 cm. This likely reflects some degree of ileus. There is no definite evidence of distal obstruction. No free intra-abdominal air is identified on the provided upright view.  No acute osseous abnormalities are seen; the sacroiliac joints are unremarkable in appearance. Minimal right convex thoracic scoliosis is noted.  IMPRESSION: 1. Diffuse distention of small and large bowel loops with air, likely reflecting some degree of ileus. No definite evidence of bowel obstruction. No free intra-abdominal air seen. 2. Minimal bibasilar airspace opacities likely reflect atelectasis; cardiomegaly noted.   Electronically Signed   By: JGarald BaldingM.D.   On: 11/06/2013 22:22    Review of Systems  Unable to perform ROS: patient nonverbal   Blood pressure 158/89, pulse 70, temperature 98.6 F (37 C), temperature source Axillary, resp. rate 18,  height _0  (1.854 m), weight 82.645 kg (182 lb 3.2 oz), SpO2 97.00%. Physical Exam  Constitutional: He appears well-developed  and well-nourished. No distress.  Non verbal with autism, does not want me to do much.  He did allow me to examine stomach, but tried to push me away.  Did better with some diverting.  HENT:  Head: Normocephalic.  Left Ear: External ear normal.  Eyes:  He has sunglasses on.  Neck: Normal range of motion. Neck supple. No JVD present.  Cardiovascular: Normal rate, regular rhythm, normal heart sounds and intact distal pulses.  Exam reveals no gallop.   No murmur heard. Respiratory: Effort normal and breath sounds normal. No respiratory distress. He has no wheezes. He has no rales. He exhibits no tenderness.  GI: Soft. He exhibits distension (some distension). He exhibits no mass. There is no tenderness. There is no rebound and no guarding.  Few BS,  He just had a large BM  Musculoskeletal: He exhibits no edema.  Neurological: He is alert.  Skin: Skin is warm and dry. No rash noted. He is not diaphoretic. No erythema. No pallor.  Psychiatric:  Pt is nonverbal and Richard. He would allow some exam with diversion.    Assessment/Plan: 1.  SBO vs constipation with hx of SBO/constipation and prior bowel surgery. 2.  Autism spectrum disorder/Mental retardation 3.  Mild renal insuffiencey 4.  Hx of thyroid condition 5.  Dyslipidemia  Plan:  He appears a bit distended, but no significant discomfort as far as I can tell.  He has just had a large BM in bed.  He has a film pending.  I would follow conservatively for now.  See how he progresses.  He had same type of issue last year.    JENNINGS,WILLARD 11/07/2013, 8:42 AM   Agree with above. He has had a couple of large BM's today.  His abdomen is fairly soft.  No localized tenderness. The patient does not communicate, but comes to live with his brother, Scotty, for 2 months out of the year.  Otherwise he lives in  [place].  Alphonsa Overall, MD, [facility] Pager: [phone] Office phone:  [phone]

## 2013-11-07 NOTE — H&P (Signed)
[facility] History and Physical  Richard [name] ZOX:[id number] DOB: 1955/04/07 DOA: 11/06/2013  Referring physician: Dr Wilkie Aye PCP: [name], MD   Chief Complaint: Abdominal pain  HPI: Richard [name] is a 58 y.o. male  With history of autism spectrum disorder who is nonverbal, mental retardation, hyperlipidemia, prior history of small bowel obstruction resulting in surgery secondary to adhesions in 1992 or 1994 Richard [name]. Patient has had 2 prior abdominal surgeries in [place] and one possibly for volvulus. Patient was on the surgical service and discharged on 03/01/2013 after being treated conservatively for small bowel obstruction. Patient subsequently admitted under the hospitalist service from 03/16/2013 to 03/18/2013 for abdominal pain felt to be secondary to constipation versus ileus. Patient currently living with his [name] who he visits 2 months out of the year from [place]. Patient is present in with a 3 day history of some abdominal discomfort and, no bowel movement, some chills. Richard [name] patient's last bowel movement was on 11/03/2013 after receiving milk of magnesia and since then no bowel movement. One day prior to admission patient with also decreased oral intake. Richard [name] no nausea, no vomiting, no fever, no chest pain, no shortness of breath, no constipation, no dysuria, no weakness, no melena, no hematemesis, no hematochezia, no cough. Patient was seen in the emergency room basic metabolic profile done had a creatinine of 1.75 otherwise was within normal limits. Lipase level was 75. CBC was within normal limits. Acute abdominal series had diffuse distention of small and large bowel loops with air likely reflecting some degree of ileus. No definite evidence of bowel obstruction. No free intra-abdominal air is seen. Minimal by basilar space opacities may reflect atelectasis. Cardiomegaly noted. CT of the abdomen and pelvis showed scattered mildly dilated loops of  small bowel with associated air-fluid levels within the upper mid and left abdomen with decompressed ileum distally and a question of focal transition point in the right lower quadrant findings suggestive of possible early or partial small bowel obstruction. Large amount of retained stool diffuse bleeding throughout the colon suggesting constipation. Other acute intra-abdominal or pelvic process. We were called to admit the patient for further evaluation and management.      Review of Systems: As per history of present illness otherwise negative. Constitutional:  No weight loss, night sweats, Fevers, chills, fatigue.  HEENT:  No headaches, Difficulty swallowing,Tooth/dental problems,Sore throat,  No sneezing, itching, ear ache, nasal congestion, post nasal drip,  Cardio-vascular:  No chest pain, Orthopnea, PND, swelling in lower extremities, anasarca, dizziness, palpitations  GI:  No heartburn, indigestion, abdominal pain, nausea, vomiting, diarrhea, change in bowel habits, loss of appetite  Resp:  No shortness of breath with exertion or at rest. No excess mucus, no productive cough, No non-productive cough, No coughing up of blood.No change in color of mucus.No wheezing.No chest wall deformity  Skin:  no rash or lesions.  GU:  no dysuria, change in color of urine, no urgency or frequency. No flank pain.  Musculoskeletal:  No joint pain or swelling. No decreased range of motion. No back pain.  Psych:  No change in mood or affect. No depression or anxiety. No memory loss.   Past Medical History  Diagnosis Date  . Autism spectrum disorder   . Hypercholesteremia   . Bowel obstruction   . Broken leg Left  . Osteoporosis   . Mental retardation   . Hyperlipidemia   . Malnutrition   . Thyroid disease   . CKD (chronic kidney disease) stage 3, GFR  30-59 ml/min 11/07/2013   Past Surgical History  Procedure Laterality Date  . Abdominal surgery    . Leg surgery    . Eye surgery Left  03-14-2013    catatract  . Appendectomy     Social History:  reports that he has never smoked. He does not have any smokeless tobacco history on file. He reports that he does not drink alcohol. His drug history is not on file.  Allergies  Allergen Reactions  . Ofloxacin     Unsure   . Phenothiazines Other (See Comments)    Unknown    Family History  Problem Relation Age of Onset  . Hypertension Other   . Diabetes Other   . Hyperlipidemia Other   . Scoliosis Other      Prior to Admission medications   Medication Sig Start Date End Date Taking? Authorizing Provider  calcium-vitamin D (OSCAL WITH D) 500-200 MG-UNIT per tablet Take 1 tablet by mouth 2 (two) times daily.   Yes Historical Provider, MD  cholecalciferol (VITAMIN D) 1000 UNITS tablet Take 2,000 Units by mouth daily.   Yes Historical Provider, MD  docusate sodium (COLACE) 100 MG capsule Take 1 capsule (100 mg total) by mouth 2 (two) times daily. Patient is given this medication to help with constipation. 03/18/13  Yes Jeralyn BennettEzequiel Zamora, MD  magnesium hydroxide (MILK OF MAGNESIA) 400 MG/5ML suspension Take 45 mLs by mouth daily as needed for mild constipation.   Yes Historical Provider, MD  Multiple Vitamins-Minerals (MULTIVITAMIN WITH MINERALS) tablet Take 1 tablet by mouth daily.   Yes Historical Provider, MD  PHENobarbital (LUMINAL) 32.4 MG tablet Take 32.4 mg by mouth daily at 12 noon.   Yes Historical Provider, MD  PHENobarbital (LUMINAL) 64.8 MG tablet Take 64.8 mg by mouth every evening.   Yes Historical Provider, MD  polyethylene glycol (MIRALAX / GLYCOLAX) packet Take 17 g by mouth every other day.   Yes Historical Provider, MD  simvastatin (ZOCOR) 40 MG tablet Take 40 mg by mouth every evening.   Yes Historical Provider, MD   Physical Exam: Filed Vitals:   11/06/13 2055 11/07/13 0014 11/07/13 0310 11/07/13 0351  BP: 153/104 157/93 154/93 158/89  Pulse: 87 60 68 70  Temp: 98.3 F (36.8 C) 98.4 F (36.9 C) 98.6  F (37 C) 98.6 F (37 C)  TempSrc: Oral Axillary Axillary Axillary  Resp: 20 16 20 18   Height:    6\' 1"  (1.854 m)  Weight: 77.111 kg (170 lb)   82.645 kg (182 lb 3.2 oz)  SpO2: 95% 99% 98% 97%    Wt Readings from Last 3 Encounters:  11/07/13 82.645 kg (182 lb 3.2 oz)  04/04/13 77.565 kg (171 lb)  03/16/13 80.332 kg (177 lb 1.6 oz)    General:  Well-developed well-nourished nonverbal gentleman laying on the gurney in no acute cardiopulmonary distress. Patient laying in the fetal position.  Eyes: PERRLA, EOMI, normal lids, irises & conjunctiva. Dry mucous membranes ENT: Clear, no lesions, no exudates.  Neck: no LAD, masses or thyromegaly Cardiovascular: RRR, no m/r/g. No LE edema. Respiratory: CTA bilaterally, no w/r/r. Normal respiratory effort. Abdomen: soft, nondistended, hyperactive bowel sounds, some mild tenderness to palpation in the lower quadrants. Skin: no rash or induration seen on limited exam Musculoskeletal: grossly normal tone BUE/BLE Psychiatric: grossly normal mood and affect. Patient is non-verbal. Neurologic: Alert. Patient nonverbal. Ardelia MemsUnable to assess full neurological exam. Patient moving extremities spontaneously and bilaterally.  Labs on Admission:  Basic Metabolic Panel:  Recent Labs Lab 11/06/13 2321  NA 137  K 4.8  CL 97  CO2 27  GLUCOSE 97  BUN 12  CREATININE 1.75*  CALCIUM 9.7   Liver Function Tests: No results found for this basename: AST, ALT, ALKPHOS, BILITOT, PROT, ALBUMIN,  in the last 168 hours  Recent Labs Lab 11/06/13 2321  LIPASE 75*   No results found for this basename: AMMONIA,  in the last 168 hours CBC:  Recent Labs Lab 11/06/13 2321  WBC 5.3  NEUTROABS 2.9  HGB 13.8  HCT 39.4  MCV 91.2  PLT 248   Cardiac Enzymes: No results found for this basename: CKTOTAL, CKMB, CKMBINDEX, TROPONINI,  in the last 168 hours  BNP (last 3 results) No results found for this basename: PROBNP,  in the last 8760  hours CBG: No results found for this basename: GLUCAP,  in the last 168 hours  Radiological Exams on Admission: Ct Abdomen Pelvis W Contrast  11/07/2013   CLINICAL DATA:  Pain, decreased swelling, decreased appetite. Elevated lipase.  EXAM: CT ABDOMEN AND PELVIS WITH CONTRAST  TECHNIQUE: Multidetector CT imaging of the abdomen and pelvis was performed using the standard protocol following bolus administration of intravenous contrast.  CONTRAST:  80mL OMNIPAQUE IOHEXOL 300 MG/ML  SOLN  COMPARISON:  Prior CT from 02/24/2013  FINDINGS: Mild interstitial changes noted at the visualized lung bases.  Subcapsular wedge-shaped hypodensity within the hepatic dome is stable from prior. A few additional scattered subcentimeter hypodensities noted within the liver, too small the characterize. Gallbladder within normal limits. No biliary dilatation. The spleen and adrenal glands demonstrate a normal contrast enhanced appearance.  The pancreas is within normal limits. No significant peripancreatic inflammatory changes seen to suggest acute pancreatitis. No pancreatic ductal dilatation. Fairly homogeneous enhancement seen throughout the pancreatic parenchyma.  The right kidney is low-lying within the abdomen and externally rotated. Kidneys are otherwise unremarkable without evidence of nephrolithiasis, hydronephrosis, or focal enhancing renal mass.  Small hiatal hernia noted. Stomach otherwise unremarkable. Several mildly prominent contrast filled loops of small bowel are seen within the anterior and left abdomen, measuring up to 4.3 cm in diameter. The ileum is decompressed distally. There is question of a focal transition point within the right lower quadrant. Large amount of retained stool present throughout the colon. The colon itself is of normal caliber.  Bladder well distended but otherwise unremarkable. Enlarged prostate again noted.  No free air or fluid. No pathologically enlarged intra-abdominal pelvic lymph  nodes. Right inguinal hernia containing intermediate fluid density is noted. Normal intravascular enhancement seen within the abdomen and pelvis.  No acute osseous abnormality. No worrisome lytic or blastic osseous lesions.  IMPRESSION: 1. Scattered mildly dilated loops of small bowel with associated air-fluid levels within the upper mid and left abdomen with decompressed ileum distally there is question of focal transition point in the right lower quadrant. Findings are suggestive of possible early or partial small bowel obstruction. Clinical correlation recommended. 2. Large amount of retained stool diffusely throughout the colon, suggesting constipation. 3. No other acute intra-abdominal or pelvic process.   Electronically Signed   By: Rise Mu M.D.   On: 11/07/2013 01:45   Dg Abd Acute W/chest  11/06/2013   CLINICAL DATA:  Abdominal pain.  EXAM: ACUTE ABDOMEN SERIES (ABDOMEN 2 VIEW & CHEST 1 VIEW)  COMPARISON:  Abdominal radiograph from 03/18/2013  FINDINGS: The lungs are well-aerated. Minimal bibasilar airspace opacities likely reflect atelectasis. There is  no evidence of focal pleural effusion or pneumothorax. The cardiomediastinal silhouette is enlarged.  Diffusely air-filled loops of small and large bowel are seen, with distention of the colon to the 8.5 cm. This likely reflects some degree of ileus. There is no definite evidence of distal obstruction. No free intra-abdominal air is identified on the provided upright view.  No acute osseous abnormalities are seen; the sacroiliac joints are unremarkable in appearance. Minimal right convex thoracic scoliosis is noted.  IMPRESSION: 1. Diffuse distention of small and large bowel loops with air, likely reflecting some degree of ileus. No definite evidence of bowel obstruction. No free intra-abdominal air seen. 2. Minimal bibasilar airspace opacities likely reflect atelectasis; cardiomegaly noted.   Electronically Signed   By: Roanna Raider M.D.    On: 11/06/2013 22:22    EKG: None  Assessment/Plan Principal Problem:   SBO (small bowel obstruction): PROBABLE PARTIAL VS EARLY Active Problems:   Other and unspecified hyperlipidemia   Osteoporosis, unspecified   History of seizures   Autism disorder   MR (mental retardation)   Generalized abdominal pain   Unspecified constipation   CKD (chronic kidney disease) stage 3, GFR 30-59 ml/min   #1 probable partial versus early small bowel obstruction May be likely secondary to adhesions. Patient with prior history of small bowel obstructions in the past and per [name] has had surgery in [place] felt secondary to adhesions in 1992 or 1994. Will admit the patient to a MedSurg floor. Place on bowel rest. IV fluids. Supportive care. Keep potassium greater than 4. Check a magnesium level and keep greater than 2. Mobilization. Serial KUBs. Surgery has been consulted per ED physician and Dr. Abbey Chatters will assess the patient later on this morning.  #2?? History of seizures Check phenobarbital level. Place on IV phenobarbital until tolerating oral intake and #1 has resolved.  #3 constipation As noted on CT scan. Will give one soapsuds enema. Follow.  #4 chronic kidney disease stage III Stable.  #5 ?? Elevated blood pressure Patient's [name] stated that patient was started on lisinopril for elevated blood pressure however due to systolic blood pressures in the 90s lisinopril has been held. Continue to hold lisinopril for now and follow.  #6 generalized abdominal pain Likely secondary to problem #1.  #7 Autism/mental retardation Stable. Will place to sit at bedside.  #8 hyperlipidemia Hold statin.  #9 osteoporosis Will hold calcium and vitamin D for now.  #10 prophylaxis PPI for GI prophylaxis. Lovenox for DVT prophylaxis.   Code Status: Full DVT Prophylaxis: Lovenox Family Communication: Updated [name] Caeson Filippi at bedside. Disposition Plan: Admit to MedSurg  Time  spent: 65 minutes  THOMPSON,DANIEL M.D. [facility] Pager [phone]  **Disclaimer: This note may have been dictated with voice recognition software. Similar sounding words can inadvertently be transcribed and this note may contain transcription errors which may not have been corrected upon publication of note.**

## 2013-11-07 NOTE — ED Notes (Signed)
Patient transported to CT 

## 2013-11-07 NOTE — Progress Notes (Signed)
Post void residual for patient was 225 ml's.  Philomena Dohenyavid Tanika Bracco RN

## 2013-11-07 NOTE — Progress Notes (Signed)
Patient had 999 ml's on bladder scan.  After MD notified, patient was able to spontaneously void.  Patient was incontinent in bed, and also did 800 cc's after that.  Will continue to reevaluate need for foley catheter.  Philomena Dohenyavid Kito Cuffe RN

## 2013-11-07 NOTE — ED Provider Notes (Signed)
Signout by Dr. Effie Shy pending CT.  CT with evidence of early small bowel structure. History of the same. Not currently vomiting. History of difficulty with NG tube placement secondary to autism. Discuss with Dr. Abbey Chatters, Gen. surgery. We'll admit for medical management.  Results for orders placed during the hospital encounter of 11/06/13  CBC WITH DIFFERENTIAL      Result Value Ref Range   WBC 5.3  4.0 - 10.5 K/uL   RBC 4.32  4.22 - 5.81 MIL/uL   Hemoglobin 13.8  13.0 - 17.0 g/dL   HCT 62.9  52.8 - 41.3 %   MCV 91.2  78.0 - 100.0 fL   MCH 31.9  26.0 - 34.0 pg   MCHC 35.0  30.0 - 36.0 g/dL   RDW 24.4  01.0 - 27.2 %   Platelets 248  150 - 400 K/uL   Neutrophils Relative % 56  43 - 77 %   Neutro Abs 2.9  1.7 - 7.7 K/uL   Lymphocytes Relative 32  12 - 46 %   Lymphs Abs 1.7  0.7 - 4.0 K/uL   Monocytes Relative 11  3 - 12 %   Monocytes Absolute 0.6  0.1 - 1.0 K/uL   Eosinophils Relative 1  0 - 5 %   Eosinophils Absolute 0.1  0.0 - 0.7 K/uL   Basophils Relative 0  0 - 1 %   Basophils Absolute 0.0  0.0 - 0.1 K/uL  BASIC METABOLIC PANEL      Result Value Ref Range   Sodium 137  137 - 147 mEq/L   Potassium 4.8  3.7 - 5.3 mEq/L   Chloride 97  96 - 112 mEq/L   CO2 27  19 - 32 mEq/L   Glucose, Bld 97  70 - 99 mg/dL   BUN 12  6 - 23 mg/dL   Creatinine, Ser 5.36 (*) 0.50 - 1.35 mg/dL   Calcium 9.7  8.4 - 64.4 mg/dL   GFR calc non Af Amer 42 (*) >90 mL/min   GFR calc Af Amer 48 (*) >90 mL/min   Anion gap 13  5 - 15  LIPASE, BLOOD      Result Value Ref Range   Lipase 75 (*) 11 - 59 U/L  MAGNESIUM      Result Value Ref Range   Magnesium 2.2  1.5 - 2.5 mg/dL  CBC      Result Value Ref Range   WBC 4.9  4.0 - 10.5 K/uL   RBC 4.32  4.22 - 5.81 MIL/uL   Hemoglobin 13.5  13.0 - 17.0 g/dL   HCT 03.4  74.2 - 59.5 %   MCV 92.1  78.0 - 100.0 fL   MCH 31.3  26.0 - 34.0 pg   MCHC 33.9  30.0 - 36.0 g/dL   RDW 63.8  75.6 - 43.3 %   Platelets 225  150 - 400 K/uL  APTT      Result Value Ref  Range   aPTT 28  24 - 37 seconds  PROTIME-INR      Result Value Ref Range   Prothrombin Time 13.8  11.6 - 15.2 seconds   INR 1.06  0.00 - 1.49   Ct Abdomen Pelvis W Contrast  11/07/2013   CLINICAL DATA:  Pain, decreased swelling, decreased appetite. Elevated lipase.  EXAM: CT ABDOMEN AND PELVIS WITH CONTRAST  TECHNIQUE: Multidetector CT imaging of the abdomen and pelvis was performed using the standard protocol following bolus administration of intravenous contrast.  CONTRAST:  80mL OMNIPAQUE IOHEXOL 300 MG/ML  SOLN  COMPARISON:  Prior CT from 02/24/2013  FINDINGS: Mild interstitial changes noted at the visualized lung bases.  Subcapsular wedge-shaped hypodensity within the hepatic dome is stable from prior. A few additional scattered subcentimeter hypodensities noted within the liver, too small the characterize. Gallbladder within normal limits. No biliary dilatation. The spleen and adrenal glands demonstrate a normal contrast enhanced appearance.  The pancreas is within normal limits. No significant peripancreatic inflammatory changes seen to suggest acute pancreatitis. No pancreatic ductal dilatation. Fairly homogeneous enhancement seen throughout the pancreatic parenchyma.  The right kidney is low-lying within the abdomen and externally rotated. Kidneys are otherwise unremarkable without evidence of nephrolithiasis, hydronephrosis, or focal enhancing renal mass.  Small hiatal hernia noted. Stomach otherwise unremarkable. Several mildly prominent contrast filled loops of small bowel are seen within the anterior and left abdomen, measuring up to 4.3 cm in diameter. The ileum is decompressed distally. There is question of a focal transition point within the right lower quadrant. Large amount of retained stool present throughout the colon. The colon itself is of normal caliber.  Bladder well distended but otherwise unremarkable. Enlarged prostate again noted.  No free air or fluid. No pathologically enlarged  intra-abdominal pelvic lymph nodes. Right inguinal hernia containing intermediate fluid density is noted. Normal intravascular enhancement seen within the abdomen and pelvis.  No acute osseous abnormality. No worrisome lytic or blastic osseous lesions.  IMPRESSION: 1. Scattered mildly dilated loops of small bowel with associated air-fluid levels within the upper mid and left abdomen with decompressed ileum distally there is question of focal transition point in the right lower quadrant. Findings are suggestive of possible early or partial small bowel obstruction. Clinical correlation recommended. 2. Large amount of retained stool diffusely throughout the colon, suggesting constipation. 3. No other acute intra-abdominal or pelvic process.   Electronically Signed   By: Rise MuBenjamin  McClintock M.D.   On: 11/07/2013 01:45   Dg Abd Acute W/chest  11/06/2013   CLINICAL DATA:  Abdominal pain.  EXAM: ACUTE ABDOMEN SERIES (ABDOMEN 2 VIEW & CHEST 1 VIEW)  COMPARISON:  Abdominal radiograph from 03/18/2013  FINDINGS: The lungs are well-aerated. Minimal bibasilar airspace opacities likely reflect atelectasis. There is no evidence of focal pleural effusion or pneumothorax. The cardiomediastinal silhouette is enlarged.  Diffusely air-filled loops of small and large bowel are seen, with distention of the colon to the 8.5 cm. This likely reflects some degree of ileus. There is no definite evidence of distal obstruction. No free intra-abdominal air is identified on the provided upright view.  No acute osseous abnormalities are seen; the sacroiliac joints are unremarkable in appearance. Minimal right convex thoracic scoliosis is noted.  IMPRESSION: 1. Diffuse distention of small and large bowel loops with air, likely reflecting some degree of ileus. No definite evidence of bowel obstruction. No free intra-abdominal air seen. 2. Minimal bibasilar airspace opacities likely reflect atelectasis; cardiomegaly noted.   Electronically Signed    By: Roanna RaiderJeffery  Chang M.D.   On: 11/06/2013 22:22      Shon Batonourtney F Zriyah Kopplin, MD 11/07/13 [id number]

## 2013-11-08 DIAGNOSIS — R339 Retention of urine, unspecified: Secondary | ICD-10-CM | POA: Diagnosis present

## 2013-11-08 LAB — URINE CULTURE
Colony Count: NO GROWTH
Culture: NO GROWTH

## 2013-11-08 MED ORDER — PHENOBARBITAL 32.4 MG PO TABS
32.4000 mg | ORAL_TABLET | Freq: Every day | ORAL | Status: DC
  Administered 2013-11-09 – 2013-11-10 (×2): 32.4 mg via ORAL
  Filled 2013-11-08 (×2): qty 1

## 2013-11-08 MED ORDER — POLYETHYLENE GLYCOL 3350 17 G PO PACK
17.0000 g | PACK | Freq: Every day | ORAL | Status: DC
  Administered 2013-11-08 – 2013-11-09 (×2): 17 g via ORAL
  Filled 2013-11-08 (×2): qty 1

## 2013-11-08 MED ORDER — PHENOBARBITAL 32.4 MG PO TABS
64.8000 mg | ORAL_TABLET | Freq: Every evening | ORAL | Status: DC
  Administered 2013-11-08 – 2013-11-09 (×2): 64.8 mg via ORAL
  Filled 2013-11-08 (×2): qty 2

## 2013-11-08 NOTE — Progress Notes (Signed)
[facility] PROGRESS NOTE  Richard PaganiniCharles Koke ZOX:[id number] DOB: 24-Dec-1955 DOA: 11/06/2013 PCP: Nani GasserMETHENEY,CATHERINE, MD  Assessment/Plan:  Principal Problem:   SBO (small bowel obstruction): PROBABLE PARTIAL VS EARLY v constipation: resolving. Tolerating clears. Saline lock. Active Problems:   Other and unspecified hyperlipidemia   History of seizures: change phenobarbitol to po   Autism disorder   MR (mental retardation)   Unspecified constipation: resume daily miralax   CKD (chronic kidney disease) stage 3, GFR 30-59 ml/min   Urine retention likely related to above. Voiding fine. PVR 200. Continue flomax. UA negative   Code Status:  full Family Communication:  Sister in Social workerlaw at bedside Disposition Plan:  home  Consultants:  CCS  Procedures:     Antibiotics:    HPI/Subjective: Unable. Per sitter, voiding ok. 2 large stools 8/10, none today  Objective: Filed Vitals:   11/08/13 1231  BP: 127/72  Pulse: 75  Temp: 98.1 F (36.7 C)  Resp: 18    Intake/Output Summary (Last 24 hours) at 11/08/13 1323 Last data filed at 11/08/13 1246  Gross per 24 hour  Intake 2674.58 ml  Output   2375 ml  Net 299.58 ml   Filed Weights   11/06/13 2055 11/07/13 0351  Weight: 77.111 kg (170 lb) 82.645 kg (182 lb 3.2 oz)    Exam:   General:  Asleep. Arousable. comfortable  Cardiovascular: RRR without MGR  Respiratory: CTA without WRR  Abdomen: S, NT, ND. Bowel sounds present  Ext: no CCE  Basic Metabolic Panel:  Recent Labs Lab 11/06/13 2321 11/07/13 0535  NA 137 137  K 4.8 4.7  CL 97 99  CO2 27 26  GLUCOSE 97 97  BUN 12 12  CREATININE 1.75* 1.70*  CALCIUM 9.7 9.4  MG  --  2.2  PHOS  --  3.0   Liver Function Tests:  Recent Labs Lab 11/07/13 0535  AST 20  ALT 12  ALKPHOS 103  BILITOT 0.2*  PROT 6.8  ALBUMIN 4.1    Recent Labs Lab 11/06/13 2321  LIPASE 75*   No results found for this basename: AMMONIA,  in the last 168  hours CBC:  Recent Labs Lab 11/06/13 2321 11/07/13 0535  WBC 5.3 4.9  NEUTROABS 2.9  --   HGB 13.8 13.5  HCT 39.4 39.8  MCV 91.2 92.1  PLT 248 225   Cardiac Enzymes: No results found for this basename: CKTOTAL, CKMB, CKMBINDEX, TROPONINI,  in the last 168 hours BNP (last 3 results) No results found for this basename: PROBNP,  in the last 8760 hours CBG: No results found for this basename: GLUCAP,  in the last 168 hours  No results found for this or any previous visit (from the past 240 hour(s)).   Studies: Ct Abdomen Pelvis W Contrast  11/07/2013   CLINICAL DATA:  Pain, decreased swelling, decreased appetite. Elevated lipase.  EXAM: CT ABDOMEN AND PELVIS WITH CONTRAST  TECHNIQUE: Multidetector CT imaging of the abdomen and pelvis was performed using the standard protocol following bolus administration of intravenous contrast.  CONTRAST:  80mL OMNIPAQUE IOHEXOL 300 MG/ML  SOLN  COMPARISON:  Prior CT from 02/24/2013  FINDINGS: Mild interstitial changes noted at the visualized lung bases.  Subcapsular wedge-shaped hypodensity within the hepatic dome is stable from prior. A few additional scattered subcentimeter hypodensities noted within the liver, too small the characterize. Gallbladder within normal limits. No biliary dilatation. The spleen and adrenal glands demonstrate a normal contrast enhanced appearance.  The pancreas is within normal limits. No  significant peripancreatic inflammatory changes seen to suggest acute pancreatitis. No pancreatic ductal dilatation. Fairly homogeneous enhancement seen throughout the pancreatic parenchyma.  The right kidney is low-lying within the abdomen and externally rotated. Kidneys are otherwise unremarkable without evidence of nephrolithiasis, hydronephrosis, or focal enhancing renal mass.  Small hiatal hernia noted. Stomach otherwise unremarkable. Several mildly prominent contrast filled loops of small bowel are seen within the anterior and left abdomen,  measuring up to 4.3 cm in diameter. The ileum is decompressed distally. There is question of a focal transition point within the right lower quadrant. Large amount of retained stool present throughout the colon. The colon itself is of normal caliber.  Bladder well distended but otherwise unremarkable. Enlarged prostate again noted.  No free air or fluid. No pathologically enlarged intra-abdominal pelvic lymph nodes. Right inguinal hernia containing intermediate fluid density is noted. Normal intravascular enhancement seen within the abdomen and pelvis.  No acute osseous abnormality. No worrisome lytic or blastic osseous lesions.  IMPRESSION: 1. Scattered mildly dilated loops of small bowel with associated air-fluid levels within the upper mid and left abdomen with decompressed ileum distally there is question of focal transition point in the right lower quadrant. Findings are suggestive of possible early or partial small bowel obstruction. Clinical correlation recommended. 2. Large amount of retained stool diffusely throughout the colon, suggesting constipation. 3. No other acute intra-abdominal or pelvic process.   Electronically Signed   By: Richard Mu M.D.   On: 11/07/2013 01:45   Dg Abd Acute W/chest  11/06/2013   CLINICAL DATA:  Abdominal pain.  EXAM: ACUTE ABDOMEN SERIES (ABDOMEN 2 VIEW & CHEST 1 VIEW)  COMPARISON:  Abdominal radiograph from 03/18/2013  FINDINGS: The lungs are well-aerated. Minimal bibasilar airspace opacities likely reflect atelectasis. There is no evidence of focal pleural effusion or pneumothorax. The cardiomediastinal silhouette is enlarged.  Diffusely air-filled loops of small and large bowel are seen, with distention of the colon to the 8.5 cm. This likely reflects some degree of ileus. There is no definite evidence of distal obstruction. No free intra-abdominal air is identified on the provided upright view.  No acute osseous abnormalities are seen; the sacroiliac joints are  unremarkable in appearance. Minimal right convex thoracic scoliosis is noted.  IMPRESSION: 1. Diffuse distention of small and large bowel loops with air, likely reflecting some degree of ileus. No definite evidence of bowel obstruction. No free intra-abdominal air seen. 2. Minimal bibasilar airspace opacities likely reflect atelectasis; cardiomegaly noted.   Electronically Signed   By: Roanna Raider M.D.   On: 11/06/2013 22:22   Dg Abd Portable 1v  11/07/2013   CLINICAL DATA:  Follow-up small bowel obstruction.  EXAM: PORTABLE ABDOMEN - 1 VIEW  COMPARISON:  CT 11/07/2013  FINDINGS: Supine images of the abdomen were obtained. Oral contrast has advanced into the colon. There continues to be gas filled loops of bowel, particularly in the left abdomen. Limited evaluation for free air on these supine images. The urinary bladder is markedly distended and contains contrast.  IMPRESSION: Oral contrast has advanced into the colon. No evidence for a high-grade obstruction. The bowel gas pattern is nonspecific.  Massive distension of the urinary bladder.   Electronically Signed   By: Richarda Overlie M.D.   On: 11/07/2013 09:13    Scheduled Meds: . enoxaparin (LOVENOX) injection  40 mg Subcutaneous Q24H  . [START ON 11/09/2013] PHENobarbital  32.4 mg Oral Q1200  . PHENobarbital  64.8 mg Oral QPM  . polyethylene glycol  17 g Oral Daily  . tamsulosin  0.4 mg Oral Daily   Continuous Infusions:   Time spent: 25 minutes  Zarriah Starkel L  [facility] Pager [phone]. If 7PM-7AM, please contact night-coverage at [email or web address], password [facility] 11/08/2013, 1:23 PM  LOS: 2 days

## 2013-11-08 NOTE — Progress Notes (Signed)
Subjective: His sister says he's out of sorts, but doesn't seem to be uncomfortable like he was before admission.   Objective: Vital signs in last 24 hours: Temp:  [98.2 F (36.8 C)-98.4 F (36.9 C)] 98.4 F (36.9 C) (08/11 0630) Pulse Rate:  [69-78] 69 (08/11 0630) Resp:  [17-18] 18 (08/11 0630) BP: (149-154)/(88-93) 149/88 mmHg (08/11 0630) SpO2:  [97 %-99 %] 99 % (08/11 0630) Last BM Date: 11/07/13 2 BM's recorded yesterday. Afebrile, BP up some Creatinine is 1.7 Still NPO He had contrast in colon on film yesterday.  Intake/Output from previous day: 08/10 0701 - 08/11 0700 In: 2387.1 [I.V.:2387.1] Out: 3200 [Urine:3200] Intake/Output this shift: Total I/O In: 0  Out: 350 [Urine:350]  General appearance: alert and no distress GI: soft, not distended, he didn't want me to touch his stomach, but didn't seem tender.  + BS.  Lab Results:   Recent Labs  11/06/13 2321 11/07/13 0535  WBC 5.3 4.9  HGB 13.8 13.5  HCT 39.4 39.8  PLT 248 225    BMET  Recent Labs  11/06/13 2321 11/07/13 0535  NA 137 137  K 4.8 4.7  CL 97 99  CO2 27 26  GLUCOSE 97 97  BUN 12 12  CREATININE 1.75* 1.70*  CALCIUM 9.7 9.4   PT/INR  Recent Labs  11/07/13 0535  LABPROT 13.8  INR 1.06     Recent Labs Lab 11/07/13 0535  AST 20  ALT 12  ALKPHOS 103  BILITOT 0.2*  PROT 6.8  ALBUMIN 4.1     Lipase     Component Value Date/Time   LIPASE 75* 11/06/2013 2321     Studies/Results: Ct Abdomen Pelvis W Contrast  11/07/2013   CLINICAL DATA:  Pain, decreased swelling, decreased appetite. Elevated lipase.  EXAM: CT ABDOMEN AND PELVIS WITH CONTRAST  TECHNIQUE: Multidetector CT imaging of the abdomen and pelvis was performed using the standard protocol following bolus administration of intravenous contrast.  CONTRAST:  80mL OMNIPAQUE IOHEXOL 300 MG/ML  SOLN  COMPARISON:  Prior CT from 02/24/2013  FINDINGS: Mild interstitial changes noted at the visualized lung bases.   Subcapsular wedge-shaped hypodensity within the hepatic dome is stable from prior. A few additional scattered subcentimeter hypodensities noted within the liver, too small the characterize. Gallbladder within normal limits. No biliary dilatation. The spleen and adrenal glands demonstrate a normal contrast enhanced appearance.  The pancreas is within normal limits. No significant peripancreatic inflammatory changes seen to suggest acute pancreatitis. No pancreatic ductal dilatation. Fairly homogeneous enhancement seen throughout the pancreatic parenchyma.  The right kidney is low-lying within the abdomen and externally rotated. Kidneys are otherwise unremarkable without evidence of nephrolithiasis, hydronephrosis, or focal enhancing renal mass.  Small hiatal hernia noted. Stomach otherwise unremarkable. Several mildly prominent contrast filled loops of small bowel are seen within the anterior and left abdomen, measuring up to 4.3 cm in diameter. The ileum is decompressed distally. There is question of a focal transition point within the right lower quadrant. Large amount of retained stool present throughout the colon. The colon itself is of normal caliber.  Bladder well distended but otherwise unremarkable. Enlarged prostate again noted.  No free air or fluid. No pathologically enlarged intra-abdominal pelvic lymph nodes. Right inguinal hernia containing intermediate fluid density is noted. Normal intravascular enhancement seen within the abdomen and pelvis.  No acute osseous abnormality. No worrisome lytic or blastic osseous lesions.  IMPRESSION: 1. Scattered mildly dilated loops of small bowel with associated air-fluid levels within the upper  mid and left abdomen with decompressed ileum distally there is question of focal transition point in the right lower quadrant. Findings are suggestive of possible early or partial small bowel obstruction. Clinical correlation recommended. 2. Large amount of retained stool  diffusely throughout the colon, suggesting constipation. 3. No other acute intra-abdominal or pelvic process.   Electronically Signed   By: Rise Mu M.D.   On: 11/07/2013 01:45   Dg Abd Acute W/chest  11/06/2013   CLINICAL DATA:  Abdominal pain.  EXAM: ACUTE ABDOMEN SERIES (ABDOMEN 2 VIEW & CHEST 1 VIEW)  COMPARISON:  Abdominal radiograph from 03/18/2013  FINDINGS: The lungs are well-aerated. Minimal bibasilar airspace opacities likely reflect atelectasis. There is no evidence of focal pleural effusion or pneumothorax. The cardiomediastinal silhouette is enlarged.  Diffusely air-filled loops of small and large bowel are seen, with distention of the colon to the 8.5 cm. This likely reflects some degree of ileus. There is no definite evidence of distal obstruction. No free intra-abdominal air is identified on the provided upright view.  No acute osseous abnormalities are seen; the sacroiliac joints are unremarkable in appearance. Minimal right convex thoracic scoliosis is noted.  IMPRESSION: 1. Diffuse distention of small and large bowel loops with air, likely reflecting some degree of ileus. No definite evidence of bowel obstruction. No free intra-abdominal air seen. 2. Minimal bibasilar airspace opacities likely reflect atelectasis; cardiomegaly noted.   Electronically Signed   By: Roanna Raider M.D.   On: 11/06/2013 22:22   Dg Abd Portable 1v  11/07/2013   CLINICAL DATA:  Follow-up small bowel obstruction.  EXAM: PORTABLE ABDOMEN - 1 VIEW  COMPARISON:  CT 11/07/2013  FINDINGS: Supine images of the abdomen were obtained. Oral contrast has advanced into the colon. There continues to be gas filled loops of bowel, particularly in the left abdomen. Limited evaluation for free air on these supine images. The urinary bladder is markedly distended and contains contrast.  IMPRESSION: Oral contrast has advanced into the colon. No evidence for a high-grade obstruction. The bowel gas pattern is nonspecific.   Massive distension of the urinary bladder.   Electronically Signed   By: Richarda Overlie M.D.   On: 11/07/2013 09:13    Medications: . enoxaparin (LOVENOX) injection  40 mg Subcutaneous Q24H  . pantoprazole (PROTONIX) IV  40 mg Intravenous Q24H  . PHENObarbital  32.5 mg Intravenous Q1200  . PHENObarbital  65 mg Intravenous QHS  . tamsulosin  0.4 mg Oral Daily    Assessment/Plan 1. SBO vs constipation with hx of SBO/constipation and prior bowel surgery.  2. Autism spectrum disorder/Mental retardation  3. Mild renal insuffiencey  4. Hx of thyroid condition  5. Dyslipidemia   Plan:  Clear liquids and see how he does. Ambulate, he seems to be voiding well and no issues there.  No uti so far, urine culture pending.   LOS: 2 days    JENNINGS,WILLARD 11/08/2013  Agree with above.  Ovidio Kin, MD, [facility] Pager: [phone] Office phone:  [phone]

## 2013-11-09 MED ORDER — POLYETHYLENE GLYCOL 3350 17 G PO PACK
17.0000 g | PACK | Freq: Two times a day (BID) | ORAL | Status: DC
  Administered 2013-11-09 – 2013-11-10 (×2): 17 g via ORAL
  Filled 2013-11-09 (×3): qty 1

## 2013-11-09 MED ORDER — DOCUSATE SODIUM 100 MG PO CAPS
200.0000 mg | ORAL_CAPSULE | Freq: Two times a day (BID) | ORAL | Status: DC
  Administered 2013-11-09 – 2013-11-10 (×3): 200 mg via ORAL
  Filled 2013-11-09 (×4): qty 2

## 2013-11-09 MED ORDER — PSYLLIUM 95 % PO PACK
1.0000 | PACK | Freq: Every day | ORAL | Status: DC
  Administered 2013-11-09 – 2013-11-10 (×2): 1 via ORAL
  Filled 2013-11-09 (×2): qty 1

## 2013-11-09 MED ORDER — BISACODYL 10 MG RE SUPP
10.0000 mg | Freq: Every day | RECTAL | Status: DC
  Administered 2013-11-09: 10 mg via RECTAL
  Filled 2013-11-09 (×2): qty 1

## 2013-11-09 NOTE — Progress Notes (Signed)
[facility] PROGRESS NOTE  Richard [name] [id number] DOB: February 06, 1956 DOA: 11/06/2013 PCP: Nani GasserMETHENEY,CATHERINE, MD    HPI/Subjective:  Poor historian due to underlying mental retardation & autism, nonverbal at baseline, sister-in-law bedside. Patient appears to be in no distress. Unable to answer questions.    Assessment/Plan:  SBO (small bowel obstruction): PROBABLE PARTIAL VS EARLY v constipation: resolving. Clinically improved, in no discomfort, general surgery following diet advanced to full liquid, had a BM 2 days ago, placed on bowel regimen and will follow. Repeat abdominal x-ray shows no signs of bowel obstruction and is without any air fluid levels. Exam is benign as well. Place on bowel regimen which he takes at home.    Unspecified hyperlipidemia - on bowel obstruction completely resolved we'll place on home dose statin which is Zocor 40 mg daily.   History of Autism, Mental retardation along with underlying seizures. On phenobarbital which will be continued. Continue supportive care.      CKD (chronic kidney disease) stage 3, GFR 30-59 ml/min - reacting at baseline of around 1.8. No acute issues.     Urine retention likely related to above. Voiding fine. PVR 200 on 11/08/2013. Continue flomax. UA negative, will repeat a bladder scan on 11/09/13.     Code Status:  full Family Communication:  Sister in Social workerlaw at bedside Disposition Plan:  home    Consultants:  CCS  Procedures:     Antibiotics:        Objective: Filed Vitals:   11/09/13 0600  BP: 140/83  Pulse: 60  Temp: 98.1 F (36.7 C)  Resp: 17    Intake/Output Summary (Last 24 hours) at 11/09/13 1255 Last data filed at 11/09/13 1151  Gross per 24 hour  Intake   2340 ml  Output   4250 ml  Net  -1910 ml   Filed Weights   11/06/13 2055 11/07/13 0351  Weight: 77.111 kg (170 lb) 82.645 kg (182 lb 3.2 oz)    Exam:   General:  Asleep. Arousable.  comfortable  Cardiovascular: RRR without MGR  Respiratory: CTA without WRR  Abdomen: S, NT, ND. Bowel sounds present  Ext: no CCE  Basic Metabolic Panel:  Recent Labs Lab 11/06/13 2321 11/07/13 0535  NA 137 137  K 4.8 4.7  CL 97 99  CO2 27 26  GLUCOSE 97 97  BUN 12 12  CREATININE 1.75* 1.70*  CALCIUM 9.7 9.4  MG  --  2.2  PHOS  --  3.0   Liver Function Tests:  Recent Labs Lab 11/07/13 0535  AST 20  ALT 12  ALKPHOS 103  BILITOT 0.2*  PROT 6.8  ALBUMIN 4.1    Recent Labs Lab 11/06/13 2321  LIPASE 75*   No results found for this basename: AMMONIA,  in the last 168 hours CBC:  Recent Labs Lab 11/06/13 2321 11/07/13 0535  WBC 5.3 4.9  NEUTROABS 2.9  --   HGB 13.8 13.5  HCT 39.4 39.8  MCV 91.2 92.1  PLT 248 225   Cardiac Enzymes: No results found for this basename: CKTOTAL, CKMB, CKMBINDEX, TROPONINI,  in the last 168 hours BNP (last 3 results) No results found for this basename: PROBNP,  in the last 8760 hours CBG: No results found for this basename: GLUCAP,  in the last 168 hours  Recent Results (from the past 240 hour(s))  URINE CULTURE     Status: None   Collection Time    11/07/13  1:56 PM      Result Value  Ref Range Status   Specimen Description URINE, RANDOM   Final   Special Requests NONE   Final   Culture  Setup Time     Final   Value: 11/07/2013 17:31     Performed at [facility] Count     Final   Value: NO GROWTH     Performed at [facility]   Culture     Final   Value: NO GROWTH     Performed at [facility]   Report Status 11/08/2013 FINAL   Final     Studies: No results found.  Scheduled Meds: . bisacodyl  10 mg Rectal Daily  . docusate sodium  200 mg Oral BID  . enoxaparin (LOVENOX) injection  40 mg Subcutaneous Q24H  . PHENobarbital  32.4 mg Oral Q1200  . PHENobarbital  64.8 mg Oral QPM  . polyethylene glycol  17 g Oral BID  . psyllium  1 packet Oral Daily  . tamsulosin  0.4 mg  Oral Daily   Continuous Infusions:   Time spent: 25 minutes  [facility]  [facility] Pager [phone]. If 7PM-7AM, please contact night-coverage at [email or web address], password [facility]  11/09/2013, 12:55 PM  LOS: 3 days

## 2013-11-09 NOTE — Progress Notes (Signed)
  Subjective: He is drinking allot of fluid, he's had 4 cups of water this AM.  No nausea, No BM.  He is on Miralax and stool softeners chronically at home.  The constipation is a long standing issue.  Objective: Vital signs in last 24 hours: Temp:  [98.1 F (36.7 C)-98.4 F (36.9 C)] 98.1 F (36.7 C) (08/12 0600) Pulse Rate:  [60-75] 60 (08/12 0600) Resp:  [17-18] 17 (08/12 0600) BP: (127-140)/(72-83) 140/83 mmHg (08/12 0600) SpO2:  [95 %-98 %] 98 % (08/12 0600) Last BM Date: 11/07/13 2640 PO recorded yesterday No BM recorded 3700 urine output recorded. Afebrile, VSS No labs Intake/Output from previous day: 08/11 0701 - 08/12 0700 In: 2640 [P.O.:2640] Out: 3700 [Urine:3700] Intake/Output this shift: Total I/O In: 120 [P.O.:120] Out: 400 [Urine:400]  General appearance: alert, cooperative and no distress GI: soft, non-tender; bowel sounds normal; no masses,  no organomegaly  Lab Results:   Recent Labs  11/06/13 2321 11/07/13 0535  WBC 5.3 4.9  HGB 13.8 13.5  HCT 39.4 39.8  PLT 248 225    BMET  Recent Labs  11/06/13 2321 11/07/13 0535  NA 137 137  K 4.8 4.7  CL 97 99  CO2 27 26  GLUCOSE 97 97  BUN 12 12  CREATININE 1.75* 1.70*  CALCIUM 9.7 9.4   PT/INR  Recent Labs  11/07/13 0535  LABPROT 13.8  INR 1.06     Recent Labs Lab 11/07/13 0535  AST 20  ALT 12  ALKPHOS 103  BILITOT 0.2*  PROT 6.8  ALBUMIN 4.1     Lipase     Component Value Date/Time   LIPASE 75* 11/06/2013 2321     Studies/Results: No results found.  Medications: . enoxaparin (LOVENOX) injection  40 mg Subcutaneous Q24H  . PHENobarbital  32.4 mg Oral Q1200  . PHENobarbital  64.8 mg Oral QPM  . polyethylene glycol  17 g Oral Daily  . tamsulosin  0.4 mg Oral Daily    Assessment/Plan 1. SBO vs constipation with hx of SBO/constipation and prior bowel surgery.  2. Autism spectrum disorder/Mental retardation  3. Mild renal insuffiencey  4. Hx of thyroid condition   5. Dyslipidemia  Plan:  I will increase his diet to full liquids, he got started on Miralax yesterday and I will add some fiber.     LOS: 3 days    JENNINGS,WILLARD 11/09/2013  Agree with above. He had another BM.  I would expect that he will be discharged tomorrow.  Richard [name] Eloise Picone, MD, [facility] Pager: [phone] Office phone:  [phone]

## 2013-11-10 MED ORDER — DOCUSATE SODIUM 100 MG PO CAPS: 100.0000 mg | ORAL_CAPSULE | Freq: Two times a day (BID) | ORAL | Status: DC

## 2013-11-10 MED ORDER — POLYETHYLENE GLYCOL 3350 17 G PO PACK: 17.0000 g | PACK | ORAL | Status: DC

## 2013-11-10 MED ORDER — TAMSULOSIN HCL 0.4 MG PO CAPS: 0.4000 mg | ORAL_CAPSULE | Freq: Every day | ORAL | Status: DC

## 2013-11-10 NOTE — Progress Notes (Signed)
Patient ID: Richard [name], male   DOB: 1955-04-11, 58 y.o.   MRN: [id number]    Subjective: Patient is nonverbal. The nurse reports bowel movements and drinking well.  Objective: Vital signs in last 24 hours: Temp:  [97.3 F (36.3 C)] 97.3 F (36.3 C) (08/12 1307) Pulse Rate:  [65] 65 (08/12 1307) Resp:  [16] 16 (08/12 1307) BP: (111)/(69) 111/69 mmHg (08/12 1307) SpO2:  [99 %] 99 % (08/12 1307) Last BM Date: 11/09/13  Intake/Output from previous day: 08/12 0701 - 08/13 0700 In: 1620 [P.O.:1620] Out: 3100 [Urine:3100] Intake/Output this shift:    PE: Abd: Soft, nontender, nondistended  Lab Results:  No results found for this basename: WBC, HGB, HCT, PLT,  in the last 72 hours BMET No results found for this basename: NA, K, CL, CO2, GLUCOSE, BUN, CREATININE, CALCIUM,  in the last 72 hours PT/INR No results found for this basename: LABPROT, INR,  in the last 72 hours CMP     Component Value Date/Time   NA 137 11/07/2013 0535   K 4.7 11/07/2013 0535   CL 99 11/07/2013 0535   CO2 26 11/07/2013 0535   GLUCOSE 97 11/07/2013 0535   BUN 12 11/07/2013 0535   CREATININE 1.70* 11/07/2013 0535   CALCIUM 9.4 11/07/2013 0535   PROT 6.8 11/07/2013 0535   ALBUMIN 4.1 11/07/2013 0535   AST 20 11/07/2013 0535   ALT 12 11/07/2013 0535   ALKPHOS 103 11/07/2013 0535   BILITOT 0.2* 11/07/2013 0535   GFRNONAA 43* 11/07/2013 0535   GFRAA 50* 11/07/2013 0535   Lipase     Component Value Date/Time   LIPASE 75* 11/06/2013 2321       Studies/Results: No results found.  Anti-infectives: Anti-infectives   None       Assessment/Plan  1. Autism 2. Small bowel obstruction, resolved  Plan: 1. We can advance the patient's diet to a low fiber diet. If he tolerates this today, he is stable for discharge home from our standpoint.   LOS: 4 days    OSBORNE,KELLY E 11/10/2013, 7:59 AM Pager: [phone]  Agree with above.  Ovidio Kinavid Erian Lariviere, MD, [facility] Pager:  [phone] Office phone:  [phone]

## 2013-11-10 NOTE — Discharge Instructions (Signed)
Follow with Primary MD METHENEY,CATHERINE, MD in 7 days   Get CBC, CMP, 2 view Chest X ray checked  by Primary MD next visit.    Activity: As tolerated with Full fall precautions use walker/cane & assistance as needed   Disposition Home     Diet: Heart Healthy soft diet with feeding assistance aspiration precautions.  For Heart failure patients - Check your Weight same time everyday, if you gain over 2 pounds, or you develop in leg swelling, experience more shortness of breath or chest pain, call your Primary MD immediately. Follow Cardiac Low Salt Diet and 1.8 lit/day fluid restriction.   On your next visit with her primary care physician please Get Medicines reviewed and adjusted.  Please request your Prim.MD to go over all Hospital Tests and Procedure/Radiological results at the follow up, please get all Hospital records sent to your Prim MD by signing hospital release before you go home.   If you experience worsening of your admission symptoms, develop shortness of breath, life threatening emergency, suicidal or homicidal thoughts you must seek medical attention immediately by calling 911 or calling your MD immediately  if symptoms less severe.  You Must read complete instructions/literature along with all the possible adverse reactions/side effects for all the Medicines you take and that have been prescribed to you. Take any new Medicines after you have completely understood and accpet all the possible adverse reactions/side effects.   Do not drive, operating heavy machinery, perform activities at heights, swimming or participation in water activities or provide baby sitting services if your were admitted for syncope or siezures until you have seen by Primary MD or a Neurologist and advised to do so again.  Do not drive when taking Pain medications.    Do not take more than prescribed Pain, Sleep and Anxiety Medications  Special Instructions: If you have smoked or chewed Tobacco   in the last 2 yrs please stop smoking, stop any regular Alcohol  and or any Recreational drug use.  Wear Seat belts while driving.   Please note  You were cared for by a hospitalist during your hospital stay. If you have any questions about your discharge medications or the care you received while you were in the hospital after you are discharged, you can call the unit and asked to speak with the hospitalist on call if the hospitalist that took care of you is not available. Once you are discharged, your primary care physician will handle any further medical issues. Please note that NO REFILLS for any discharge medications will be authorized once you are discharged, as it is imperative that you return to your primary care physician (or establish a relationship with a primary care physician if you do not have one) for your aftercare needs so that they can reassess your need for medications and monitor your lab values.

## 2013-11-10 NOTE — Care Management Note (Signed)
    Page 1 of 1   11/10/2013     11:46:44 AM CARE MANAGEMENT NOTE 11/10/2013  Patient:  Rise PaganiniRKER,Aarib   Account Number:  [id number]  Date Initiated:  11/10/2013  Documentation initiated by:  Lanier ClamMAHABIR,Keifer Habib  Subjective/Objective Assessment:   58 Y/O M ADMITTED W/SBO.     Action/Plan:   FROM HOME.   Anticipated DC Date:  11/10/2013   Anticipated DC Plan:  HOME/SELF CARE      DC Planning Services  CM consult      Choice offered to / List presented to:             Status of service:  Completed, signed off Medicare Important Message given?  NA - LOS <3 / Initial given by admissions (If response is "NO", the following Medicare IM given date fields will be blank) Date Medicare IM given:  11/10/2013 Medicare IM given by:  [facility]Rodd[facility] Sharron Date Additional Medicare IM given:   Additional Medicare IM given by:    Discharge Disposition:  HOME/SELF CARE  Per UR Regulation:  Reviewed for med. necessity/level of care/duration of stay  If discussed at Long Length of Stay Meetings, dates discussed:    Comments:  11/10/13 Satara Virella RN,BSN NCM [phone] D/C HOME NO NEEDS OR ORDERS.

## 2013-11-10 NOTE — Discharge Summary (Signed)
Richard [name], is a 58 y.o. male  DOB [birth year]  MRN [id number].  Admission date:  11/06/2013  Admitting Physician  Rodolph Bong, MD  Discharge Date:  11/10/2013   Primary MD  METHENEY,CATHERINE, MD  Recommendations for primary care physician for things to follow:   Monitor bowel movements and monitor clinically.   Admission Diagnosis  Anorexia [783.0] SBO (small bowel obstruction) [560.9] Generalized abdominal pain [789.07] Decreased stooling [787.99]   Discharge Diagnosis  Anorexia [783.0] SBO (small bowel obstruction) [560.9] Generalized abdominal pain [789.07] Decreased stooling [787.99]     Principal Problem:   SBO (small bowel obstruction): PROBABLE PARTIAL VS EARLY Active Problems:   Other and unspecified hyperlipidemia   History of seizures   Autism disorder   MR (mental retardation)   Unspecified constipation   CKD (chronic kidney disease) stage 3, GFR 30-59 ml/min   Urine retention      Past Medical History  Diagnosis Date  . Autism spectrum disorder   . Hypercholesteremia   . Bowel obstruction   . Broken leg Left  . Osteoporosis   . Mental retardation   . Hyperlipidemia   . Malnutrition   . Thyroid disease   . CKD (chronic kidney disease) stage 3, GFR 30-59 ml/min 11/07/2013    Past Surgical History  Procedure Laterality Date  . Abdominal surgery    . Leg surgery    . Eye surgery Left 03-14-2013    catatract  . Appendectomy         History of present illness and  Hospital Course:     Kindly see H&P for history of present illness and admission details, please review complete Labs, Consult reports and Test reports for all details in brief  HPI  from the history and physical done on the day of admission    Hospital Course   SBO (small bowel obstruction): PROBABLE PARTIAL VS  EARLY v constipation: resolving. Clinically improved, in no discomfort, general surgery following diet advanced to full liquid, has had 3 bowel movements since yesterday, completely symptom free and tolerating diet, seen by general surgery cleared for discharge. We'll have him continue his home bowel regimen and follow with PCP.      Unspecified hyperlipidemia - on bowel obstruction completely resolved we'll place on home dose statin which is Zocor 40 mg daily.     History of Autism, Mental retardation along with underlying seizures. On phenobarbital which will be continued. Continue supportive care.     CKD (chronic kidney disease) stage 3, GFR 30-59 ml/min - reacting at baseline of around 1.8. No acute issues.     Urine retention likely related to above. Voiding fine and retention much improved. Continue flomax. UA negative, post void bladder scan continues to be around 200cc or under.       Discharge Condition: Stable   Follow UP  Follow-up Information   Follow up with METHENEY,CATHERINE, MD. Schedule an appointment as soon as possible for a visit in 1 week.   Specialty:  Family Medicine  Contact information:   [address] [place] [address] [phone]         Discharge Instructions  and  Discharge Medications          Discharge Instructions   Discharge instructions    Complete by:  As directed   Follow with Primary MD METHENEY,CATHERINE, MD in 7 days   Get CBC, CMP, 2 view Chest X ray checked  by Primary MD next visit.    Activity: As tolerated with Full fall precautions use walker/cane & assistance as needed   Disposition Home     Diet: Heart Healthy soft diet with feeding assistance aspiration precautions.  For Heart failure patients - Check your Weight same time everyday, if you gain over 2 pounds, or you develop in leg swelling, experience more shortness of breath or chest pain, call your Primary MD immediately. Follow  Cardiac Low Salt Diet and 1.8 lit/day fluid restriction.   On your next visit with her primary care physician please Get Medicines reviewed and adjusted.  Please request your Prim.MD to go over all Hospital Tests and Procedure/Radiological results at the follow up, please get all Hospital records sent to your Prim MD by signing hospital release before you go home.   If you experience worsening of your admission symptoms, develop shortness of breath, life threatening emergency, suicidal or homicidal thoughts you must seek medical attention immediately by calling 911 or calling your MD immediately  if symptoms less severe.  You Must read complete instructions/literature along with all the possible adverse reactions/side effects for all the Medicines you take and that have been prescribed to you. Take any new Medicines after you have completely understood and accpet all the possible adverse reactions/side effects.   Do not drive, operating heavy machinery, perform activities at heights, swimming or participation in water activities or provide baby sitting services if your were admitted for syncope or siezures until you have seen by Primary MD or a Neurologist and advised to do so again.  Do not drive when taking Pain medications.    Do not take more than prescribed Pain, Sleep and Anxiety Medications  Special Instructions: If you have smoked or chewed Tobacco  in the last 2 yrs please stop smoking, stop any regular Alcohol  and or any Recreational drug use.  Wear Seat belts while driving.   Please note  You were cared for by a hospitalist during your hospital stay. If you have any questions about your discharge medications or the care you received while you were in the hospital after you are discharged, you can call the unit and asked to speak with the hospitalist on call if the hospitalist that took care of you is not available. Once you are discharged, your primary care physician will handle  any further medical issues. Please note that NO REFILLS for any discharge medications will be authorized once you are discharged, as it is imperative that you return to your primary care physician (or establish a relationship with a primary care physician if you do not have one) for your aftercare needs so that they can reassess your need for medications and monitor your lab values.     Increase activity slowly    Complete by:  As directed             Medication List         calcium-vitamin D 500-200 MG-UNIT per tablet  Commonly known as:  OSCAL WITH D  Take 1  tablet by mouth 2 (two) times daily.     cholecalciferol 1000 UNITS tablet  Commonly known as:  VITAMIN D  Take 2,000 Units by mouth daily.     docusate sodium 100 MG capsule  Commonly known as:  COLACE  Take 1 capsule (100 mg total) by mouth 2 (two) times daily. Patient is given this medication to help with constipation.     magnesium hydroxide 400 MG/5ML suspension  Commonly known as:  MILK OF MAGNESIA  Take 45 mLs by mouth daily as needed for mild constipation.     multivitamin with minerals tablet  Take 1 tablet by mouth daily.     PHENobarbital 32.4 MG tablet  Commonly known as:  LUMINAL  Take 32.4 mg by mouth daily at 12 noon.     PHENobarbital 64.8 MG tablet  Commonly known as:  LUMINAL  Take 64.8 mg by mouth every evening.     polyethylene glycol packet  Commonly known as:  MIRALAX / GLYCOLAX  Take 17 g by mouth every other day.     simvastatin 40 MG tablet  Commonly known as:  ZOCOR  Take 40 mg by mouth every evening.     tamsulosin 0.4 MG Caps capsule  Commonly known as:  FLOMAX  Take 1 capsule (0.4 mg total) by mouth daily.          Diet and Activity recommendation: See Discharge Instructions above   Consults obtained - Surgery   Major procedures and Radiology Reports - PLEASE review detailed and final reports for all details, in brief -    Ct Abdomen Pelvis W Contrast  11/07/2013    CLINICAL DATA:  Pain, decreased swelling, decreased appetite. Elevated lipase.  EXAM: CT ABDOMEN AND PELVIS WITH CONTRAST  TECHNIQUE: Multidetector CT imaging of the abdomen and pelvis was performed using the standard protocol following bolus administration of intravenous contrast.  CONTRAST:  80mL OMNIPAQUE IOHEXOL 300 MG/ML  SOLN  COMPARISON:  Prior CT from 02/24/2013  FINDINGS: Mild interstitial changes noted at the visualized lung bases.  Subcapsular wedge-shaped hypodensity within the hepatic dome is stable from prior. A few additional scattered subcentimeter hypodensities noted within the liver, too small the characterize. Gallbladder within normal limits. No biliary dilatation. The spleen and adrenal glands demonstrate a normal contrast enhanced appearance.  The pancreas is within normal limits. No significant peripancreatic inflammatory changes seen to suggest acute pancreatitis. No pancreatic ductal dilatation. Fairly homogeneous enhancement seen throughout the pancreatic parenchyma.  The right kidney is low-lying within the abdomen and externally rotated. Kidneys are otherwise unremarkable without evidence of nephrolithiasis, hydronephrosis, or focal enhancing renal mass.  Small hiatal hernia noted. Stomach otherwise unremarkable. Several mildly prominent contrast filled loops of small bowel are seen within the anterior and left abdomen, measuring up to 4.3 cm in diameter. The ileum is decompressed distally. There is question of a focal transition point within the right lower quadrant. Large amount of retained stool present throughout the colon. The colon itself is of normal caliber.  Bladder well distended but otherwise unremarkable. Enlarged prostate again noted.  No free air or fluid. No pathologically enlarged intra-abdominal pelvic lymph nodes. Right inguinal hernia containing intermediate fluid density is noted. Normal intravascular enhancement seen within the abdomen and pelvis.  No acute osseous  abnormality. No worrisome lytic or blastic osseous lesions.  IMPRESSION: 1. Scattered mildly dilated loops of small bowel with associated air-fluid levels within the upper mid and left abdomen with decompressed ileum distally there is question of  focal transition point in the right lower quadrant. Findings are suggestive of possible early or partial small bowel obstruction. Clinical correlation recommended. 2. Large amount of retained stool diffusely throughout the colon, suggesting constipation. 3. No other acute intra-abdominal or pelvic process.   Electronically Signed   By: Richard Mu M.D.   On: 11/07/2013 01:45    Dg Abd Acute W/chest  11/06/2013   CLINICAL DATA:  Abdominal pain.  EXAM: ACUTE ABDOMEN SERIES (ABDOMEN 2 VIEW & CHEST 1 VIEW)  COMPARISON:  Abdominal radiograph from 03/18/2013  FINDINGS: The lungs are well-aerated. Minimal bibasilar airspace opacities likely reflect atelectasis. There is no evidence of focal pleural effusion or pneumothorax. The cardiomediastinal silhouette is enlarged.  Diffusely air-filled loops of small and large bowel are seen, with distention of the colon to the 8.5 cm. This likely reflects some degree of ileus. There is no definite evidence of distal obstruction. No free intra-abdominal air is identified on the provided upright view.  No acute osseous abnormalities are seen; the sacroiliac joints are unremarkable in appearance. Minimal right convex thoracic scoliosis is noted.  IMPRESSION: 1. Diffuse distention of small and large bowel loops with air, likely reflecting some degree of ileus. No definite evidence of bowel obstruction. No free intra-abdominal air seen. 2. Minimal bibasilar airspace opacities likely reflect atelectasis; cardiomegaly noted.   Electronically Signed   By: Roanna Raider M.D.   On: 11/06/2013 22:22    Dg Abd Portable 1v  11/07/2013   CLINICAL DATA:  Follow-up small bowel obstruction.  EXAM: PORTABLE ABDOMEN - 1 VIEW  COMPARISON:  CT  11/07/2013  FINDINGS: Supine images of the abdomen were obtained. Oral contrast has advanced into the colon. There continues to be gas filled loops of bowel, particularly in the left abdomen. Limited evaluation for free air on these supine images. The urinary bladder is markedly distended and contains contrast.  IMPRESSION: Oral contrast has advanced into the colon. No evidence for a high-grade obstruction. The bowel gas pattern is nonspecific.  Massive distension of the urinary bladder.   Electronically Signed   By: Richarda Overlie M.D.   On: 11/07/2013 09:13    Micro Results      Recent Results (from the past 240 hour(s))  URINE CULTURE     Status: None   Collection Time    11/07/13  1:56 PM      Result Value Ref Range Status   Specimen Description URINE, RANDOM   Final   Special Requests NONE   Final   Culture  Setup Time     Final   Value: 11/07/2013 17:31     Performed at [facility] Count     Final   Value: NO GROWTH     Performed at [facility]   Culture     Final   Value: NO GROWTH     Performed at [facility]   Report Status 11/08/2013 FINAL   Final       Today   Subjective:   Richard [name] today has no headache,no chest abdominal pain,no new weakness tingling or numbness, feels much better wants to go home today.    Objective:   Blood pressure 111/69, pulse 65, temperature 97.3 F (36.3 C), temperature source Oral, resp. rate 16, height 6\' 1"  (1.854 m), weight 82.645 kg (182 lb 3.2 oz), SpO2 99.00%.   Intake/Output Summary (Last 24 hours) at 11/10/13 1045 Last data filed at 11/09/13 1727  Gross per 24  hour  Intake   1040 ml  Output   2500 ml  Net  -1460 ml    Exam Awake , No new F.N deficits, Normal affect  [place].AT,PERRAL Supple Neck,No JVD, No cervical lymphadenopathy appriciated.  Symmetrical Chest wall movement, Good air movement bilaterally, CTAB RRR,No Gallops,Rubs or new Murmurs, No Parasternal Heave +ve B.Sounds, Abd  Soft, Non tender, No organomegaly appriciated, No rebound -guarding or rigidity. No Cyanosis, Clubbing or edema, No new Rash or bruise  Data Review   CBC w Diff: Lab Results  Component Value Date   WBC 4.9 11/07/2013   HGB 13.5 11/07/2013   HCT 39.8 11/07/2013   PLT 225 11/07/2013   LYMPHOPCT 32 11/06/2013   MONOPCT 11 11/06/2013   EOSPCT 1 11/06/2013   BASOPCT 0 11/06/2013    CMP: Lab Results  Component Value Date   NA 137 11/07/2013   K 4.7 11/07/2013   CL 99 11/07/2013   CO2 26 11/07/2013   BUN 12 11/07/2013   CREATININE 1.70* 11/07/2013   PROT 6.8 11/07/2013   ALBUMIN 4.1 11/07/2013   BILITOT 0.2* 11/07/2013   ALKPHOS 103 11/07/2013   AST 20 11/07/2013   ALT 12 11/07/2013  .   Total Time in preparing paper work, data evaluation and todays exam - 35 minutes  Leroy Sea M.D on 11/10/2013 at 10:45 AM  [facility] Office  [phone]   **Disclaimer: This note may have been dictated with voice recognition software. Similar sounding words can inadvertently be transcribed and this note may contain transcription errors which may not have been corrected upon publication of note.**

## 2013-11-10 NOTE — Progress Notes (Signed)
PT Cancellation Note  Patient Details Name: Richard [name] MRN: [id number] DOB: March 02, 1956   Cancelled Treatment:    Reason Eval/Treat Not Completed: Other (comment) (Pt's sister in law stated pt is DC'ing home today and doesn't need PT, he's walking without assist. )   Tamala SerUhlenberg, Cabrini Ruggieri Kistler 11/10/2013, 10:39 AM [phone]

## 2014-04-20 ENCOUNTER — Encounter: Payer: Self-pay | Admitting: Family Medicine

## 2014-04-20 ENCOUNTER — Ambulatory Visit (INDEPENDENT_AMBULATORY_CARE_PROVIDER_SITE_OTHER): Payer: Medicare Other | Admitting: Family Medicine

## 2014-04-20 VITALS — BP 91/59 | HR 68 | Wt 174.0 lb

## 2014-04-20 DIAGNOSIS — G43911 Migraine, unspecified, intractable, with status migrainosus: Secondary | ICD-10-CM

## 2014-04-20 DIAGNOSIS — L820 Inflamed seborrheic keratosis: Secondary | ICD-10-CM

## 2014-04-20 MED ORDER — SUMATRIPTAN SUCCINATE 100 MG PO TABS: 100.0000 mg | ORAL_TABLET | Freq: Once | ORAL | Status: DC

## 2014-04-20 NOTE — Progress Notes (Signed)
   Subjective:    Patient ID: Richard [name], male    DOB: [birth year], 57 y.o.   MRN: [id number]  HPI Raelyn MoraMiagraine - sxs began last Thursday sister reports that he was in bed all day/ not eating, grimacing and pulling covers over his head. He excessivley sweat and drools more than usual.  No fever.  BP has been fine.  No appetite for today.  Not drinking as much as well. Using tylenol and excedrin migraine for recurrences. Unfortunately she says that it's not effective at all and hasn't ever been.Marland Kitchen. He does have a history of what they think her migraines. He's never been able to verbalize this but he has carried that diagnosis. She says typically when this happens it only last about 2-3 days at this time was unusual in that it lasted 6 days. That he is feeling much better today and his appetite is back to normal and he ate breakfast without any difficulty. His sister says that he gets these about once a month.  Lesion on face near left ear. Was more flat and tan and over last week it is more raised red and with scab.     He lives partime here with his sister [place] Systems     Objective:   Physical Exam  Constitutional: He is oriented to person, place, and time. He appears well-developed and well-nourished.  HENT:  Head: Normocephalic and atraumatic.  Right Ear: External ear normal.  Left Ear: External ear normal.  Nose: Nose normal.  Mouth/Throat: Oropharynx is clear and moist.  TMs and canals are clear.   Eyes: Conjunctivae and EOM are normal. Pupils are equal, round, and reactive to light.  Neck: Neck supple. No thyromegaly present.  Cardiovascular: Normal rate and normal heart sounds.   Pulmonary/Chest: Effort normal and breath sounds normal.  Lymphadenopathy:    He has no cervical adenopathy.  Neurological: He is alert and oriented to person, place, and time.  Skin: Skin is warm and dry.  Psychiatric: He has a normal mood and affect.    Small  seb keratosi on face near left ear that appears to be partly detaches.       Assessment & Plan:  Migraine Headache - he is unable to take NSAIDs because of chronic kidney disease stage III. The Tylenol and Excedrin Migraine are not working recommend a trial of a triptan. We'll start with Imitrex. I verified this does not interact with any of his other medications and is not processed through the kidneys. Explained on how to take the medication appropriately. She will need to let me know if she feels it's effective and how often it since to work.  inflammed seb keratosis- if not healing in next 2-3 weeks then rec shave biopsy or cryotherapy.

## 2014-05-02 DIAGNOSIS — H33021 Retinal detachment with multiple breaks, right eye: Secondary | ICD-10-CM | POA: Insufficient documentation

## 2014-05-02 DIAGNOSIS — H35411 Lattice degeneration of retina, right eye: Secondary | ICD-10-CM | POA: Insufficient documentation

## 2014-05-02 DIAGNOSIS — T884XXA Failed or difficult intubation, initial encounter: Secondary | ICD-10-CM | POA: Insufficient documentation

## 2014-08-25 DIAGNOSIS — R809 Proteinuria, unspecified: Secondary | ICD-10-CM | POA: Insufficient documentation

## 2015-03-14 ENCOUNTER — Encounter: Payer: Self-pay | Admitting: *Deleted

## 2015-03-14 ENCOUNTER — Emergency Department (INDEPENDENT_AMBULATORY_CARE_PROVIDER_SITE_OTHER)
Admission: EM | Admit: 2015-03-14 | Discharge: 2015-03-14 | Payer: Medicare Other | Source: Home / Self Care | Attending: Family Medicine | Admitting: Family Medicine

## 2015-03-14 DIAGNOSIS — R112 Nausea with vomiting, unspecified: Secondary | ICD-10-CM | POA: Diagnosis not present

## 2015-03-14 DIAGNOSIS — R0902 Hypoxemia: Secondary | ICD-10-CM | POA: Diagnosis not present

## 2015-03-14 HISTORY — DX: Serous retinal detachment, unspecified eye: H33.20

## 2015-03-14 NOTE — ED Provider Notes (Signed)
CSN: [id number]     Arrival date & time 03/14/15  1331 History   First MD Initiated Contact with Patient 03/14/15 1413     Chief Complaint  Patient presents with  . Emesis  . Diarrhea  . Cough      HPI Comments: Patient lives in [place], and arrived locally 3 days ago in order to undergo eye surgery at [facility] tomorrow. He has a history of autism and mental retardation with consequent difficulty communicating.  He also has a past history of bowel obstruction. His brother reports that patient had diarrhea upon arriving in [place] 3 days ago.  The next day he had nausea, and yesterday he began vomiting which has persisted.  This morning he developed hiccups.  The history is provided by a relative and a caregiver.    Past Medical History  Diagnosis Date  . Autism spectrum disorder   . Hypercholesteremia   . Bowel obstruction (HCC)   . Broken leg Left  . Osteoporosis   . Mental retardation   . Hyperlipidemia   . Malnutrition (HCC)   . Thyroid disease   . CKD (chronic kidney disease) stage 3, GFR 30-59 ml/min 11/07/2013  . Detached retina    Past Surgical History  Procedure Laterality Date  . Abdominal surgery    . Leg surgery    . Eye surgery Left 03-14-2013    catatract  . Appendectomy     Family History  Problem Relation Age of Onset  . Hypertension Other   . Diabetes Other   . Hyperlipidemia Other   . Scoliosis Other    Social History  Substance Use Topics  . Smoking status: Never Smoker   . Smokeless tobacco: Never Used  . Alcohol Use: No    Review of Systems  Constitutional: Positive for fever, activity change, appetite change and fatigue.  HENT: Negative.   Eyes: Negative.   Respiratory: Positive for shortness of breath.   Cardiovascular: Negative.   Gastrointestinal: Positive for vomiting, diarrhea and abdominal distention.  Genitourinary: Negative.   Musculoskeletal: Negative.     Allergies  Nsaids; Ofloxacin; and Phenothiazines  Home  Medications   Prior to Admission medications   Medication Sig Start Date End Date Taking? Authorizing Provider  cholecalciferol (VITAMIN D) 1000 UNITS tablet Take 2,000 Units by mouth daily.    Historical Provider, MD  docusate sodium (COLACE) 100 MG capsule Take 1 capsule (100 mg total) by mouth 2 (two) times daily. Patient is given this medication to help with constipation. 11/10/13   Leroy SeaPrashant K Singh, MD  lisinopril-hydrochlorothiazide (PRINZIDE,ZESTORETIC) 10-12.5 MG per tablet Take 1 tablet by mouth daily. Taking 1/2 tab 02/06/14   Historical Provider, MD  Multiple Vitamins-Minerals (MULTIVITAMIN WITH MINERALS) tablet Take 1 tablet by mouth daily.    Historical Provider, MD  PHENobarbital (LUMINAL) 32.4 MG tablet Take 32.4 mg by mouth daily at 12 noon.    Historical Provider, MD  PHENobarbital (LUMINAL) 64.8 MG tablet Take 64.8 mg by mouth every evening.    Historical Provider, MD  polyethylene glycol (MIRALAX / GLYCOLAX) packet Take 17 g by mouth every other day. 11/10/13   Leroy SeaPrashant K Singh, MD  simvastatin (ZOCOR) 40 MG tablet Take 40 mg by mouth every evening.    Historical Provider, MD  SUMAtriptan (IMITREX) 100 MG tablet Take 1 tablet (100 mg total) by mouth once. May repeat in 2 hours if headache persists or recurs. 04/20/14   Agapito Gamesatherine D Metheney, MD  tamsulosin (FLOMAX) 0.4 MG CAPS  capsule Take 1 capsule (0.4 mg total) by mouth daily. 11/10/13   Leroy Sea, MD   Meds Ordered and Administered this Visit  Medications - No data to display  BP 102/56 mmHg  Pulse 97  Temp(Src) 99.1 F (37.3 C) (Tympanic)  Resp 18  Wt 188 lb (85.276 kg)  SpO2 91% No data found.   Physical Exam Nursing notes and Vital Signs reviewed. Appearance:  Patient appears stated age.  He is non-communicative and generally cooperative, and appears uncomfortable although he is in no acute distress Eyes:  Pupils are equal, round, and reactive to light and accomodation.  Extraocular movement is intact.   Conjunctivae are not inflamed  Nose:   Congested turbinates.   Mouth/Pharynx:  Dry mucous membranes Neck:  Supple.  No adenopathy Lungs:  Decreased breath sounds. Heart:  Regular rate and rhythm without murmurs, rubs, or gallops.  Abdomen:  Distended with absent bowel sounds.  Guarding present.    Extremities:  No edema.  No calf tenderness Skin:  No rash present.   ED Course  Procedures  None    MDM   1. Non-intractable vomiting with nausea, unspecified vomiting type   2. Hypoxia    Suspect recurrent bowel obstruction, and likely aspiration pneumonia. Advised family to take patient to [facility] ER immediately for further evaluation and treatment.    Lattie Haw, MD 03/17/15 [phone]

## 2015-03-14 NOTE — ED Notes (Signed)
Pt's brother reports that he has had vomiting, diarrhea, decreased appetite and hiccups x last night. He reports a cough for atleast 3 days, since he picked him up in [place]. He is sch'ed for eye surgery tomorrow at [facility]. He has not taken his 1200 meds today.

## 2015-03-14 NOTE — Discharge Instructions (Signed)

## 2015-03-19 ENCOUNTER — Telehealth: Payer: Self-pay | Admitting: Emergency Medicine

## 2015-03-27 ENCOUNTER — Inpatient Hospital Stay (HOSPITAL_COMMUNITY)
Admission: EM | Admit: 2015-03-27 | Discharge: 2015-03-31 | DRG: 178 | Disposition: A | Discharge status: Home / Self Care | Payer: Medicare Other | Attending: Internal Medicine | Admitting: Internal Medicine

## 2015-03-27 ENCOUNTER — Ambulatory Visit (INDEPENDENT_AMBULATORY_CARE_PROVIDER_SITE_OTHER): Payer: Medicare Other | Admitting: Physician Assistant

## 2015-03-27 ENCOUNTER — Ambulatory Visit (INDEPENDENT_AMBULATORY_CARE_PROVIDER_SITE_OTHER): Payer: Medicare Other

## 2015-03-27 ENCOUNTER — Encounter: Payer: Self-pay | Admitting: Physician Assistant

## 2015-03-27 ENCOUNTER — Encounter (HOSPITAL_COMMUNITY): Payer: Self-pay | Admitting: Emergency Medicine

## 2015-03-27 VITALS — BP 107/69 | HR 110 | Ht 73.0 in | Wt 184.0 lb

## 2015-03-27 DIAGNOSIS — E860 Dehydration: Secondary | ICD-10-CM | POA: Diagnosis present

## 2015-03-27 DIAGNOSIS — K567 Ileus, unspecified: Secondary | ICD-10-CM | POA: Diagnosis present

## 2015-03-27 DIAGNOSIS — Z79899 Other long term (current) drug therapy: Secondary | ICD-10-CM

## 2015-03-27 DIAGNOSIS — N183 Chronic kidney disease, stage 3 unspecified: Secondary | ICD-10-CM

## 2015-03-27 DIAGNOSIS — A0819 Acute gastroenteropathy due to other small round viruses: Secondary | ICD-10-CM | POA: Diagnosis present

## 2015-03-27 DIAGNOSIS — J9811 Atelectasis: Secondary | ICD-10-CM | POA: Diagnosis not present

## 2015-03-27 DIAGNOSIS — M810 Age-related osteoporosis without current pathological fracture: Secondary | ICD-10-CM | POA: Diagnosis present

## 2015-03-27 DIAGNOSIS — R059 Cough, unspecified: Secondary | ICD-10-CM

## 2015-03-27 DIAGNOSIS — K224 Dyskinesia of esophagus: Secondary | ICD-10-CM | POA: Diagnosis present

## 2015-03-27 DIAGNOSIS — I129 Hypertensive chronic kidney disease with stage 1 through stage 4 chronic kidney disease, or unspecified chronic kidney disease: Secondary | ICD-10-CM | POA: Diagnosis present

## 2015-03-27 DIAGNOSIS — J690 Pneumonitis due to inhalation of food and vomit: Secondary | ICD-10-CM | POA: Insufficient documentation

## 2015-03-27 DIAGNOSIS — E785 Hyperlipidemia, unspecified: Secondary | ICD-10-CM | POA: Diagnosis present

## 2015-03-27 DIAGNOSIS — R05 Cough: Secondary | ICD-10-CM | POA: Diagnosis present

## 2015-03-27 DIAGNOSIS — Y95 Nosocomial condition: Secondary | ICD-10-CM | POA: Diagnosis present

## 2015-03-27 DIAGNOSIS — F79 Unspecified intellectual disabilities: Secondary | ICD-10-CM

## 2015-03-27 DIAGNOSIS — R1110 Vomiting, unspecified: Secondary | ICD-10-CM

## 2015-03-27 DIAGNOSIS — Z8249 Family history of ischemic heart disease and other diseases of the circulatory system: Secondary | ICD-10-CM

## 2015-03-27 DIAGNOSIS — N189 Chronic kidney disease, unspecified: Secondary | ICD-10-CM

## 2015-03-27 DIAGNOSIS — Z8669 Personal history of other diseases of the nervous system and sense organs: Secondary | ICD-10-CM | POA: Diagnosis not present

## 2015-03-27 DIAGNOSIS — A0811 Acute gastroenteropathy due to Norwalk agent: Secondary | ICD-10-CM | POA: Diagnosis not present

## 2015-03-27 DIAGNOSIS — R569 Unspecified convulsions: Secondary | ICD-10-CM | POA: Diagnosis present

## 2015-03-27 DIAGNOSIS — K5669 Other intestinal obstruction: Secondary | ICD-10-CM | POA: Diagnosis not present

## 2015-03-27 DIAGNOSIS — N179 Acute kidney failure, unspecified: Secondary | ICD-10-CM

## 2015-03-27 DIAGNOSIS — J189 Pneumonia, unspecified organism: Secondary | ICD-10-CM | POA: Diagnosis present

## 2015-03-27 DIAGNOSIS — I517 Cardiomegaly: Secondary | ICD-10-CM | POA: Diagnosis not present

## 2015-03-27 DIAGNOSIS — K56609 Unspecified intestinal obstruction, unspecified as to partial versus complete obstruction: Secondary | ICD-10-CM

## 2015-03-27 DIAGNOSIS — R140 Abdominal distension (gaseous): Secondary | ICD-10-CM

## 2015-03-27 DIAGNOSIS — G40909 Epilepsy, unspecified, not intractable, without status epilepticus: Secondary | ICD-10-CM

## 2015-03-27 LAB — CBC WITH DIFFERENTIAL/PLATELET
Basophils Absolute: 0 10*3/uL (ref 0.0–0.1)
Basophils Relative: 0 %
EOS PCT: 0 %
Eosinophils Absolute: 0 10*3/uL (ref 0.0–0.7)
HEMATOCRIT: 43.4 % (ref 39.0–52.0)
Hemoglobin: 15.1 g/dL (ref 13.0–17.0)
LYMPHS PCT: 5 %
Lymphs Abs: 0.6 10*3/uL — ABNORMAL LOW (ref 0.7–4.0)
MCH: 32.9 pg (ref 26.0–34.0)
MCHC: 34.8 g/dL (ref 30.0–36.0)
MCV: 94.6 fL (ref 78.0–100.0)
MONOS PCT: 7 %
Monocytes Absolute: 0.9 10*3/uL (ref 0.1–1.0)
NEUTROS ABS: 11.2 10*3/uL — AB (ref 1.7–7.7)
Neutrophils Relative %: 88 %
PLATELETS: 366 10*3/uL (ref 150–400)
RBC: 4.59 MIL/uL (ref 4.22–5.81)
RDW: 13 % (ref 11.5–15.5)
WBC: 12.8 10*3/uL — ABNORMAL HIGH (ref 4.0–10.5)

## 2015-03-27 LAB — URINALYSIS, ROUTINE W REFLEX MICROSCOPIC
Bilirubin Urine: NEGATIVE
GLUCOSE, UA: NEGATIVE mg/dL
HGB URINE DIPSTICK: NEGATIVE
Ketones, ur: NEGATIVE mg/dL
Leukocytes, UA: NEGATIVE
Nitrite: NEGATIVE
PH: 5 (ref 5.0–8.0)
Protein, ur: NEGATIVE mg/dL
Specific Gravity, Urine: 1.01 (ref 1.005–1.030)

## 2015-03-27 LAB — BASIC METABOLIC PANEL
ANION GAP: 10 (ref 5–15)
BUN: 22 mg/dL (ref 7–25)
BUN: 26 mg/dL — ABNORMAL HIGH (ref 6–20)
CALCIUM: 9.9 mg/dL (ref 8.6–10.3)
CHLORIDE: 99 mmol/L (ref 98–110)
CO2: 21 mmol/L (ref 20–31)
CO2: 23 mmol/L (ref 22–32)
Calcium: 9.4 mg/dL (ref 8.9–10.3)
Chloride: 98 mmol/L — ABNORMAL LOW (ref 101–111)
Creat: 2.27 mg/dL — ABNORMAL HIGH (ref 0.70–1.33)
Creatinine, Ser: 2.48 mg/dL — ABNORMAL HIGH (ref 0.61–1.24)
GFR calc Af Amer: 32 mL/min — ABNORMAL LOW (ref >60)
GFR calc non Af Amer: 27 mL/min — ABNORMAL LOW (ref >60)
GLUCOSE: 123 mg/dL — AB (ref 65–99)
Glucose, Bld: 137 mg/dL — ABNORMAL HIGH (ref 65–99)
POTASSIUM: 4.3 mmol/L (ref 3.5–5.1)
Potassium: 5.1 mmol/L (ref 3.5–5.3)
Sodium: 135 mmol/L (ref 135–146)
Sodium: 131 mmol/L — ABNORMAL LOW (ref 135–145)

## 2015-03-27 LAB — I-STAT CG4 LACTIC ACID, ED: Lactic Acid, Venous: 1.83 mmol/L (ref 0.5–2.0)

## 2015-03-27 MED ORDER — VANCOMYCIN HCL 10 G IV SOLR
1500.0000 mg | Freq: Once | INTRAVENOUS | Status: AC
  Administered 2015-03-27: 1500 mg via INTRAVENOUS
  Filled 2015-03-27: qty 1500

## 2015-03-27 MED ORDER — IPRATROPIUM-ALBUTEROL 0.5-2.5 (3) MG/3ML IN SOLN
3.0000 mL | Freq: Once | RESPIRATORY_TRACT | Status: AC
  Administered 2015-03-27: 3 mL via RESPIRATORY_TRACT

## 2015-03-27 MED ORDER — DEXTROSE 5 % IV SOLN
1.0000 g | Freq: Three times a day (TID) | INTRAVENOUS | Status: DC
  Administered 2015-03-27 – 2015-03-28 (×2): 1 g via INTRAVENOUS
  Filled 2015-03-27 (×2): qty 1

## 2015-03-27 MED ORDER — VANCOMYCIN HCL 10 G IV SOLR
1250.0000 mg | INTRAVENOUS | Status: DC
  Administered 2015-03-28: 1250 mg via INTRAVENOUS
  Filled 2015-03-27: qty 1250

## 2015-03-27 MED ORDER — SODIUM CHLORIDE 0.9 % IV BOLUS (SEPSIS)
1000.0000 mL | Freq: Once | INTRAVENOUS | Status: AC
  Administered 2015-03-27: 1000 mL via INTRAVENOUS

## 2015-03-27 NOTE — ED Notes (Signed)
Patient presents for recent hospitalization for SBO, pneumonia and aspiration. Went to PCP concerned patient wasn't improving. Chest x ray and blood done at PCP. Chest x ray showed left lobe pneumonia not improving and creatinine up to 2.27 since 12/19. Patient has history of acute renal failure. Family denies recent, fevers or n/v.

## 2015-03-27 NOTE — Progress Notes (Signed)
ANTIBIOTIC CONSULT NOTE - INITIAL  Pharmacy Consult for vancomycin and aztreonam Indication: pneumonia/HCAP  Allergies  Allergen Reactions  . Nsaids Other (See Comments)    Only one kidney.   . Ofloxacin     Unsure   . Phenothiazines Other (See Comments)    Unknown    Patient Measurements: Weight: 185 lb (83.915 kg)   Vital Signs: Temp: 100.7 F (38.2 C) (12/27 2045) Temp Source: Rectal (12/27 2045) BP: 137/99 mmHg (12/27 2110) Pulse Rate: 100 (12/27 2110) Intake/Output from previous day:   Intake/Output from this shift:    Labs:  Recent Labs  03/27/15 1106 03/27/15 2029  WBC  --  12.8*  HGB  --  15.1  PLT  --  366  CREATININE 2.27* 2.48*   Estimated Creatinine Clearance: 37.1 mL/min (by C-G formula based on Cr of 2.48). No results for input(s): VANCOTROUGH, VANCOPEAK, VANCORANDOM, GENTTROUGH, GENTPEAK, GENTRANDOM, TOBRATROUGH, TOBRAPEAK, TOBRARND, AMIKACINPEAK, AMIKACINTROU, AMIKACIN in the last 72 hours.   Microbiology: No results found for this or any previous visit (from the past 720 hour(s)).  Medical History: Past Medical History  Diagnosis Date  . Autism spectrum disorder   . Hypercholesteremia   . Bowel obstruction (HCC)   . Broken leg Left  . Osteoporosis   . Mental retardation   . Hyperlipidemia   . Malnutrition (HCC)   . Thyroid disease   . CKD (chronic kidney disease) stage 3, GFR 30-59 ml/min 11/07/2013  . Detached retina     Assessment: Patient presents for recent hospitalization for SBO, pneumonia and aspiration. Went to PCP concerned patient wasn't improving. Chest x ray and blood done at PCP. Chest x ray showed left lobe pneumonia not improving and creatinine up to 2.27 since 12/19. Patient has history of acute renal failure. Family denies recent, fevers or n/v.   Scr 2.48, CrCl ~33 mls/min (N)  Goal of Therapy:  Vancomycin trough level 15-20 mcg/ml  Plan:  Aztreonam 1gm IV q8h Vancomycin 1500mg  IV x 1 then vanc 1250mg  IV  q24h Follow cultures, renal function ,clinical course   Arley Phenixllen Christin Moline RPh 03/27/2015, 9:47 PM Pager [phone]

## 2015-03-27 NOTE — ED Provider Notes (Signed)
CSN: [id number]     Arrival date & time 03/27/15  1747 History   First MD Initiated Contact with Patient 03/27/15 2011     Chief Complaint  Patient presents with  . Pneumonia  . small bowel obstruction   . Acute Renal Failure   HPI   Mr. Vo is an 59 y.o. male with h/o autism spectrum disorder, MR, CKD, HLD, recurrent SBO who presents to the ED for evaluation of cough and possible pneumonia. He is accompanied by his brother and his sister-in-law who provide his history. Pt is nonverbal at baseline. Pt was recently discharged from [facility] where he was hospitalized for SBO, AKI, and subsequent aspiration pneumonia. Pt was discharged home with [facility] which he has reportedly been taking as prescribed. Pt's brother state that they brought pt to [facility] today for evaluation of persistent cough that they were concerned was a persistent pneumonia. EMR review reveals that pt had bilateral lower lobe infiltrates at time of discharge. CXR obtained earlier today shows evidence of L lower lobe infiltrate. Pt's family members state that pt has had continued cough and has had decreased appetite but he has not had any vomiting. Pt has reportedly had loose stools since his discharge earlier this month. Pt's family has not seen any blood in his stools.   Past Medical History  Diagnosis Date  . Autism spectrum disorder   . Hypercholesteremia   . Bowel obstruction (HCC)   . Broken leg Left  . Osteoporosis   . Mental retardation   . Hyperlipidemia   . Malnutrition (HCC)   . Thyroid disease   . CKD (chronic kidney disease) stage 3, GFR 30-59 ml/min 11/07/2013  . Detached retina    Past Surgical History  Procedure Laterality Date  . Abdominal surgery    . Leg surgery    . Eye surgery Left 03-14-2013    catatract  . Appendectomy     Family History  Problem Relation Age of Onset  . Hypertension Other   . Diabetes Other   . Hyperlipidemia Other   . Scoliosis Other    Social History  Substance Use  Topics  . Smoking status: Never Smoker   . Smokeless tobacco: Never Used  . Alcohol Use: No    Review of Systems  Unable to perform ROS: Patient nonverbal      Allergies  Nsaids; Ofloxacin; and Phenothiazines  Home Medications   Prior to Admission medications   Medication Sig Start Date End Date Taking? Authorizing Provider  cholecalciferol (VITAMIN D) 1000 UNITS tablet Take 2,000 Units by mouth daily.    Historical Provider, MD  docusate sodium (COLACE) 100 MG capsule Take 1 capsule (100 mg total) by mouth 2 (two) times daily. Patient is given this medication to help with constipation. 11/10/13   Leroy Sea, MD  famotidine (PEPCID) 20 MG tablet Take 20 mg by mouth. 03/19/15 03/18/16  Historical Provider, MD  lisinopril-hydrochlorothiazide (PRINZIDE,ZESTORETIC) 10-12.5 MG per tablet Take 1 tablet by mouth daily. Taking 1/2 tab 02/06/14   Historical Provider, MD  Multiple Vitamins-Minerals (MULTIVITAMIN WITH MINERALS) tablet Take 1 tablet by mouth daily.    Historical Provider, MD  PHENobarbital (LUMINAL) 32.4 MG tablet Take 32.4 mg by mouth daily at 12 noon.    Historical Provider, MD  PHENobarbital (LUMINAL) 64.8 MG tablet Take 64.8 mg by mouth every evening.    Historical Provider, MD  polyethylene glycol (MIRALAX / GLYCOLAX) packet Take 17 g by mouth every other day. 11/10/13   Prashant  Curlene LabrumK Singh, MD  simvastatin (ZOCOR) 40 MG tablet Take 40 mg by mouth every evening.    Historical Provider, MD  SUMAtriptan (IMITREX) 100 MG tablet Take 1 tablet (100 mg total) by mouth once. May repeat in 2 hours if headache persists or recurs. 04/20/14   Agapito Gamesatherine D Metheney, MD  tamsulosin (FLOMAX) 0.4 MG CAPS capsule Take 1 capsule (0.4 mg total) by mouth daily. 11/10/13   Leroy SeaPrashant K Singh, MD   BP 143/87 mmHg  Pulse 104  Temp(Src) 98 F (36.7 C) (Oral)  Resp 22  Wt 83.915 kg  SpO2 94% Physical Exam  HENT:  Right Ear: External ear normal.  Left Ear: External ear normal.  Nose: Nose  normal.  Mouth/Throat: Oropharynx is clear and moist. No oropharyngeal exudate.  Eyes: Conjunctivae are normal. Pupils are equal, round, and reactive to light.  Neck: Normal range of motion. Neck supple.  Cardiovascular: Regular rhythm, normal heart sounds and intact distal pulses.  Tachycardia present.   Pulmonary/Chest: Effort normal and breath sounds normal. No respiratory distress. He has no wheezes. He exhibits no tenderness.  Abdominal: Soft. Bowel sounds are normal. He exhibits no distension. There is no tenderness. There is no rebound and no guarding.  Musculoskeletal: He exhibits no edema.  Neurological: He is alert.  Skin: Skin is warm and dry.  Psychiatric: He has a normal mood and affect.  Nursing note and vitals reviewed.    Filed Vitals:   03/27/15 2045 03/27/15 2110 03/27/15 2244 03/27/15 2305  BP:  137/99 158/102   Pulse:  100 90   Temp: 100.7 F (38.2 C)  100.9 F (38.3 C) 100.6 F (38.1 C)  TempSrc: Rectal  Rectal Rectal  Resp:   18   Weight:      SpO2:  95% 96%      ED Course  Procedures (including critical care time) Labs Review Labs Reviewed  BASIC METABOLIC PANEL - Abnormal; Notable for the following:    Sodium 131 (*)    Chloride 98 (*)    Glucose, Bld 137 (*)    BUN 26 (*)    Creatinine, Ser 2.48 (*)    GFR calc non Af Amer 27 (*)    GFR calc Af Amer 32 (*)    All other components within normal limits  CBC WITH DIFFERENTIAL/PLATELET - Abnormal; Notable for the following:    WBC 12.8 (*)    Neutro Abs 11.2 (*)    Lymphs Abs 0.6 (*)    All other components within normal limits  COMPREHENSIVE METABOLIC PANEL - Abnormal; Notable for the following:    Sodium 134 (*)    Glucose, Bld 123 (*)    BUN 24 (*)    Creatinine, Ser 2.24 (*)    Calcium 8.7 (*)    GFR calc non Af Amer 31 (*)    GFR calc Af Amer 36 (*)    All other components within normal limits  CBC WITH DIFFERENTIAL/PLATELET - Abnormal; Notable for the following:    WBC 17.0 (*)     RBC 4.19 (*)    Neutro Abs 15.4 (*)    Lymphs Abs 0.6 (*)    All other components within normal limits  GLUCOSE, CAPILLARY - Abnormal; Notable for the following:    Glucose-Capillary 121 (*)    All other components within normal limits  CULTURE, BLOOD (ROUTINE X 2)  URINE CULTURE  C DIFFICILE QUICK SCREEN W PCR REFLEX  URINALYSIS, ROUTINE W REFLEX MICROSCOPIC (NOT AT [facility])  I-STAT CG4 LACTIC ACID, ED    Imaging Review Dg Chest 2 View  03/27/2015  CLINICAL DATA:  Aspiration pneumonia. EXAM: CHEST  2 VIEW COMPARISON:  11/06/2013. FINDINGS: Patient rotated to the right. Mediastinum and hilar structures are normal. Left base atelectasis and or infiltrate. No pleural effusion pneumothorax. Cardiomegaly again noted. No pulmonary venous congestion . No acute bony abnormality. Dilated loops of probable small and large bowel noted suggesting adynamic ileus. Abdominal series can be obtained for further evaluation. IMPRESSION: 1.  Left lower lobe atelectasis and/or infiltrate. 2.  Cardiomegaly. 3. Possible adynamic ileus. Abdominal series can be obtained for further evaluation . Electronically Signed   By: Maisie Fus  Register   On: 03/27/2015 11:35   I have personally reviewed and evaluated these images and lab results as part of my medical decision-making.   EKG Interpretation None      MDM   Final diagnoses:  HCAP (healthcare-associated pneumonia)   Pt with L lower lobe infiltrate that is unclear if it is improving or persistent from his prior CXR from discharge earlier this month. However, given continued cough, fever, elevated white count, and tachycardia, with infiltrate on CXR, will call hospitalist for admission for HCAP/sepsis. I spoke to pharmacist for abx recs given pt's renal function and co-morbidities including history of seizure d/o on phenobarbital and they recommended aztreonam and vanc. If there is a concern about possible aspiration can cover with Flagyl. Pt apparently became  hypernatremic with Zosyn during his prior hospitalization. Pt started on IV aztreonam and vanc in the ED per pharmacy consult. Will admit to hospitalist service.   Carlene Coria, PA-C 03/28/15 1015  Tilden Fossa, MD 03/29/15 [phone]

## 2015-03-27 NOTE — Progress Notes (Signed)
[name] consulted by EDPA regarding private sitter for hospital and home.  EDCM spoke to patient and his brother and sister law at bedside.  Patient's brother Richard [name] [phone] and sister in law Richard [name] [phone].  [name] reports the patient comes to visit 2 months out of the year in June and December.  Patient lives in [place] with permanent caretaker Richard [name].  Patient's family reports they do not need and services or dme at home.  They just wanted to know if patient could have a sitter here.  Patient's brother reports patient tends to "pull things out."  Richard [name] discussed patient with ED charge RN and ED RN.  EDRN will pass on in report to unit to have hospitalist place order for safety sitter.  Patient's brother will stay with patient this evening.  Patient has 24/7 care at home by patient's brother.  No further EDCM needs at this time.

## 2015-03-27 NOTE — Progress Notes (Signed)
   Subjective:    Patient ID: Richard [name], male    DOB: 09/18/1955, 59 y.o.   MRN: [id number]  HPI  Patient is a 59 year old autistic male who presents to the clinic with his brother for hospital follow-up of aspiration pneumonia and small bowel obstruction with acute renal failure. Pt actually lives in [place] most of the year.  Patient is nonverbal and brother does all the communicating for him. On 03/14/2015 patient was sent to the hospital for small bowel obstruction. He was also diagnosed with pneumonia. After nasogastric to, hydration, oral antibiotics he was discharged with Omnicef. He has 3 days left of Omnicef. He continues to have a productive cough. Patient's brother denies any fevers. There was concern that patient continues to re-aspirate. They initially ordered a swallow test but it was not done after a pulmonary evaluation in the hospital. Patient doesn't have a history of renal disease. And was in CK D3 at hospital. His last serum creatinine was 1.5 on 03/19/15. Patient is drinking a lot of water. Patient is having bowel movements but they are mostly loose. He is able to eat solid foods. He denies any fever.   Review of Systems  All other systems reviewed and are negative.      Objective:   Physical Exam  Constitutional: He appears well-developed and well-nourished.  HENT:  Head: Normocephalic and atraumatic.  Cardiovascular: Normal heart sounds.   Tachycardia at 110.   Pulmonary/Chest:  No wheezing heard on exam. There was scattered rhonchi mostly cleared by deep cough.  Abdominal:  Distended abdomen left worse than right. Patient is nonverbal but he tries to push my hand away with palpation over entire abdomen. I was able to hear  hypoactive bowel sounds. Heard best in the right and left lower quadrants. No guarding or rebound noted.  Neurological: He is alert.  Psychiatric: He has a normal mood and affect. His behavior is normal.          Assessment & Plan:   aspiration pneumonia/cough/SBO/CKD-3-  will get stat x-ray as well as BMP to assess renal function. Duo neb given in office today. Will hold on further treatment until we get labs.   Stat chest x-ray showed left lobe infiltrate suggestive of pneumonia. It also shows some dilated bowel loops of intestines. BMP showed serum creatinine at 2.27 which had increased from 1.5 on 03/19/2015. That is certainly suggestive of acute renal failure as well as pneumonia that has not resolved. Patient was called and [facility] for further evaluation. I do think patient needs to be monitored closely as far as renal failure that this does not worsen.

## 2015-03-28 ENCOUNTER — Inpatient Hospital Stay (HOSPITAL_COMMUNITY): Payer: Medicare Other

## 2015-03-28 ENCOUNTER — Encounter (HOSPITAL_COMMUNITY): Payer: Self-pay | Admitting: Internal Medicine

## 2015-03-28 DIAGNOSIS — N179 Acute kidney failure, unspecified: Secondary | ICD-10-CM

## 2015-03-28 DIAGNOSIS — J189 Pneumonia, unspecified organism: Secondary | ICD-10-CM

## 2015-03-28 DIAGNOSIS — N189 Chronic kidney disease, unspecified: Secondary | ICD-10-CM

## 2015-03-28 DIAGNOSIS — Z8669 Personal history of other diseases of the nervous system and sense organs: Secondary | ICD-10-CM

## 2015-03-28 LAB — CBC WITH DIFFERENTIAL/PLATELET
BASOS ABS: 0 10*3/uL (ref 0.0–0.1)
Basophils Relative: 0 %
Eosinophils Absolute: 0 10*3/uL (ref 0.0–0.7)
Eosinophils Relative: 0 %
HEMATOCRIT: 39.3 % (ref 39.0–52.0)
Hemoglobin: 13.4 g/dL (ref 13.0–17.0)
LYMPHS PCT: 3 %
Lymphs Abs: 0.6 10*3/uL — ABNORMAL LOW (ref 0.7–4.0)
MCH: 32 pg (ref 26.0–34.0)
MCHC: 34.1 g/dL (ref 30.0–36.0)
MCV: 93.8 fL (ref 78.0–100.0)
Monocytes Absolute: 1 10*3/uL (ref 0.1–1.0)
Monocytes Relative: 6 %
NEUTROS ABS: 15.4 10*3/uL — AB (ref 1.7–7.7)
NEUTROS PCT: 91 %
PLATELETS: 321 10*3/uL (ref 150–400)
RBC: 4.19 MIL/uL — AB (ref 4.22–5.81)
RDW: 13.1 % (ref 11.5–15.5)
WBC: 17 10*3/uL — AB (ref 4.0–10.5)

## 2015-03-28 LAB — COMPREHENSIVE METABOLIC PANEL
ALT: 21 U/L (ref 17–63)
ANION GAP: 10 (ref 5–15)
AST: 18 U/L (ref 15–41)
Albumin: 3.7 g/dL (ref 3.5–5.0)
Alkaline Phosphatase: 89 U/L (ref 38–126)
BILIRUBIN TOTAL: 0.5 mg/dL (ref 0.3–1.2)
BUN: 24 mg/dL — ABNORMAL HIGH (ref 6–20)
CO2: 22 mmol/L (ref 22–32)
Calcium: 8.7 mg/dL — ABNORMAL LOW (ref 8.9–10.3)
Chloride: 102 mmol/L (ref 101–111)
Creatinine, Ser: 2.24 mg/dL — ABNORMAL HIGH (ref 0.61–1.24)
GFR calc Af Amer: 36 mL/min — ABNORMAL LOW (ref >60)
GFR, EST NON AFRICAN AMERICAN: 31 mL/min — AB (ref >60)
Glucose, Bld: 123 mg/dL — ABNORMAL HIGH (ref 65–99)
POTASSIUM: 4.5 mmol/L (ref 3.5–5.1)
Sodium: 134 mmol/L — ABNORMAL LOW (ref 135–145)
TOTAL PROTEIN: 6.7 g/dL (ref 6.5–8.1)

## 2015-03-28 LAB — GLUCOSE, CAPILLARY
GLUCOSE-CAPILLARY: 121 mg/dL — AB (ref 65–99)
GLUCOSE-CAPILLARY: 74 mg/dL (ref 65–99)
Glucose-Capillary: 99 mg/dL (ref 65–99)
Glucose-Capillary: 101 mg/dL — ABNORMAL HIGH (ref 65–99)

## 2015-03-28 LAB — C DIFFICILE QUICK SCREEN W PCR REFLEX
C DIFFICILE (CDIFF) INTERP: NEGATIVE
C Diff antigen: NEGATIVE
C Diff toxin: NEGATIVE

## 2015-03-28 MED ORDER — CHLORHEXIDINE GLUCONATE 0.12 % MT SOLN
15.0000 mL | Freq: Two times a day (BID) | OROMUCOSAL | Status: DC
  Administered 2015-03-28 – 2015-03-31 (×5): 15 mL via OROMUCOSAL
  Filled 2015-03-28 (×6): qty 15

## 2015-03-28 MED ORDER — ENOXAPARIN SODIUM 40 MG/0.4ML ~~LOC~~ SOLN
40.0000 mg | SUBCUTANEOUS | Status: DC
  Administered 2015-03-28 – 2015-03-31 (×4): 40 mg via SUBCUTANEOUS
  Filled 2015-03-28 (×4): qty 0.4

## 2015-03-28 MED ORDER — HYDRALAZINE HCL 20 MG/ML IJ SOLN: 10.0000 mg | INTRAMUSCULAR | Status: DC | PRN

## 2015-03-28 MED ORDER — DEXTROSE 5 % IV SOLN
2.0000 g | Freq: Every day | INTRAVENOUS | Status: DC
  Filled 2015-03-28: qty 2

## 2015-03-28 MED ORDER — CETYLPYRIDINIUM CHLORIDE 0.05 % MT LIQD
7.0000 mL | Freq: Two times a day (BID) | OROMUCOSAL | Status: DC
  Administered 2015-03-29 – 2015-03-30 (×2): 7 mL via OROMUCOSAL

## 2015-03-28 MED ORDER — DEXTROSE 5 % IV SOLN
2.0000 g | INTRAVENOUS | Status: DC
  Administered 2015-03-29 – 2015-03-30 (×2): 2 g via INTRAVENOUS
  Filled 2015-03-28 (×2): qty 2

## 2015-03-28 MED ORDER — ACETAMINOPHEN 325 MG PO TABS: 650.0000 mg | ORAL_TABLET | Freq: Four times a day (QID) | ORAL | Status: DC | PRN

## 2015-03-28 MED ORDER — DEXTROSE 5 % IV SOLN
2.0000 g | INTRAVENOUS | Status: AC
  Administered 2015-03-28: 2 g via INTRAVENOUS
  Filled 2015-03-28: qty 2

## 2015-03-28 MED ORDER — PHENOBARBITAL SODIUM 130 MG/ML IJ SOLN
33.0000 mg | Freq: Every day | INTRAMUSCULAR | Status: DC
  Administered 2015-03-28 – 2015-03-31 (×4): 32.5 mg via INTRAVENOUS
  Filled 2015-03-28 (×4): qty 1

## 2015-03-28 MED ORDER — ONDANSETRON HCL 4 MG PO TABS: 4.0000 mg | ORAL_TABLET | Freq: Four times a day (QID) | ORAL | Status: DC | PRN

## 2015-03-28 MED ORDER — SODIUM CHLORIDE 0.9 % IV SOLN
INTRAVENOUS | Status: DC
  Administered 2015-03-28: 04:00:00 via INTRAVENOUS

## 2015-03-28 MED ORDER — ONDANSETRON HCL 4 MG/2ML IJ SOLN: 4.0000 mg | Freq: Four times a day (QID) | INTRAMUSCULAR | Status: DC | PRN

## 2015-03-28 MED ORDER — ACETAMINOPHEN 650 MG RE SUPP
650.0000 mg | Freq: Four times a day (QID) | RECTAL | Status: DC | PRN
  Administered 2015-03-29: 650 mg via RECTAL
  Filled 2015-03-28: qty 1

## 2015-03-28 MED ORDER — DIPHENHYDRAMINE HCL 50 MG/ML IJ SOLN
50.0000 mg | Freq: Once | INTRAMUSCULAR | Status: AC
  Administered 2015-03-28: 50 mg via INTRAVENOUS
  Filled 2015-03-28: qty 1

## 2015-03-28 MED ORDER — SODIUM CHLORIDE 0.9 % IV SOLN
INTRAVENOUS | Status: DC
  Administered 2015-03-28: 17:00:00 via INTRAVENOUS

## 2015-03-28 MED ORDER — PHENOBARBITAL SODIUM 130 MG/ML IJ SOLN
65.0000 mg | Freq: Every day | INTRAMUSCULAR | Status: DC
  Administered 2015-03-28 – 2015-03-30 (×3): 65 mg via INTRAVENOUS
  Filled 2015-03-28 (×3): qty 1

## 2015-03-28 NOTE — ED Notes (Signed)
Pt family requesting for pt to not be stuck for second set of blood cultures at this time

## 2015-03-28 NOTE — H&P (Signed)
[facility] History and Physical  Richard PaganiniCharles Kresse ZOX:[id number] DOB: [birth year] DOA: 03/27/2015  Referring physician: Ms.Sam. PCP: Nani GasserMETHENEY,CATHERINE, MD  Specialists: None.  Chief Complaint: Cough.  History obtained from patient's brother as patient has mental retardation.  HPI: Richard [name] is a 59 y.o. male with history of recurrent small bowel obstruction last admitted in [facility] 2 weeks ago for bowel obstruction and was managed conservatively has been having persistent cough over the last 10 days since discharge. Patient is on empiric antibiotics for possible aspiration. Patient also was recently started on Pepcid for aspiration despite which patient is still having cough. In the ER patient was found be febrile with leukocytosis and chest x-ray shows possible infiltrate. On exam patient also has distended abdomen and chest x-ray was showing possible adynamic ileus. Patient in the ER did have one episode of nausea vomiting. Patient did have diarrhea every day at least one episode. At this time patient had been admitted for pneumonia with further workup for distended abdomen.   Review of Systems: As presented in the history of presenting illness, rest negative.  Past Medical History  Diagnosis Date  . Autism spectrum disorder   . Hypercholesteremia   . Bowel obstruction (HCC)   . Broken leg Left  . Osteoporosis   . Mental retardation   . Hyperlipidemia   . Malnutrition (HCC)   . Thyroid disease   . CKD (chronic kidney disease) stage 3, GFR 30-59 ml/min 11/07/2013  . Detached retina    Past Surgical History  Procedure Laterality Date  . Abdominal surgery    . Leg surgery    . Eye surgery Left 03-14-2013    catatract  . Appendectomy     Social History:  reports that he has never smoked. He has never used smokeless tobacco. He reports that he does not drink alcohol or use illicit drugs. Where does patient live home. Can patient participate in ADLs?  No.  Allergies  Allergen Reactions  . Nsaids Other (See Comments)    Only one kidney.   . Ofloxacin     Unsure   . Phenothiazines Other (See Comments)    Unknown    Family History:  Family History  Problem Relation Age of Onset  . Hypertension Other   . Diabetes Other   . Hyperlipidemia Other   . Scoliosis Other       Prior to Admission medications   Medication Sig Start Date End Date Taking? Authorizing Provider  Calcium Carbonate-Vitamin D 600-400 MG-UNIT tablet Take 1 tablet by mouth 2 (two) times daily.    Yes Historical Provider, MD  cefdinir (OMNICEF) 300 MG capsule Take 300 mg by mouth every 12 (twelve) hours. ABT Start Date 03/21/15 & End Date 03/30/15. 03/20/15  Yes Historical Provider, MD  cholecalciferol (VITAMIN D) 1000 UNITS tablet Take 2,000 Units by mouth daily.   Yes Historical Provider, MD  docusate sodium (COLACE) 100 MG capsule Take 1 capsule (100 mg total) by mouth 2 (two) times daily. Patient is given this medication to help with constipation. 11/10/13  Yes Leroy SeaPrashant K Singh, MD  famotidine (PEPCID) 20 MG tablet Take 20 mg by mouth 2 (two) times daily.  03/19/15 03/18/16 Yes Historical Provider, MD  lisinopril-hydrochlorothiazide (PRINZIDE,ZESTORETIC) 10-12.5 MG per tablet Take 0.5 tablets by mouth daily. Taking 1/2 tab 02/06/14  Yes Historical Provider, MD  Multiple Vitamins-Minerals (MULTIVITAMIN WITH MINERALS) tablet Take 1 tablet by mouth daily.   Yes Historical Provider, MD  PHENobarbital (LUMINAL) 32.4 MG tablet Take  32.4-64.8 mg by mouth 2 (two) times daily. Take 1 tablet (32.4 mg) at noon and Take 2 tablets (64.8 mg) at bedtime.   Yes Historical Provider, MD  polyethylene glycol (MIRALAX / GLYCOLAX) packet Take 17 g by mouth every other day. 11/10/13  Yes Leroy Sea, MD  simvastatin (ZOCOR) 40 MG tablet Take 40 mg by mouth every evening.   Yes Historical Provider, MD  SUMAtriptan (IMITREX) 100 MG tablet Take 1 tablet (100 mg total) by mouth once. May  repeat in 2 hours if headache persists or recurs. 04/20/14  Yes Agapito Games, MD  tamsulosin (FLOMAX) 0.4 MG CAPS capsule Take 1 capsule (0.4 mg total) by mouth daily. 11/10/13  Yes Leroy Sea, MD  traZODone (DESYREL) 100 MG tablet Take 100 mg by mouth at bedtime as needed for sleep.  02/27/15  Yes Historical Provider, MD    Physical Exam: Filed Vitals:   03/27/15 2244 03/27/15 2305 03/28/15 0126 03/28/15 0236  BP: 158/102  161/95 151/99  Pulse: 90  98 97  Temp: 100.9 F (38.3 C) 100.6 F (38.1 C)  98.2 F (36.8 C)  TempSrc: Rectal Rectal  Oral  Resp: Height:    6' (1.829 m)  Weight:    82.8 kg (182 lb 8.7 oz)  SpO2: 96%  97% 97%     General:  Moderately built and nourished.  Eyes: Anicteric. No pallor.  ENT: No discharge from the ears eyes nose and mouth.  Neck: No mass felt. No neck rigidity.  Cardiovascular: S1-S2 heard.  Respiratory: No rhonchi or crepitations.  Abdomen: Distended bowel sounds are appreciated. No guarding or rigidity.  Skin: No rash.  Musculoskeletal: No edema.  Psychiatric: Patient has mental retardation.  Neurologic: Alert awake. Patient has mental retardation.  Labs on Admission:  Basic Metabolic Panel:  Recent Labs Lab 03/27/15 1106 03/27/15 2029  NA 135 131*  K 5.1 4.3  CL 99 98*  CO2 21 23  GLUCOSE 123* 137*  BUN 22 26*  CREATININE 2.27* 2.48*  CALCIUM 9.9 9.4   Liver Function Tests: No results for input(s): AST, ALT, ALKPHOS, BILITOT, PROT, ALBUMIN in the last 168 hours. No results for input(s): LIPASE, AMYLASE in the last 168 hours. No results for input(s): AMMONIA in the last 168 hours. CBC:  Recent Labs Lab 03/27/15 2029  WBC 12.8*  NEUTROABS 11.2*  HGB 15.1  HCT 43.4  MCV 94.6  PLT 366   Cardiac Enzymes: No results for input(s): CKTOTAL, CKMB, CKMBINDEX, TROPONINI in the last 168 hours.  BNP (last 3 results) No results for input(s): BNP in the last 8760 hours.  ProBNP (last 3  results) No results for input(s): PROBNP in the last 8760 hours.  CBG: No results for input(s): GLUCAP in the last 168 hours.  Radiological Exams on Admission: Dg Chest 2 View  03/27/2015  CLINICAL DATA:  Aspiration pneumonia. EXAM: CHEST  2 VIEW COMPARISON:  11/06/2013. FINDINGS: Patient rotated to the right. Mediastinum and hilar structures are normal. Left base atelectasis and or infiltrate. No pleural effusion pneumothorax. Cardiomegaly again noted. No pulmonary venous congestion . No acute bony abnormality. Dilated loops of probable small and large bowel noted suggesting adynamic ileus. Abdominal series can be obtained for further evaluation. IMPRESSION: 1.  Left lower lobe atelectasis and/or infiltrate. 2.  Cardiomegaly. 3. Possible adynamic ileus. Abdominal series can be obtained for further evaluation . Electronically Signed   By: Maisie Fus  Register   On: 03/27/2015 11:35  Assessment/Plan Principal Problem:   HCAP (healthcare-associated pneumonia) Active Problems:   History of seizures   MR (mental retardation)   Renal failure (ARF), acute on chronic (HCC)   Pneumonia   1. Healthcare associated pneumonia - patient has been placed on vancomycin and cefepime for healthcare associated pneumonia. Consent for aspiration for which I have requested swallow evaluation. 2. Abdominal distention with history of recurrent episodes of small bowel obstruction - at this time I have ordered a dedicated x-ray of the abdomen. I had kept patient nothing by mouth. May need CT abdomen if x-ray abdomen does show ileus or obstruction. 3. Diarrhea - check stool for C. Difficile. 4. Acute on chronic renal failure - probably from dehydration. Gently hydrate and closely follow intake output and metabolic panel. Hold lisinopril and hydrochlorothiazide secondary to renal failure. 5. Hypertension - holding off lisinopril and hydrochlorothiazide secondary to renal failure. Patient is on when necessary IV  hydralazine for systolic blood pressure more than 160. 6. History of seizures - I have requested pharmacy to dose patient's phenobarbital dose IV. 7. Hyperlipidemia on statins. Presently nothing by mouth.   DVT Prophylaxis Lovenox.  Code Status: Full code.  Family Communication: Patient's brother.  Disposition Plan: Admit to inpatient.    Astaria Nanez N. [facility] Pager [phone].  If 7PM-7AM, please contact night-coverage [email or web address] Password [email or web address] 03/28/2015, 4:30 AM

## 2015-03-28 NOTE — Progress Notes (Addendum)
Patient seen and examined, patient does not communicate, does follow command at time with family member's encouragement continued diarrhea, though does not seem in pain, no fever, c diff negative, gi pathogen pending, likely viral, per family, family member had similar symptoms recently. Health care associated pan +/- Presumed aspiration pna, on abx, per speech ? Esophagus dysmotility? Dg esophagus ordered.  Ileus? No n/v, no ab pain, has diarrhea, repeat kub in am, keep npo for now.

## 2015-03-28 NOTE — Evaluation (Signed)
Clinical/Bedside Swallow Evaluation Patient Details  Name: Richard PaganiniCharles [name] MRN: [id number] Date of Birth: [birth year]  Today's Date: 03/28/2015 Time: SLP Start Time (ACUTE ONLY): 1550 SLP Stop Time (ACUTE ONLY): 1630 SLP Time Calculation (min) (ACUTE ONLY): 40 min  Past Medical History:  Past Medical History  Diagnosis Date  . Autism spectrum disorder   . Hypercholesteremia   . Bowel obstruction (HCC)   . Broken leg Left  . Osteoporosis   . Mental retardation   . Hyperlipidemia   . Malnutrition (HCC)   . Thyroid disease   . CKD (chronic kidney disease) stage 3, GFR 30-59 ml/min 11/07/2013  . Detached retina    Past Surgical History:  Past Surgical History  Procedure Laterality Date  . Abdominal surgery    . Leg surgery    . Eye surgery Left 03-14-2013    catatract  . Appendectomy     HPI:  59 yo male [facility] with h/o MR, autism, recent aspiration pna likely due to emesis aspiration, SBOs, and seizures.   Swallow evaluation was ordered by Md.  CXR showed left lower lobe ATX or infiltrate.  Possible ileus or adynamic colon noted per imaging study- Abd DG showed not definitive obstruction.  Sister in law Richard [name] reports pt coughs mostly AFTER intake x 1 1/2 years that has worsened recently.  She does not know for certain if dysphagia symptoms worsen during bowel deficit exacerbations.  Besides pt's recent pna, sister in law does not recall other pna episodes and reports pt generally has a good appetite.  Daughter in law reports pt recently was given a reflux medication but did not observe improvement in cough on said medication.  Of note, Richard [name] *sister in law* reports it was difficult to intubate pt for recent eye surgery - ? Impact on pharyngeal, cervical esophageal function?    Assessment / Plan / Recommendation Clinical Impression  Pt presents with signs consistent with suspected primary esophageal dysphagia.   Concern for aspiration AFTER intake noted characterized by delayed  coughing epsiodes x 3 AFTER eating approximately 4 ounces jello and 4 ounces apple juice.  Coughing was not productive but did appear discomforting for pt.  Richard [name] reports this is typical of observations at home.    Pt CN exam (of which he would participate) was unremarkable however pt is dysarthric evidenced by rapid rate of speech and imprecise articulation.  Swallow was minimally delayed *suspect oral* and pt takes very large boluses due to impulsivity.    Sister in law Richard [name] present and reports pt primarily coughs after eating at home, even if just consuming liquid or solids alone.  Coughing after intake noted x 1 1/2 years but worsened recently per Richard ReiningNicole.    Richard [name] states pt had a prior endoscopy but does not recall results.    Recommend to consider dedicated esophageal evaluation - (? limited barium indicated due to pt's possible ileus).   SLP spoke to Md regarding concerns and made Richard [name] aware of care plan per MD - NPO and DG esophagram next date.   Richard [name] agreeable to plan.    SLP to follow up next date to help differentiate dysphagia symptoms and aid with mitigation strategies if indicated.  Thanks for this order.     Aspiration Risk  Moderate aspiration risk    Diet Recommendation NPO   Medication Administration: Via alternative means Postural Changes:  (stay upright this evening after intake given during testing)    Other  Recommendations Recommended Consults: Consider esophageal assessment;Consider  GI evaluation Oral Care Recommendations: Oral care BID   Follow up Recommendations       Frequency and Duration     TBD       Prognosis   TBD     Swallow Study   General Date of Onset: 03/28/15 HPI: 59 yo male adm to [facility] with h/o MR, autism, recent aspiration pna likely due to emesis aspiration, SBOs, and seizures.   Swallow evaluation was ordered by Md.  CXR showed left lower lobe ATX or infiltrate.  Possible ileus or adynamic colon noted per imaging study- Abd DG showed  not definitive obstruction.  Sister in law Richard Reining reports pt coughs mostly AFTER intake x 1 1/2 years that has worsened recently.  She does not know for certain if dysphagia symptoms worsen during bowel deficit exacerbations.  Besides pt's recent pna, sister in law does not recall other pna episodes and reports pt generally has a good appetite.  Daughter in law reports pt recently was given a reflux medication but did not observe improvement in cough on said medication.   Type of Study: Bedside Swallow Evaluation Diet Prior to this Study: NPO Temperature Spikes Noted: No Respiratory Status: Room air History of Recent Intubation: No Behavior/Cognition: Alert;Doesn't follow directions (pt has baseline cognitive deficits - Autistic) Oral Cavity Assessment: Other (comment) (pt did not open mouth fully to assess, however appeared clear and moist from limited observation) Oral Care Completed by SLP: No Oral Cavity - Dentition: Adequate natural dentition Vision: Functional for self-feeding Self-Feeding Abilities: Needs set up Patient Positioning: Upright in bed Baseline Vocal Quality: Low vocal intensity Volitional Cough: Cognitively unable to elicit Volitional Swallow: Unable to elicit    Oral/Motor/Sensory Function Overall Oral Motor/Sensory Function: Other (comment) (generalized weakness,  rapid rate of speech with imprecise articulation )   Ice Chips Ice chips: Not tested   Thin Liquid Thin Liquid: Impaired Presentation: Cup;Self Fed Oral Phase Functional Implications: Other (comment) (minimal delay in swallow intermittently noted - suspect oral) Other Comments: pt does not use a straw as he never learned to suck per Lurlean Horns Thick Nectar Thick Liquid: Not tested   Honey Thick Honey Thick Liquid: Not tested   Puree Puree: Impaired Presentation: Self Fed;Spoon Oral Phase Impairments: Impaired mastication;Reduced lingual movement/coordination Oral Phase Functional Implications: Other  (comment) Pharyngeal Phase Impairments: Cough - Delayed Other Comments: pt takes very large boluses - tolerated 4 ounces of jello well until last bolus when he demonstrated overt coughing that was not productive   Solid   GO    Solid: Not tested       Mills Koller, MS [facility] SLP [phone]

## 2015-03-28 NOTE — Progress Notes (Signed)
ANTIBIOTIC CONSULT NOTE - INITIAL  Pharmacy Consult for Cefepime Indication: pneumonia  Allergies  Allergen Reactions  . Nsaids Other (See Comments)    Only one kidney.   . Ofloxacin     Unsure   . Phenothiazines Other (See Comments)    Unknown    Patient Measurements: Height: 6' (182.9 cm) Weight: 182 lb 8.7 oz (82.8 kg) IBW/kg (Calculated) : 77.6 Adjusted Body Weight:   Vital Signs: Temp: 98.2 F (36.8 C) (12/28 0236) Temp Source: Oral (12/28 0236) BP: 151/99 mmHg (12/28 0236) Pulse Rate: 97 (12/28 0236) Intake/Output from previous day:   Intake/Output from this shift:    Labs:  Recent Labs  03/27/15 1106 03/27/15 2029  WBC  --  12.8*  HGB  --  15.1  PLT  --  366  CREATININE 2.27* 2.48*   Estimated Creatinine Clearance: 36.1 mL/min (by C-G formula based on Cr of 2.48). No results for input(s): VANCOTROUGH, VANCOPEAK, VANCORANDOM, GENTTROUGH, GENTPEAK, GENTRANDOM, TOBRATROUGH, TOBRAPEAK, TOBRARND, AMIKACINPEAK, AMIKACINTROU, AMIKACIN in the last 72 hours.   Microbiology: No results found for this or any previous visit (from the past 720 hour(s)).  Medical History: Past Medical History  Diagnosis Date  . Autism spectrum disorder   . Hypercholesteremia   . Bowel obstruction (HCC)   . Broken leg Left  . Osteoporosis   . Mental retardation   . Hyperlipidemia   . Malnutrition (HCC)   . Thyroid disease   . CKD (chronic kidney disease) stage 3, GFR 30-59 ml/min 11/07/2013  . Detached retina     Medications:  Anti-infectives    Start     Dose/Rate Route Frequency Ordered Stop   03/28/15 2200  vancomycin (VANCOCIN) 1,250 mg in sodium chloride 0.9 % 250 mL IVPB     1,250 mg 166.7 mL/hr over 90 Minutes Intravenous Every 24 hours 03/27/15 2146     03/28/15 0600  ceFEPIme (MAXIPIME) 2 g in dextrose 5 % 50 mL IVPB     2 g 100 mL/hr over 30 Minutes Intravenous Daily 03/28/15 0539     03/27/15 2200  aztreonam (AZACTAM) 1 g in dextrose 5 % 50 mL IVPB  Status:   Discontinued     1 g 100 mL/hr over 30 Minutes Intravenous 3 times per day 03/27/15 2146 03/28/15 0538   03/27/15 2200  vancomycin (VANCOCIN) 1,500 mg in sodium chloride 0.9 % 500 mL IVPB     1,500 mg 250 mL/hr over 120 Minutes Intravenous  Once 03/27/15 2146 03/28/15 [id number]     Assessment: MD switched from aztreonam to cefepime per pharmacy for HCAP.  Patient with poor renal function.  Goal of Therapy:  Cefepime dosed based on patient weight and renal function   Plan:  Follow up culture results  Cefepime 2gm iv q24hr  Darlina GuysGrimsley Jr, Jacquenette ShoneJulian Crowford 03/28/2015,5:40 AM

## 2015-03-29 ENCOUNTER — Inpatient Hospital Stay (HOSPITAL_COMMUNITY): Payer: Medicare Other

## 2015-03-29 DIAGNOSIS — A0811 Acute gastroenteropathy due to Norwalk agent: Secondary | ICD-10-CM

## 2015-03-29 DIAGNOSIS — F79 Unspecified intellectual disabilities: Secondary | ICD-10-CM

## 2015-03-29 LAB — GASTROINTESTINAL PANEL BY PCR, STOOL (REPLACES STOOL CULTURE)
ADENOVIRUS F40/41: NOT DETECTED
ASTROVIRUS: NOT DETECTED
CAMPYLOBACTER SPECIES: NOT DETECTED
CRYPTOSPORIDIUM: NOT DETECTED
CYCLOSPORA CAYETANENSIS: NOT DETECTED
E. COLI O157: NOT DETECTED
ENTEROAGGREGATIVE E COLI (EAEC): NOT DETECTED
ENTEROPATHOGENIC E COLI (EPEC): NOT DETECTED
ENTEROTOXIGENIC E COLI (ETEC): NOT DETECTED
Entamoeba histolytica: NOT DETECTED
Giardia lamblia: NOT DETECTED
Norovirus GI/GII: DETECTED — AB
Plesimonas shigelloides: NOT DETECTED
Rotavirus A: NOT DETECTED
SHIGA LIKE TOXIN PRODUCING E COLI (STEC): NOT DETECTED
Salmonella species: NOT DETECTED
Sapovirus (I, II, IV, and V): NOT DETECTED
Shigella/Enteroinvasive E coli (EIEC): NOT DETECTED
VIBRIO CHOLERAE: NOT DETECTED
VIBRIO SPECIES: NOT DETECTED
YERSINIA ENTEROCOLITICA: NOT DETECTED

## 2015-03-29 LAB — URINE CULTURE: Culture: NO GROWTH

## 2015-03-29 LAB — CBC
HCT: 38 % — ABNORMAL LOW (ref 39.0–52.0)
HEMOGLOBIN: 12.5 g/dL — AB (ref 13.0–17.0)
MCH: 31.7 pg (ref 26.0–34.0)
MCHC: 32.9 g/dL (ref 30.0–36.0)
MCV: 96.4 fL (ref 78.0–100.0)
PLATELETS: 298 10*3/uL (ref 150–400)
RBC: 3.94 MIL/uL — ABNORMAL LOW (ref 4.22–5.81)
RDW: 13.4 % (ref 11.5–15.5)
WBC: 7.2 10*3/uL (ref 4.0–10.5)

## 2015-03-29 LAB — BASIC METABOLIC PANEL
Anion gap: 12 (ref 5–15)
BUN: 21 mg/dL — AB (ref 6–20)
CALCIUM: 8.9 mg/dL (ref 8.9–10.3)
CHLORIDE: 108 mmol/L (ref 101–111)
CO2: 20 mmol/L — ABNORMAL LOW (ref 22–32)
CREATININE: 1.99 mg/dL — AB (ref 0.61–1.24)
GFR calc non Af Amer: 36 mL/min — ABNORMAL LOW (ref >60)
GFR, EST AFRICAN AMERICAN: 41 mL/min — AB (ref >60)
Glucose, Bld: 84 mg/dL (ref 65–99)
Potassium: 4.6 mmol/L (ref 3.5–5.1)
SODIUM: 140 mmol/L (ref 135–145)

## 2015-03-29 LAB — TSH: TSH: 2.798 u[IU]/mL (ref 0.350–4.500)

## 2015-03-29 LAB — MRSA PCR SCREENING: MRSA BY PCR: NEGATIVE

## 2015-03-29 LAB — GLUCOSE, CAPILLARY
GLUCOSE-CAPILLARY: 59 mg/dL — AB (ref 65–99)
GLUCOSE-CAPILLARY: 66 mg/dL (ref 65–99)
GLUCOSE-CAPILLARY: 71 mg/dL (ref 65–99)
Glucose-Capillary: 75 mg/dL (ref 65–99)

## 2015-03-29 MED ORDER — VANCOMYCIN HCL IN DEXTROSE 750-5 MG/150ML-% IV SOLN
750.0000 mg | Freq: Two times a day (BID) | INTRAVENOUS | Status: DC
  Administered 2015-03-29 – 2015-03-30 (×2): 750 mg via INTRAVENOUS
  Filled 2015-03-29 (×2): qty 150

## 2015-03-29 MED ORDER — DEXTROSE-NACL 5-0.9 % IV SOLN
INTRAVENOUS | Status: DC
  Administered 2015-03-29 – 2015-03-30 (×2): via INTRAVENOUS

## 2015-03-29 NOTE — Progress Notes (Addendum)
Paged MD to make aware of GI panel result + norovirus  Paged MD at 1644 with results of bladder scan, 

## 2015-03-29 NOTE — Progress Notes (Signed)
PROGRESS NOTE  Richard [name] ZDG:[id number] DOB: 06-02-1955 DOA: 03/27/2015 PCP: [name], MD  HPI/Recap of past 24 hours:  Last diarrhea was 7am this am, per family patient seems doing better, less cough  Assessment/Plan: Principal Problem:   HCAP (healthcare-associated pneumonia) Active Problems:   History of seizures   MR (mental retardation)   Renal failure (ARF), acute on chronic (HCC)   Pneumonia  1. Healthcare associated pneumonia - patient has been placed on vancomycin and cefepime for healthcare associated pneumonia.  aspiration during vomiting also possible, swallow eval with compulsive voluntary vomiting but no overt pharyngeal phase abnormality per prelim speech eval, Dg esophagus limited exam due to not able to follow commands consistently, ? Mild lower esophageal dysmotility. Continue abx, leukocytosis resolved, consider narrow abx soon. 2. Abdominal distention with history of recurrent episodes of small bowel obstruction - kub with ileus, no sbo. nothing by mouth since admission, start clear liquid on 12/29, if not able to tolerate will consider CT abdomen. 3. Diarrhea - + norovirus, negative C. Difficile. On contact precaution 4. Acute on chronic renal failure - probably from dehydration. Gently hydrate and closely follow intake output and metabolic panel. Hold lisinopril and hydrochlorothiazide secondary to renal failure. Cr improving. 5. Hypertension - holding off lisinopril and hydrochlorothiazide secondary to renal failure. Patient is on when necessary IV hydralazine for systolic blood pressure more than 160. 6. History of seizures - I have requested pharmacy to dose patient's phenobarbital dose IV. 7. Hyperlipidemia on statins. Presently nothing by mouth.   DVT Prophylaxis Lovenox.  Code Status: Full code.  Family Communication: Patient's brother and sister in law Disposition Plan: home when medically stable  Consultants:  none  Procedures:  Dg  esophagus  Antibiotics:  vanc/cefepime   Objective: BP 111/56 mmHg  Pulse 67  Temp(Src) 98.1 F (36.7 C) (Axillary)  Resp 16  Ht 6' (1.829 m)  Wt 182 lb 8.7 oz (82.8 kg)  BMI 24.75 kg/m2  SpO2 96%  Intake/Output Summary (Last 24 hours) at 03/29/15 1641 Last data filed at 03/29/15 1500  Gross per 24 hour  Intake 4068.34 ml  Output    200 ml  Net 3868.34 ml   Filed Weights   03/27/15 1812 03/28/15 0236  Weight: 185 lb (83.915 kg) 182 lb 8.7 oz (82.8 kg)    Exam:   General:  NAD, baseline mental retardation, nonverbal, follow command intermittently with family member encoragement  Cardiovascular: RRR  Respiratory: CTABL  Abdomen: Soft/ND/NT, positive BS  Musculoskeletal: No Edema  Neuro: baseline mental retardation  Data Reviewed: Basic Metabolic Panel:  Recent Labs Lab 03/27/15 1106 03/27/15 2029 03/28/15 0545 03/29/15 0506  NA 135 131* 134* 140  K 5.1 4.3 4.5 4.6  CL 99 98* 102 108  CO2 20*  GLUCOSE 123* 137* 123* 84  BUN 22 26* 24* 21*  CREATININE 2.27* 2.48* 2.24* 1.99*  CALCIUM 9.9 9.4 8.7* 8.9   Liver Function Tests:  Recent Labs Lab 03/28/15 0545  AST 18  ALT 21  ALKPHOS 89  BILITOT 0.5  PROT 6.7  ALBUMIN 3.7   No results for input(s): LIPASE, AMYLASE in the last 168 hours. No results for input(s): AMMONIA in the last 168 hours. CBC:  Recent Labs Lab 03/27/15 2029 03/28/15 0545 03/29/15 0506  WBC 12.8* 17.0* 7.2  NEUTROABS 11.2* 15.4*  --   HGB 15.1 13.4 12.5*  HCT 43.4 39.3 38.0*  MCV 94.6 93.8 96.4  PLT 366 321 298   Cardiac Enzymes:  No results for input(s): CKTOTAL, CKMB, CKMBINDEX, TROPONINI in the last 168 hours. BNP (last 3 results) No results for input(s): BNP in the last 8760 hours.  ProBNP (last 3 results) No results for input(s): PROBNP in the last 8760 hours.  CBG:  Recent Labs Lab 03/28/15 1754 03/28/15 2323 03/29/15 0538 03/29/15 1241 03/29/15 1420  GLUCAP 101* 74 75 59* 66     Recent Results (from the past 240 hour(s))  Urine culture     Status: None   Collection Time: 03/27/15  8:46 PM  Result Value Ref Range Status   Specimen Description URINE, CATHETERIZED  Final   Special Requests NONE  Final   Culture   Final    NO GROWTH 1 DAY Performed at [facility]    Report Status 03/29/2015 FINAL  Final  Culture, blood (routine x 2)     Status: None (Preliminary result)   Collection Time: 03/27/15  9:15 PM  Result Value Ref Range Status   Specimen Description BLOOD LEFT WRIST  Final   Special Requests BOTTLES DRAWN AEROBIC AND ANAEROBIC 5 ML  Final   Culture   Final    NO GROWTH 1 DAY Performed at [facility]    Report Status PENDING  Incomplete  C difficile quick scan w PCR reflex     Status: None   Collection Time: 03/28/15 10:58 AM  Result Value Ref Range Status   C Diff antigen NEGATIVE NEGATIVE Final   C Diff toxin NEGATIVE NEGATIVE Final   C Diff interpretation Negative for toxigenic C. difficile  Final  Gastrointestinal Panel by PCR , Stool     Status: Abnormal   Collection Time: 03/28/15  3:12 PM  Result Value Ref Range Status   Campylobacter species NOT DETECTED NOT DETECTED Final   Plesimonas shigelloides NOT DETECTED NOT DETECTED Final   Salmonella species NOT DETECTED NOT DETECTED Final   Yersinia enterocolitica NOT DETECTED NOT DETECTED Final   Vibrio species NOT DETECTED NOT DETECTED Final   Vibrio cholerae NOT DETECTED NOT DETECTED Final   Enteroaggregative E coli (EAEC) NOT DETECTED NOT DETECTED Final   Enteropathogenic E coli (EPEC) NOT DETECTED NOT DETECTED Final   Enterotoxigenic E coli (ETEC) NOT DETECTED NOT DETECTED Final   Shiga like toxin producing E coli (STEC) NOT DETECTED NOT DETECTED Final   E. coli O157 NOT DETECTED NOT DETECTED Final   Shigella/Enteroinvasive E coli (EIEC) NOT DETECTED NOT DETECTED Final   Cryptosporidium NOT DETECTED NOT DETECTED Final   Cyclospora cayetanensis NOT DETECTED NOT  DETECTED Final   Entamoeba histolytica NOT DETECTED NOT DETECTED Final   Giardia lamblia NOT DETECTED NOT DETECTED Final   Adenovirus F40/41 NOT DETECTED NOT DETECTED Final   Astrovirus NOT DETECTED NOT DETECTED Final   Norovirus GI/GII DETECTED (A) NOT DETECTED Corrected    Comment: CRITICAL RESULT CALLED TO, READ BACK BY AND VERIFIED WITH: SHANNON POTEAT 03/29/15 1520 T[facility] CORRECTED ON 12/29 AT 1634: PREVIOUSLY REPORTED AS CRITICAL RESULT CALLED TO, READ BACK BY AND VERIFIED WITH: Shamnon Poteat @ [facility] 03/29/15  RESULT CALLED TO, READ BACK BY AND VERIFIED WITH: GURLEY,K @ 1420 ON 122916 BY POTEAT,S    Rotavirus A NOT DETECTED NOT DETECTED Final   Sapovirus (I, II, IV, and V) NOT DETECTED NOT DETECTED Final     Studies: Dg Abd 1 View  03/29/2015  CLINICAL DATA:  Persistent diarrhea.  No obvious pain.  No fever. EXAM: ABDOMEN - 1 VIEW COMPARISON:  03/28/2015 FINDINGS:  There is increased bowel gas diffusely, with mild bowel dilation. Bowel dilation mildly improved when compared to the previous day's study. No convincing obstruction. Masslike opacity arises from the pelvis likely a distended bladder. Soft tissues are otherwise unremarkable. IMPRESSION: 1. Increased bowel gas with mild bowel distention. Findings may reflect a mild diffuse adynamic ileus. There has been mild decrease in bowel distention when compared to the prior exam. No evidence of bowel obstruction. 2. Distended bladder. Electronically Signed   By: Amie Portland M.D.   On: 03/29/2015 12:16   Dg Esophagus  03/29/2015  CLINICAL DATA:  Vomiting EXAM: ESOPHOGRAM/BARIUM SWALLOW TECHNIQUE: Single contrast examination was performed using  thin barium. FLUOROSCOPY TIME:  Radiation Exposure Index (as provided by the fluoroscopic device): 9.1 d Gy cm2 COMPARISON:  None. FINDINGS: Due to difficulty with following commands, limited swallows were obtained in the supine position only. Contrast was administered via oral  syringe. Mild lower esophageal dysmotility. No fixed esophageal narrowing or stricture. Patient could not be assessed for gastroesophageal reflux. IMPRESSION: Limited evaluation, as described above. Mild lower esophageal dysmotility. No fixed esophageal narrowing or stricture. Electronically Signed   By: Charline Bills M.D.   On: 03/29/2015 13:20    Scheduled Meds: . antiseptic oral rinse  7 mL Mouth Rinse q12n4p  . ceFEPime (MAXIPIME) IV  2 g Intravenous Q24H  . chlorhexidine  15 mL Mouth Rinse BID  . enoxaparin (LOVENOX) injection  40 mg Subcutaneous Q24H  . PHENObarbital  32.5 mg Intravenous Daily  . PHENObarbital  65 mg Intravenous QHS  . vancomycin  750 mg Intravenous Q12H    Continuous Infusions: . dextrose 5 % and 0.9% NaCl 75 mL/hr at 03/29/15 1434     Time spent:  Trevar Boehringer MD, PhD  [facility] Pager [phone]. If 7PM-7AM, please contact night-coverage at [email or web address], password [facility] 03/29/2015, 4:41 PM  LOS: 2 days

## 2015-03-29 NOTE — Progress Notes (Addendum)
Note results of esophagram mild lower esophageal dysmotility per report- see imaging section.     SLP to follow up next date.  ? If MD desires to start clear liquids currently with strict precautions.  Donavan Burnetamara Legacy [place], MS [name] SLP [phone]

## 2015-03-29 NOTE — Progress Notes (Signed)
Pharmacy Antibiotic Follow-up Note  Richard [name] is a 59 y.o. year-old male admitted on 03/27/2015.  The patient is currently on day #2 of Vancomycin and Cefepime for HCAP.  Assessment/Plan: The dose of vancomycin will be adjusted to 750mg  IV q12h based on renal function.  Check trough at steady state Continue cefepime 2g IV q24h Assess for de-escalation of coverage tomorrow  Temp (24hrs), Avg:98 F (36.7 C), Min:98 F (36.7 C), Max:98 F (36.7 C)   Recent Labs Lab 03/27/15 2029 03/28/15 0545 03/29/15 0506  WBC 12.8* 17.0* 7.2    Recent Labs Lab 03/27/15 1106 03/27/15 2029 03/28/15 0545 03/29/15 0506  CREATININE 2.27* 2.48* 2.24* 1.99*   Estimated Creatinine Clearance: 45 mL/min (by C-G formula based on Cr of 1.99).    Allergies  Allergen Reactions  . Nsaids Other (See Comments)    Only one kidney.   . Ofloxacin     Unsure   . Phenothiazines Other (See Comments)    Unknown    Antimicrobials this admission: 12/27 >> Aztreonam >> 12/28 12/27 >> Vancomycin >> 12/28 >> Cefepime >>  Levels/dose changes this admission: 12/29: Increase vancomycin to 750mg  q12h for improved SCr  Microbiology Results: 12/27 BCx: ngtd 12/27 UCx: NGF 12/28 Cdiff: Ag negative, Toxin negative  Thank you for allowing pharmacy to be a part of this patient's care.  Loralee PacasErin Dorsey Authement, PharmD, BCPS Pager: [phone]  03/29/2015 1:53 PM

## 2015-03-29 NOTE — Progress Notes (Signed)
Pt CBG is 61, asymptomatic, MD paged to make aware

## 2015-03-29 NOTE — Progress Notes (Signed)
Nutrition Brief Note  Patient identified on the Malnutrition Screening Tool (MST) Report  Wt Readings from Last 15 Encounters:  03/28/15 182 lb 8.7 oz (82.8 kg)  03/27/15 184 lb (83.462 kg)  03/14/15 188 lb (85.276 kg)  04/20/14 174 lb (78.926 kg)  11/07/13 182 lb 3.2 oz (82.645 kg)  04/04/13 171 lb (77.565 kg)  03/16/13 177 lb 1.6 oz (80.332 kg)  03/16/13 182 lb (82.555 kg)  02/25/13 185 lb 10 oz (84.2 kg)  09/03/12 181 lb (82.101 kg)  03/26/12 180 lb 8 oz (81.874 kg)  09/25/11 180 lb (81.647 kg)    Body mass index is 24.75 kg/(m^2). Patient meets criteria for normal weight based on current BMI.   Spoke with pt's brother/guardian at bedside. Pt is non-verbal. He states pt's appetite was down a little bit with onset of illness, but is doing much better now. Pt is currently NPO, no PO intake on file. Reports no weight loss, per chart weight has been stable for 3 years. Declined supplements or any desire thereof.   Current diet order is npo, patient is consuming approximately 0% of meals at this time. Labs and medications reviewed.   No nutrition interventions warranted at this time. If nutrition issues arise, please consult RD.   Dionne AnoWilliam M. Kanaan Kagawa, MS, RD LDN After Hours/Weekend Pager [phone]

## 2015-03-30 ENCOUNTER — Inpatient Hospital Stay (HOSPITAL_COMMUNITY): Payer: Medicare Other

## 2015-03-30 ENCOUNTER — Inpatient Hospital Stay: Payer: Self-pay | Admitting: Family Medicine

## 2015-03-30 DIAGNOSIS — K567 Ileus, unspecified: Secondary | ICD-10-CM

## 2015-03-30 LAB — BASIC METABOLIC PANEL
Anion gap: 10 (ref 5–15)
BUN: 16 mg/dL (ref 6–20)
CALCIUM: 8.5 mg/dL — AB (ref 8.9–10.3)
CO2: 21 mmol/L — ABNORMAL LOW (ref 22–32)
Chloride: 108 mmol/L (ref 101–111)
Creatinine, Ser: 1.68 mg/dL — ABNORMAL HIGH (ref 0.61–1.24)
GFR calc Af Amer: 51 mL/min — ABNORMAL LOW (ref >60)
GFR, EST NON AFRICAN AMERICAN: 44 mL/min — AB (ref >60)
GLUCOSE: 98 mg/dL (ref 65–99)
Potassium: 3.9 mmol/L (ref 3.5–5.1)
Sodium: 139 mmol/L (ref 135–145)

## 2015-03-30 LAB — GLUCOSE, CAPILLARY
GLUCOSE-CAPILLARY: 81 mg/dL (ref 65–99)
GLUCOSE-CAPILLARY: 106 mg/dL — AB (ref 65–99)
Glucose-Capillary: 79 mg/dL (ref 65–99)
Glucose-Capillary: 84 mg/dL (ref 65–99)
Glucose-Capillary: 96 mg/dL (ref 65–99)

## 2015-03-30 LAB — MAGNESIUM: MAGNESIUM: 1.6 mg/dL — AB (ref 1.7–2.4)

## 2015-03-30 MED ORDER — TAMSULOSIN HCL 0.4 MG PO CAPS
0.4000 mg | ORAL_CAPSULE | Freq: Every day | ORAL | Status: DC
  Administered 2015-03-30 – 2015-03-31 (×2): 0.4 mg via ORAL
  Filled 2015-03-30 (×2): qty 1

## 2015-03-30 MED ORDER — FAMOTIDINE 20 MG PO TABS
20.0000 mg | ORAL_TABLET | Freq: Two times a day (BID) | ORAL | Status: DC
  Administered 2015-03-30 – 2015-03-31 (×3): 20 mg via ORAL
  Filled 2015-03-30 (×3): qty 1

## 2015-03-30 MED ORDER — CEPHALEXIN 500 MG PO CAPS
500.0000 mg | ORAL_CAPSULE | Freq: Two times a day (BID) | ORAL | Status: DC
  Administered 2015-03-30 – 2015-03-31 (×2): 500 mg via ORAL
  Filled 2015-03-30: qty 1

## 2015-03-30 NOTE — Consult Note (Signed)
   [facility]   03/30/2015  Richard PaganiniCharles Greggs Jul 28, 1955 [id number]   [facility]. Spoke with brother at bedside who confirms [place] in [place] 10 months out of the [place]. [place]th [place]. Appreciative of the visit. However, it is determined [facility] not appropriate at this time. Inpatient RNCM aware.  Raiford NobleAtika Hall, MSN-Ed, RN,BSN [facility] Care Management Hospital Liaison [phone]

## 2015-03-30 NOTE — Care Management Important Message (Signed)
Important Message  Patient Details IM Letter given to Cookie/Case Manager to present to PatientImportant Message  Patient Details  Name: Richard [name] MRN: [id number] Date of Birth: 28-Apr-1955   Medicare Important Message Given:  Yes    Haskell FlirtJamison, Rockell Faulks 03/30/2015, 11:36 AM Name: Richard PaganiniCharles [name] MRN: [id number] Date of Birth: 28-Apr-1955   Medicare Important Message Given:  Yes    Haskell FlirtJamison, Nichalas Coin 03/30/2015, 11:35 AM

## 2015-03-30 NOTE — Progress Notes (Signed)
PROGRESS NOTE  Richard PaganiniCharles Hensen UJW:[id number] DOB: 10-16-1955 DOA: 03/27/2015 PCP: Nani GasserMETHENEY,CATHERINE, MD  HPI/Recap of past 24 hours:  No bm since yesterday, per family patient seems doing better, less cough  Assessment/Plan: Principal Problem:   HCAP (healthcare-associated pneumonia) Active Problems:   History of seizures   MR (mental retardation)   Renal failure (ARF), acute on chronic (HCC)   Pneumonia  1. Healthcare associated pneumonia - patient has been placed on vancomycin and cefepime for healthcare associated pneumonia.  aspiration during vomiting also possible, swallow eval with compulsive voluntary vomiting but no overt pharyngeal phase abnormality per prelim speech eval, Dg esophagus limited exam due to not able to follow commands consistently, ? Mild lower esophageal dysmotility. Continue abx, leukocytosis resolved, consider narrow abx soon. 2. Abdominal distention with history of recurrent episodes of small bowel obstruction - kub with ileus, no sbo. nothing by mouth since admission, start clear liquid on 12/29, seems tolerating, advance to soft diet 3. Diarrhea - + norovirus, negative C. Difficile. On contact precaution 4. Acute on chronic renal failure - probably from dehydration. Gently hydrate and closely follow intake output and metabolic panel. Hold lisinopril and hydrochlorothiazide secondary to renal failure. Cr improving. 5. Hypertension - holding off lisinopril and hydrochlorothiazide secondary to renal failure. Patient is on when necessary IV hydralazine for systolic blood pressure more than 160. 6. History of seizures - I have requested pharmacy to dose patient's phenobarbital dose IV. 7. Hyperlipidemia on statins. resumed   DVT Prophylaxis Lovenox.  Code Status: Full code.  Family Communication: Patient's brother and sister in law Disposition Plan: home 12/31  Consultants:  none  Procedures:  Dg esophagus  Antibiotics:  vanc/cefepime from  admission to 12/30  Keflex from 12/30   Objective: BP 113/60 mmHg  Pulse 75  Temp(Src) 97.9 F (36.6 C) (Axillary)  Resp 18  Ht 6' (1.829 m)  Wt 180 lb 1.9 oz (81.7 kg)  BMI 24.42 kg/m2  SpO2 100%  Intake/Output Summary (Last 24 hours) at 03/30/15 2020 Last data filed at 03/30/15 1700  Gross per 24 hour  Intake 2321.25 ml  Output   1100 ml  Net 1221.25 ml   Filed Weights   03/27/15 1812 03/28/15 0236 03/30/15 1500  Weight: 185 lb (83.915 kg) 182 lb 8.7 oz (82.8 kg) 180 lb 1.9 oz (81.7 kg)    Exam:   General:  NAD, baseline mental retardation, nonverbal, follow command intermittently with family member's encoragement  Cardiovascular: RRR  Respiratory: CTABL  Abdomen: Soft/ND/NT, positive BS  Musculoskeletal: No Edema  Neuro: baseline mental retardation  Data Reviewed: Basic Metabolic Panel:  Recent Labs Lab 03/27/15 1106 03/27/15 2029 03/28/15 0545 03/29/15 0506 03/30/15 0458  NA 135 131* 134* 140 139  K 5.1 4.3 4.5 4.6 3.9  CL 99 98* 102 108 108  CO2 21 23 22  20* 21*  GLUCOSE 123* 137* 123* 84 98  BUN 22 26* 24* 21* 16  CREATININE 2.27* 2.48* 2.24* 1.99* 1.68*  CALCIUM 9.9 9.4 8.7* 8.9 8.5*  MG  --   --   --   --  1.6*   Liver Function Tests:  Recent Labs Lab 03/28/15 0545  AST 18  ALT 21  ALKPHOS 89  BILITOT 0.5  PROT 6.7  ALBUMIN 3.7   No results for input(s): LIPASE, AMYLASE in the last 168 hours. No results for input(s): AMMONIA in the last 168 hours. CBC:  Recent Labs Lab 03/27/15 2029 03/28/15 0545 03/29/15 0506  WBC 12.8* 17.0* 7.2  NEUTROABS 11.2* 15.4*  --   HGB 15.1 13.4 12.5*  HCT 43.4 39.3 38.0*  MCV 94.6 93.8 96.4  PLT 366 321 298   Cardiac Enzymes:   No results for input(s): CKTOTAL, CKMB, CKMBINDEX, TROPONINI in the last 168 hours. BNP (last 3 results) No results for input(s): BNP in the last 8760 hours.  ProBNP (last 3 results) No results for input(s): PROBNP in the last 8760 hours.  CBG:  Recent  Labs Lab 03/29/15 1651 03/30/15 0004 03/30/15 0553 03/30/15 1223 03/30/15 1648  GLUCAP 71 79 81 106* 84    Recent Results (from the past 240 hour(s))  Urine culture     Status: None   Collection Time: 03/27/15  8:46 PM  Result Value Ref Range Status   Specimen Description URINE, CATHETERIZED  Final   Special Requests NONE  Final   Culture   Final    NO GROWTH 1 DAY Performed at [facility]    Report Status 03/29/2015 FINAL  Final  Culture, blood (routine x 2)     Status: None (Preliminary result)   Collection Time: 03/27/15  9:15 PM  Result Value Ref Range Status   Specimen Description BLOOD LEFT WRIST  Final   Special Requests BOTTLES DRAWN AEROBIC AND ANAEROBIC 5 ML  Final   Culture   Final    NO GROWTH 2 DAYS Performed at [facility]    Report Status PENDING  Incomplete  C difficile quick scan w PCR reflex     Status: None   Collection Time: 03/28/15 10:58 AM  Result Value Ref Range Status   C Diff antigen NEGATIVE NEGATIVE Final   C Diff toxin NEGATIVE NEGATIVE Final   C Diff interpretation Negative for toxigenic C. difficile  Final  Gastrointestinal Panel by PCR , Stool     Status: Abnormal   Collection Time: 03/28/15  3:12 PM  Result Value Ref Range Status   Campylobacter species NOT DETECTED NOT DETECTED Final   Plesimonas shigelloides NOT DETECTED NOT DETECTED Final   Salmonella species NOT DETECTED NOT DETECTED Final   Yersinia enterocolitica NOT DETECTED NOT DETECTED Final   Vibrio species NOT DETECTED NOT DETECTED Final   Vibrio cholerae NOT DETECTED NOT DETECTED Final   Enteroaggregative E coli (EAEC) NOT DETECTED NOT DETECTED Final   Enteropathogenic E coli (EPEC) NOT DETECTED NOT DETECTED Final   Enterotoxigenic E coli (ETEC) NOT DETECTED NOT DETECTED Final   Shiga like toxin producing E coli (STEC) NOT DETECTED NOT DETECTED Final   E. coli O157 NOT DETECTED NOT DETECTED Final   Shigella/Enteroinvasive E coli (EIEC) NOT DETECTED NOT  DETECTED Final   Cryptosporidium NOT DETECTED NOT DETECTED Final   Cyclospora cayetanensis NOT DETECTED NOT DETECTED Final   Entamoeba histolytica NOT DETECTED NOT DETECTED Final   Giardia lamblia NOT DETECTED NOT DETECTED Final   Adenovirus F40/41 NOT DETECTED NOT DETECTED Final   Astrovirus NOT DETECTED NOT DETECTED Final   Norovirus GI/GII DETECTED (A) NOT DETECTED Corrected    Comment: CRITICAL RESULT CALLED TO, READ BACK BY AND VERIFIED WITH: SHANNON POTEAT 03/29/15 1520 TCH CORRECTED ON 12/29 AT 1634: PREVIOUSLY REPORTED AS CRITICAL RESULT CALLED TO, READ BACK BY AND VERIFIED WITH: Shamnon Poteat @ [facility] 03/29/15  RESULT CALLED TO, READ BACK BY AND VERIFIED WITH: GURLEY,K @ 1420 ON 122916 BY POTEAT,S    Rotavirus A NOT DETECTED NOT DETECTED Final   Sapovirus (I, II, IV, and V) NOT DETECTED NOT DETECTED Final  MRSA PCR Screening     Status: None   Collection Time: 03/29/15  4:57 PM  Result Value Ref Range Status   MRSA by PCR NEGATIVE NEGATIVE Final    Comment:        The GeneXpert MRSA Assay (FDA approved for NASAL specimens only), is one component of a comprehensive MRSA colonization surveillance program. It is not intended to diagnose MRSA infection nor to guide or monitor treatment for MRSA infections.      Studies: Dg Abd 1 View  03/30/2015  CLINICAL DATA:  59 year old male with a history of an ileus EXAM: ABDOMEN - 1 VIEW COMPARISON:  Plain film 03/29/2015, CT 11/07/2013 FINDINGS: Single frontal view with exclusion of the superior abdomen. Gaseous distention of small bowel loops persists, with gas and formed stool now within the colon. There has been progression of enteric contrast, which now reaches the rectum. Persistent soft tissue density projecting over the sacrum, compatible with partially distended urinary bladder. No displaced fracture. IMPRESSION: Persistent gaseous distention of small bowel loops, with progression of enteric contrast to the  rectum, compatible with ileus or partial obstruction. Continued plain film surveillance is recommended given the abnormal bowel gas pattern. Soft tissue density projecting over the pelvis, compatible with partially distended urinary bladder Signed, Yvone Neu. Loreta Ave, DO [facility] Electronically Signed   By: Gilmer Mor D.O.   On: 03/30/2015 09:26    Scheduled Meds: . antiseptic oral rinse  7 mL Mouth Rinse q12n4p  . chlorhexidine  15 mL Mouth Rinse BID  . enoxaparin (LOVENOX) injection  40 mg Subcutaneous Q24H  . famotidine  20 mg Oral BID  . PHENObarbital  32.5 mg Intravenous Daily  . PHENObarbital  65 mg Intravenous QHS  . tamsulosin  0.4 mg Oral Daily    Continuous Infusions:     Time spent:  Keller Bounds MD, PhD  [facility] Pager [phone]. If 7PM-7AM, please contact night-coverage at [email or web address], password [facility] 03/30/2015, 8:20 PM  LOS: 3 days

## 2015-03-30 NOTE — Progress Notes (Addendum)
Speech Language Pathology Treatment: Dysphagia  Patient Details Name: Richard [name] MRN: [id number] DOB: May 11, 1955 Today's Date: 03/30/2015 Time: 4098-1191 SLP Time Calculation (min) (ACUTE ONLY): 35 min  Assessment / Plan / Recommendation Clinical Impression  Pt seen with pt's brother Richard [name].  Observed pt to consume his medications  - one at a time - given with water.   Good tolerance of intake noted with no indications of aspiration/airway compromise.  Richard [name] confirms information that Richard [name] provided yesterday re: pt coughing primarily after eating.  Coughing episodes occuring more quickly when pt lays down soon after meals.  Given findings of esophageal dysmotility on barium swallow, provided Richard [name] with mitigation strategies to decrease symptoms - advising several small meals, small bites/sips, chewing well, drinking liquids during meals and strict reflux precautions. Further advised that warm or room temperature drinks may be better tolerated than cold.  Richard [name] reports he has been trying to follow some of these precautionary measures.  Suspect pt's aspiration due to vomiting issue recently but dysmotility predisposes him to aspiration risk.   Note pt now + for norovirus per chart review. All education completed and SLP to sign off.  Thanks for allowing me to help with this pt's care plan and with this most dutiful family.    HPI HPI: 59 yo male adm to [facility] with h/o MR, autism, recent aspiration pna likely due to emesis aspiration, SBOs, and seizures.   Swallow evaluation was ordered by Md.  CXR showed left lower lobe ATX or infiltrate.  Possible ileus or adynamic colon noted per imaging study- Abd DG showed not definitive obstruction.  Sister in law Richard [name] reports pt coughs mostly AFTER intake x 1 1/2 years that has worsened recently.  She does not know for certain if dysphagia symptoms worsen during bowel deficit exacerbations.  Besides pt's recent pna, sister in law does not recall other pna  episodes and reports pt generally has a good appetite.  Daughter in law reports pt recently was given a reflux medication but did not observe improvement in cough on said medication.        SLP Plan  All goals met     Recommendations  Diet recommendations: Dysphagia 3 (mechanical soft);Thin liquid Liquids provided via: Cup;Straw Medication Administration: Whole meds with liquid Supervision: Full supervision/cueing for compensatory strategies Postural Changes and/or Swallow Maneuvers: Seated upright 90 degrees;Upright 30-60 min after meal (drink liquids t/o meal as able)              Oral Care Recommendations: Oral care BID Follow up Recommendations: None Plan: All goals met   Luanna Salk, [place] [facility] SLP [phone]

## 2015-03-31 LAB — GLUCOSE, CAPILLARY
GLUCOSE-CAPILLARY: 90 mg/dL (ref 65–99)
Glucose-Capillary: 83 mg/dL (ref 65–99)

## 2015-03-31 LAB — BASIC METABOLIC PANEL
ANION GAP: 10 (ref 5–15)
BUN: 10 mg/dL (ref 6–20)
CHLORIDE: 108 mmol/L (ref 101–111)
CO2: 23 mmol/L (ref 22–32)
Calcium: 8.6 mg/dL — ABNORMAL LOW (ref 8.9–10.3)
Creatinine, Ser: 1.64 mg/dL — ABNORMAL HIGH (ref 0.61–1.24)
GFR calc non Af Amer: 45 mL/min — ABNORMAL LOW (ref >60)
GFR, EST AFRICAN AMERICAN: 52 mL/min — AB (ref >60)
GLUCOSE: 97 mg/dL (ref 65–99)
POTASSIUM: 4.2 mmol/L (ref 3.5–5.1)
Sodium: 141 mmol/L (ref 135–145)

## 2015-03-31 LAB — MAGNESIUM: Magnesium: 1.5 mg/dL — ABNORMAL LOW (ref 1.7–2.4)

## 2015-03-31 MED ORDER — MAGNESIUM SULFATE 2 GM/50ML IV SOLN
2.0000 g | Freq: Once | INTRAVENOUS | Status: AC
  Administered 2015-03-31: 2 g via INTRAVENOUS
  Filled 2015-03-31: qty 50

## 2015-03-31 NOTE — Progress Notes (Signed)
PT Cancellation Note  Patient Details Name: Rise PaganiniCharles Miera MRN: [id number] DOB: April 22, 1955   Cancelled Treatment:    Reason Eval/Treat Not Completed: PT screened, no needs identified, will sign off. Spoke with family-pt is mobilizing without difficulty. Pt walked with nursing this am. Family denies need for PT. Will sign off. Thanks.    Rebeca AlertJannie Yochanan Eddleman, MPT Pager: [phone]

## 2015-03-31 NOTE — Discharge Summary (Addendum)
Discharge Summary  Richard PaganiniCharles [name] NFA:[id number] DOB: December 21, 1955  PCP: Richard GasserMETHENEY,CATHERINE, MD  Admit date: 03/27/2015 Discharge date: 03/31/2015  Time spent: <2130mins  Recommendations for Outpatient Follow-up:  1. F/u with PMD within a week for hospital discharge follow up, repeat cbc/bmp at follow up  Discharge Diagnoses:  Active Hospital Problems   Diagnosis Date Noted  . HCAP (healthcare-associated pneumonia) 03/27/2015  . Renal failure (ARF), acute on chronic (HCC) 03/28/2015  . Pneumonia 03/28/2015  . MR (mental retardation) 09/03/2012  . History of seizures 09/03/2012    Resolved Hospital Problems   Diagnosis Date Noted Date Resolved  No resolved problems to display.    Discharge Condition: stable  Diet recommendation: heart healthy,  Diet recommendations: Dysphagia 3 (mechanical soft);Thin liquid Liquids provided via: Cup;Straw Medication Administration: Whole meds with liquid Supervision: Full supervision/cueing for compensatory strategies Postural Changes and/or Swallow Maneuvers: Seated upright 90 degrees;Upright 30-60 min after meal (drink liquids t/o meal as able)  [employer]   03/27/15 1812 03/28/15 0236 03/30/15 1500  Weight: 185 lb (83.915 kg) 182 lb 8.7 oz (82.8 kg) 180 lb 1.9 oz (81.7 kg)    History of present illness:  Richard [name] is a 59 y.o. male with history of recurrent small bowel obstruction [facility] for bowel obstruction and was managed conservatively has been having persistent cough over the last 10 days since discharge. Patient is on empiric antibiotics for possible aspiration. Patient also was recently started on Pepcid for aspiration despite which patient is still having cough. In the ER patient was found be febrile with leukocytosis and chest x-ray shows possible infiltrate. On exam patient also has distended abdomen and chest x-ray was showing possible adynamic ileus. Patient in the ER did have  one episode of nausea vomiting. Patient did have diarrhea every day at least one episode. At this time patient had been admitted for pneumonia with further workup for distended abdomen.    Hospital Course:  Principal Problem:   HCAP (healthcare-associated pneumonia) Active Problems:   History of seizures   MR (mental retardation)   Renal failure (ARF), acute on chronic (HCC)   Pneumonia   Healthcare associated pneumonia - treated with vancomycin and cefepime for healthcare associated pneumonia since admission, this was topped on 12/30 after mrsa sceen negative, leukocytosis resolved and gi panel with +norovirus which likely the cause of fever and n/v. . aspiration during vomiting also possible, swallow eval with compulsive voluntary vomiting but no overt pharyngeal phase abnormality per speech eval, DG esophagus limited exam due to not able to follow commands consistently, ? Mild lower esophageal dysmotility. Patient is discharge with oral abx to finish 7 days treatment.  Abdominal distention with history of recurrent episodes of small bowel obstruction - kub with ileus, no sbo. nothing by mouth since admission, start clear liquid on 12/29, tolerated diet advancement, no n/v , no ab pain, had bm.  Diarrhea - + norovirus, negative C. Difficile. On contact precaution in the hospital, symptom resolved.  Acute on chronic renal failure - probably from dehydration. Gently hydrate and closely follow intake output and metabolic panel. Hold lisinopril and hydrochlorothiazide secondary to renal failure. Cr back to baseline at discharge, home meds lisinopril/hctz restarted, close f/u with pmd and nephrologist for meds adjustment and renal function monitor.  Hypertension -  lisinopril and hydrochlorothiazide held in the hospital secondary to renal failure, restarted at discharge.  History of seizures - pharmacy to dose patient's phenobarbital dose IV due to npo  status, restarted oral meds at  discharge.  Hyperlipidemia on statins. Resumed  Hypomagnesemia: mag replaced.    Code Status: Full code.  Family Communication: Patient's brother and sister in law Disposition Plan: home 12/31  Consultants:  none  Procedures:  Dg esophagus  Antibiotics:  vanc/cefepime from admission to 12/30  Keflex from 12/30  Discharge Exam: BP 107/61 mmHg  Pulse 61  Temp(Src) 97.7 F (36.5 C) (Oral)  Resp 18  Ht 6' (1.829 m)  Wt 180 lb 1.9 oz (81.7 kg)  BMI 24.42 kg/m2  SpO2 98%   General: NAD, baseline mental retardation, nonverbal, follow command intermittently with family member's encoragement  Cardiovascular: RRR  Respiratory: CTABL  Abdomen: Soft/ND/NT, positive BS  Musculoskeletal: No Edema  Neuro: baseline mental retardation  Discharge Instructions You were cared for by a hospitalist during your hospital stay. If you have any questions about your discharge medications or the care you received while you were in the hospital after you are discharged, you can call the unit and asked to speak with the hospitalist on call if the hospitalist that took care of you is not available. Once you are discharged, your primary care physician will handle any further medical issues. Please note that NO REFILLS for any discharge medications will be authorized once you are discharged, as it is imperative that you return to your primary care physician (or establish a relationship with a primary care physician if you do not have one) for your aftercare needs so that they can reassess your need for medications and monitor your lab values.  Discharge Instructions    Diet - low sodium heart healthy    Complete by:  As directed      Increase activity slowly    Complete by:  As directed             Medication List    TAKE these medications        Calcium Carbonate-Vitamin D 600-400 MG-UNIT tablet  Take 1 tablet by mouth 2 (two) times daily.     cefdinir 300 MG capsule  Commonly  known as:  OMNICEF  Take 300 mg by mouth every 12 (twelve) hours. ABT Start Date 03/21/15 & End Date 03/30/15.     cholecalciferol 1000 units tablet  Commonly known as:  VITAMIN D  Take 2,000 Units by mouth daily.     docusate sodium 100 MG capsule  Commonly known as:  COLACE  Take 1 capsule (100 mg total) by mouth 2 (two) times daily. Patient is given this medication to help with constipation.     famotidine 20 MG tablet  Commonly known as:  PEPCID  Take 20 mg by mouth 2 (two) times daily.     lisinopril-hydrochlorothiazide 10-12.5 MG tablet  Commonly known as:  PRINZIDE,ZESTORETIC  Take 0.5 tablets by mouth daily. Taking 1/2 tab     multivitamin with minerals tablet  Take 1 tablet by mouth daily.     PHENobarbital 32.4 MG tablet  Commonly known as:  LUMINAL  Take 32.4-64.8 mg by mouth 2 (two) times daily. Take 1 tablet (32.4 mg) at noon and Take 2 tablets (64.8 mg) at bedtime.     polyethylene glycol packet  Commonly known as:  MIRALAX / GLYCOLAX  Take 17 g by mouth every other day.     simvastatin 40 MG tablet  Commonly known as:  ZOCOR  Take 40 mg by mouth every evening.     SUMAtriptan 100 MG tablet  Commonly known as:  IMITREX  Take 1 tablet (100 mg total) by mouth once. May repeat in 2 hours if headache persists or recurs.     tamsulosin 0.4 MG Caps capsule  Commonly known as:  FLOMAX  Take 1 capsule (0.4 mg total) by mouth daily.     traZODone 100 MG tablet  Commonly known as:  DESYREL  Take 100 mg by mouth at bedtime as needed for sleep.       Allergies  Allergen Reactions  . Nsaids Other (See Comments)    Only one kidney.   . Ofloxacin     Unsure   . Phenothiazines Other (See Comments)    Unknown       Follow-up Information    Follow up with METHENEY,CATHERINE, MD In 1 week.   Specialty:  Family Medicine   Why:  hospital discharge follow up, repeat cbc/bmp at follow up   Contact information:   [address] [place]  [address] [phone]       Follow up with patient also follows [facility] internal medicine, [address], [place], [place] phone [phone], fax [phone]..      Follow up with please follow up with urology in [place] for urinary retention. .       The results of significant diagnostics from this hospitalization (including imaging, microbiology, ancillary and laboratory) are listed below for reference.    Significant Diagnostic Studies: Dg Chest 2 View  03/27/2015  CLINICAL DATA:  Aspiration pneumonia. EXAM: CHEST  2 VIEW COMPARISON:  11/06/2013. FINDINGS: Patient rotated to the right. Mediastinum and hilar structures are normal. Left base atelectasis and or infiltrate. No pleural effusion pneumothorax. Cardiomegaly again noted. No pulmonary venous congestion . No acute bony abnormality. Dilated loops of probable small and large bowel noted suggesting adynamic ileus. Abdominal series can be obtained for further evaluation. IMPRESSION: 1.  Left lower lobe atelectasis and/or infiltrate. 2.  Cardiomegaly. 3. Possible adynamic ileus. Abdominal series can be obtained for further evaluation . Electronically Signed   By: Maisie Fus  Register   On: 03/27/2015 11:35   Dg Abd 1 View  03/30/2015  CLINICAL DATA:  59 year old male with a history of an ileus EXAM: ABDOMEN - 1 VIEW COMPARISON:  Plain film 03/29/2015, CT 11/07/2013 FINDINGS: Single frontal view with exclusion of the superior abdomen. Gaseous distention of small bowel loops persists, with gas and formed stool now within the colon. There has been progression of enteric contrast, which now reaches the rectum. Persistent soft tissue density projecting over the sacrum, compatible with partially distended urinary bladder. No displaced fracture. IMPRESSION: Persistent gaseous distention of small bowel loops, with progression of enteric contrast to the rectum, compatible with ileus or partial obstruction. Continued plain film surveillance is recommended given  the abnormal bowel gas pattern. Soft tissue density projecting over the pelvis, compatible with partially distended urinary bladder Signed, Yvone Neu. Loreta Ave, DO [facility] Electronically Signed   By: Gilmer Mor D.O.   On: 03/30/2015 09:26   Dg Abd 1 View  03/29/2015  CLINICAL DATA:  Persistent diarrhea.  No obvious pain.  No fever. EXAM: ABDOMEN - 1 VIEW COMPARISON:  03/28/2015 FINDINGS: There is increased bowel gas diffusely, with mild bowel dilation. Bowel dilation mildly improved when compared to the previous day's study. No convincing obstruction. Masslike opacity arises from the pelvis likely a distended bladder. Soft tissues are otherwise unremarkable. IMPRESSION: 1. Increased bowel gas with mild bowel distention. Findings  may reflect a mild diffuse adynamic ileus. There has been mild decrease in bowel distention when compared to the prior exam. No evidence of bowel obstruction. 2. Distended bladder. Electronically Signed   By: Amie Portland M.D.   On: 03/29/2015 12:16   Dg Abd 1 View  03/28/2015  CLINICAL DATA:  Acute onset of generalized abdominal distention. Initial encounter. EXAM: ABDOMEN - 1 VIEW COMPARISON:  Abdominal radiograph performed 11/07/2013 FINDINGS: There is distention of small-bowel loops to 5.7 cm in maximal diameter, though air filled loops of colon are also seen. This suggests some degree of ileus. No significant stool is seen. No free intra-abdominal air is identified, though evaluation for free air is limited on supine views. The visualized osseous structures are within normal limits; the sacroiliac joints are unremarkable in appearance. IMPRESSION: Distention of small-bowel loops to 5.7 cm in maximal diameter, though air filled loops of colon are also seen. This suggests some degree of ileus. No definite evidence of bowel obstruction. Electronically Signed   By: Roanna Raider M.D.   On: 03/28/2015 05:33   Dg  Esophagus  03/29/2015  CLINICAL DATA:  Vomiting EXAM: ESOPHOGRAM/BARIUM SWALLOW TECHNIQUE: Single contrast examination was performed using  thin barium. FLUOROSCOPY TIME:  Radiation Exposure Index (as provided by the fluoroscopic device): 9.1 d Gy cm2 COMPARISON:  None. FINDINGS: Due to difficulty with following commands, limited swallows were obtained in the supine position only. Contrast was administered via oral syringe. Mild lower esophageal dysmotility. No fixed esophageal narrowing or stricture. Patient could not be assessed for gastroesophageal reflux. IMPRESSION: Limited evaluation, as described above. Mild lower esophageal dysmotility. No fixed esophageal narrowing or stricture. Electronically Signed   By: Charline Bills M.D.   On: 03/29/2015 13:20    Microbiology: Recent Results (from the past 240 hour(s))  Urine culture     Status: None   Collection Time: 03/27/15  8:46 PM  Result Value Ref Range Status   Specimen Description URINE, CATHETERIZED  Final   Special Requests NONE  Final   Culture   Final    NO GROWTH 1 DAY Performed at [facility]    Report Status 03/29/2015 FINAL  Final  Culture, blood (routine x 2)     Status: None (Preliminary result)   Collection Time: 03/27/15  9:15 PM  Result Value Ref Range Status   Specimen Description BLOOD LEFT WRIST  Final   Special Requests BOTTLES DRAWN AEROBIC AND ANAEROBIC 5 ML  Final   Culture   Final    NO GROWTH 3 DAYS Performed at [facility]    Report Status PENDING  Incomplete  C difficile quick scan w PCR reflex     Status: None   Collection Time: 03/28/15 10:58 AM  Result Value Ref Range Status   C Diff antigen NEGATIVE NEGATIVE Final   C Diff toxin NEGATIVE NEGATIVE Final   C Diff interpretation Negative for toxigenic C. difficile  Final  Gastrointestinal Panel by PCR , Stool     Status: Abnormal   Collection Time: 03/28/15  3:12 PM  Result Value Ref Range Status   Campylobacter species NOT  DETECTED NOT DETECTED Final   Plesimonas shigelloides NOT DETECTED NOT DETECTED Final   Salmonella species NOT DETECTED NOT DETECTED Final   Yersinia enterocolitica NOT DETECTED NOT DETECTED Final   Vibrio species NOT DETECTED NOT DETECTED Final   Vibrio cholerae NOT DETECTED NOT DETECTED Final   Enteroaggregative E coli (EAEC) NOT DETECTED NOT DETECTED Final  Enteropathogenic E coli (EPEC) NOT DETECTED NOT DETECTED Final   Enterotoxigenic E coli (ETEC) NOT DETECTED NOT DETECTED Final   Shiga like toxin producing E coli (STEC) NOT DETECTED NOT DETECTED Final   E. coli O157 NOT DETECTED NOT DETECTED Final   Shigella/Enteroinvasive E coli (EIEC) NOT DETECTED NOT DETECTED Final   Cryptosporidium NOT DETECTED NOT DETECTED Final   Cyclospora cayetanensis NOT DETECTED NOT DETECTED Final   Entamoeba histolytica NOT DETECTED NOT DETECTED Final   Giardia lamblia NOT DETECTED NOT DETECTED Final   Adenovirus F40/41 NOT DETECTED NOT DETECTED Final   Astrovirus NOT DETECTED NOT DETECTED Final   Norovirus GI/GII DETECTED (A) NOT DETECTED Corrected    Comment: CRITICAL RESULT CALLED TO, READ BACK BY AND VERIFIED WITH: SHANNON POTEAT 03/29/15 1520 [facility] CORRECTED ON 12/29 AT 1634: PREVIOUSLY REPORTED AS CRITICAL RESULT CALLED TO, READ BACK BY AND VERIFIED WITH: Shamnon Poteat @ [facility] 03/29/15  RESULT CALLED TO, READ BACK BY AND VERIFIED WITH: GURLEY,K @ 1420 ON 122916 BY POTEAT,S    Rotavirus A NOT DETECTED NOT DETECTED Final   Sapovirus (I, II, IV, and V) NOT DETECTED NOT DETECTED Final  MRSA PCR Screening     Status: None   Collection Time: 03/29/15  4:57 PM  Result Value Ref Range Status   MRSA by PCR NEGATIVE NEGATIVE Final    Comment:        The GeneXpert MRSA Assay (FDA approved for NASAL specimens only), is one component of a comprehensive MRSA colonization surveillance program. It is not intended to diagnose MRSA infection nor to guide or monitor treatment for MRSA  infections.      Labs: Basic Metabolic Panel:  Recent Labs Lab 03/27/15 2029 03/28/15 0545 03/29/15 0506 03/30/15 0458 03/31/15 0613  NA 131* 134* 140 139 141  K 4.3 4.5 4.6 3.9 4.2  CL 98* 102 108 108 108  CO2 23 22 20* 21* 23  GLUCOSE 137* 123* 84 98 97  BUN 26* 24* 21* 16 10  CREATININE 2.48* 2.24* 1.99* 1.68* 1.64*  CALCIUM 9.4 8.7* 8.9 8.5* 8.6*  MG  --   --   --  1.6* 1.5*   Liver Function Tests:  Recent Labs Lab 03/28/15 0545  AST 18  ALT 21  ALKPHOS 89  BILITOT 0.5  PROT 6.7  ALBUMIN 3.7   No results for input(s): LIPASE, AMYLASE in the last 168 hours. No results for input(s): AMMONIA in the last 168 hours. CBC:  Recent Labs Lab 03/27/15 2029 03/28/15 0545 03/29/15 0506  WBC 12.8* 17.0* 7.2  NEUTROABS 11.2* 15.4*  --   HGB 15.1 13.4 12.5*  HCT 43.4 39.3 38.0*  MCV 94.6 93.8 96.4  PLT 366 321 298   Cardiac Enzymes: No results for input(s): CKTOTAL, CKMB, CKMBINDEX, TROPONINI in the last 168 hours. BNP: BNP (last 3 results) No results for input(s): BNP in the last 8760 hours.  ProBNP (last 3 results) No results for input(s): PROBNP in the last 8760 hours.  CBG:  Recent Labs Lab 03/30/15 1223 03/30/15 1648 03/30/15 2132 03/31/15 0744 03/31/15 1147  GLUCAP 106* 84 96 83 90       Signed:  Demarcus Thielke MD, PhD  [facility] 03/31/2015, 12:57 PM

## 2015-04-02 LAB — CULTURE, BLOOD (ROUTINE X 2): Culture: NO GROWTH

## 2015-04-05 ENCOUNTER — Ambulatory Visit (INDEPENDENT_AMBULATORY_CARE_PROVIDER_SITE_OTHER): Payer: Medicare Other | Admitting: Family Medicine

## 2015-04-05 ENCOUNTER — Encounter: Payer: Self-pay | Admitting: Family Medicine

## 2015-04-05 VITALS — BP 123/71 | HR 73 | Ht 72.0 in | Wt 178.0 lb

## 2015-04-05 DIAGNOSIS — Y95 Nosocomial condition: Principal | ICD-10-CM

## 2015-04-05 DIAGNOSIS — J189 Pneumonia, unspecified organism: Secondary | ICD-10-CM | POA: Diagnosis not present

## 2015-04-05 DIAGNOSIS — A09 Infectious gastroenteritis and colitis, unspecified: Secondary | ICD-10-CM

## 2015-04-05 LAB — CBC WITH DIFFERENTIAL/PLATELET
BASOS ABS: 0 10*3/uL (ref 0.0–0.1)
Basophils Relative: 0 % (ref 0–1)
EOS PCT: 2 % (ref 0–5)
Eosinophils Absolute: 0.1 10*3/uL (ref 0.0–0.7)
HEMATOCRIT: 38.2 % — AB (ref 39.0–52.0)
HEMOGLOBIN: 13 g/dL (ref 13.0–17.0)
LYMPHS ABS: 1.4 10*3/uL (ref 0.7–4.0)
LYMPHS PCT: 27 % (ref 12–46)
MCH: 32.3 pg (ref 26.0–34.0)
MCHC: 34 g/dL (ref 30.0–36.0)
MCV: 95 fL (ref 78.0–100.0)
MONO ABS: 0.7 10*3/uL (ref 0.1–1.0)
MPV: 10.5 fL (ref 8.6–12.4)
Monocytes Relative: 13 % — ABNORMAL HIGH (ref 3–12)
NEUTROS ABS: 3 10*3/uL (ref 1.7–7.7)
Neutrophils Relative %: 58 % (ref 43–77)
Platelets: 357 10*3/uL (ref 150–400)
RBC: 4.02 MIL/uL — AB (ref 4.22–5.81)
RDW: 13.9 % (ref 11.5–15.5)
WBC: 5.1 10*3/uL (ref 4.0–10.5)

## 2015-04-05 LAB — COMPLETE METABOLIC PANEL WITH GFR
ALBUMIN: 3.9 g/dL (ref 3.6–5.1)
ALK PHOS: 77 U/L (ref 40–115)
ALT: 23 U/L (ref 9–46)
AST: 17 U/L (ref 10–35)
BUN: 15 mg/dL (ref 7–25)
CALCIUM: 9.3 mg/dL (ref 8.6–10.3)
CO2: 25 mmol/L (ref 20–31)
CREATININE: 1.74 mg/dL — AB (ref 0.70–1.33)
Chloride: 101 mmol/L (ref 98–110)
GFR, EST AFRICAN AMERICAN: 49 mL/min — AB (ref >=60)
GFR, Est Non African American: 43 mL/min — ABNORMAL LOW (ref >=60)
Glucose, Bld: 65 mg/dL (ref 65–99)
Potassium: 4.2 mmol/L (ref 3.5–5.3)
Sodium: 136 mmol/L (ref 135–146)
Total Bilirubin: 0.3 mg/dL (ref 0.2–1.2)
Total Protein: 6.4 g/dL (ref 6.1–8.1)

## 2015-04-05 LAB — MAGNESIUM: Magnesium: 1.6 mg/dL (ref 1.5–2.5)

## 2015-04-05 NOTE — Progress Notes (Signed)
   Subjective:    Patient ID: Richard [name], male    DOB: [birth year], 58 y.o.   MRN: [id number]  HPI  60 year old male with mental retardation comes in today for hospital follow-up for community-acquired pneumonia. He was initially admitted to hospital and then discharged for release. he came in for follow-up severe and was not better. he was also started to develop acute renal failure from diarrhea and dehydration. he is actually had diarrhea for almost 4 weeks at this point. [facility] [place]. he was diagnosed with bilateral healthcare associated pneumonia and acute on chronic renal failure. he was also diagnosed with nor a virus. he had a positive pcr.   His sister who is his primary caretaker is concerned that he is continuing to have diarrhea. She said last week it was more lava like that now it is more watery with granules.  No fevers chills or sweats. He finished his antibiotic about 2-3 days ago. Unfortunately has had 2 accidents where he was unable to make it to the bathroom before having a bowel movement.  He was also diagnosed with low magnesium. He was given some replacement while in the hospital.   Review of Systems     Objective:   Physical Exam  Constitutional: He is oriented to person, place, and time. He appears well-developed and well-nourished.  HENT:  Head: Normocephalic and atraumatic.  Right Ear: External ear normal.  Left Ear: External ear normal.  Nose: Nose normal.  Mouth/Throat: Oropharynx is clear and moist.  Canals blocked by cerumen.   Eyes: Conjunctivae and EOM are normal. Pupils are equal, round, and reactive to light.  Neck: Neck supple. No thyromegaly present.  Cardiovascular: Normal rate and normal heart sounds.   Pulmonary/Chest: Effort normal and breath sounds normal.  Abdominal: Soft. Bowel sounds are normal. He exhibits no distension and no mass. There is no tenderness. There is no rebound and no guarding.   Slightly more firm in the RLQ.    Lymphadenopathy:    He has no cervical adenopathy.  Neurological: He is alert and oriented to person, place, and time.  Skin: Skin is warm and dry.  Psychiatric: He has a normal mood and affect. His behavior is normal.          Assessment & Plan:  Healthcare associated pneumonia-he completed his oral antibiotics about 2 days ago and is doing well. He still has a cough. Note he normally does have a chronic cough associated after eating and drinking. They did attempt to do a swallowing evaluation while there but unfortunately he was not able to follow instructions well without his primary caretakers there to help assist in communicating with him. He has been on an H2 blocker for 2 weeks without any improvement in symptoms. I would like to see him back in about 3 weeks is to recheck his lungs to make sure that he is completely getting over this acute illness. Also work on taking deep breaths and trying to get more exercise to help open up the lungs.  Acute on chronic renal failure-recheck creatinine today.  Hypo-magnesium-recheck magnesium level. Hypertension-well controlled on Lexapro and hydrochlorothiazide. He is back on these medications. They were held during his hospitalization.  Diarrhea-unfortunately it seems to be getting worse and is now more watery. Will retest for C. difficile as he has been on multiple rounds of antibiotics.

## 2015-04-06 ENCOUNTER — Other Ambulatory Visit: Payer: Self-pay | Admitting: Family Medicine

## 2015-04-06 DIAGNOSIS — A09 Infectious gastroenteritis and colitis, unspecified: Secondary | ICD-10-CM | POA: Diagnosis not present

## 2015-04-07 LAB — STOOL CELLS, WBC & RBC
RBC/40X FIELD:: 0
WBC/40X FIELD (SOL): 0

## 2015-04-09 ENCOUNTER — Telehealth: Payer: Self-pay

## 2015-04-09 LAB — C. DIFFICILE GDH AND TOXIN A/B
C. difficile GDH: NOT DETECTED
C. difficile Toxin A/B: NOT DETECTED
SOURCE (C DIFF GDH AND TOXIN A/B): 0

## 2015-04-09 MED ORDER — METRONIDAZOLE 500 MG PO TABS: 500.0000 mg | ORAL_TABLET | Freq: Two times a day (BID) | ORAL | Status: DC

## 2015-04-09 NOTE — Telephone Encounter (Signed)
Patient's wife advised[facility]

## 2015-04-09 NOTE — Telephone Encounter (Signed)
We may need to call the lab. I see with a preliminary on the stool cultures negative but I don't see anything pending on the C. difficile assay. We definitely need to get that tested. I don't know if we need to re-[place]ol[place]. It just looks like it wasn't performed. We'll go ahead and put him on antibiotic called metronidazole. Prescription sent to [facility].

## 2015-04-09 NOTE — Telephone Encounter (Signed)
Richard [name] has had an increase in diarrhea, 3-4 daily, with some blood. He has a color change in his stool. Now his stool is a tan color. Not sure if he has a fever. Denies chills and sweats.

## 2015-04-10 ENCOUNTER — Other Ambulatory Visit: Payer: Self-pay | Admitting: *Deleted

## 2015-04-10 DIAGNOSIS — R197 Diarrhea, unspecified: Secondary | ICD-10-CM

## 2015-04-10 LAB — STOOL CULTURE

## 2015-04-27 DIAGNOSIS — R339 Retention of urine, unspecified: Secondary | ICD-10-CM | POA: Diagnosis not present

## 2015-04-27 DIAGNOSIS — J189 Pneumonia, unspecified organism: Secondary | ICD-10-CM | POA: Diagnosis not present

## 2015-04-27 DIAGNOSIS — N189 Chronic kidney disease, unspecified: Secondary | ICD-10-CM | POA: Diagnosis not present

## 2015-04-27 DIAGNOSIS — K5660 Unspecified intestinal obstruction: Secondary | ICD-10-CM | POA: Diagnosis not present

## 2015-05-01 DIAGNOSIS — K5660 Unspecified intestinal obstruction: Secondary | ICD-10-CM | POA: Diagnosis not present

## 2015-05-01 DIAGNOSIS — N189 Chronic kidney disease, unspecified: Secondary | ICD-10-CM | POA: Diagnosis not present

## 2015-05-01 DIAGNOSIS — J189 Pneumonia, unspecified organism: Secondary | ICD-10-CM | POA: Diagnosis not present

## 2015-05-08 DIAGNOSIS — G40909 Epilepsy, unspecified, not intractable, without status epilepticus: Secondary | ICD-10-CM | POA: Diagnosis not present

## 2015-05-08 DIAGNOSIS — N189 Chronic kidney disease, unspecified: Secondary | ICD-10-CM | POA: Diagnosis not present

## 2015-05-08 IMAGING — CR DG ABDOMEN 2V
3 series · 3 of 3 positions shown · non-contrast
Comparison: 02/23/2013; CT abdomen pelvis -02/24/2013

CLINICAL DATA: Evaluate abdominal distention

EXAM:
ABDOMEN - 2 VIEW

[w abdomen upright *]
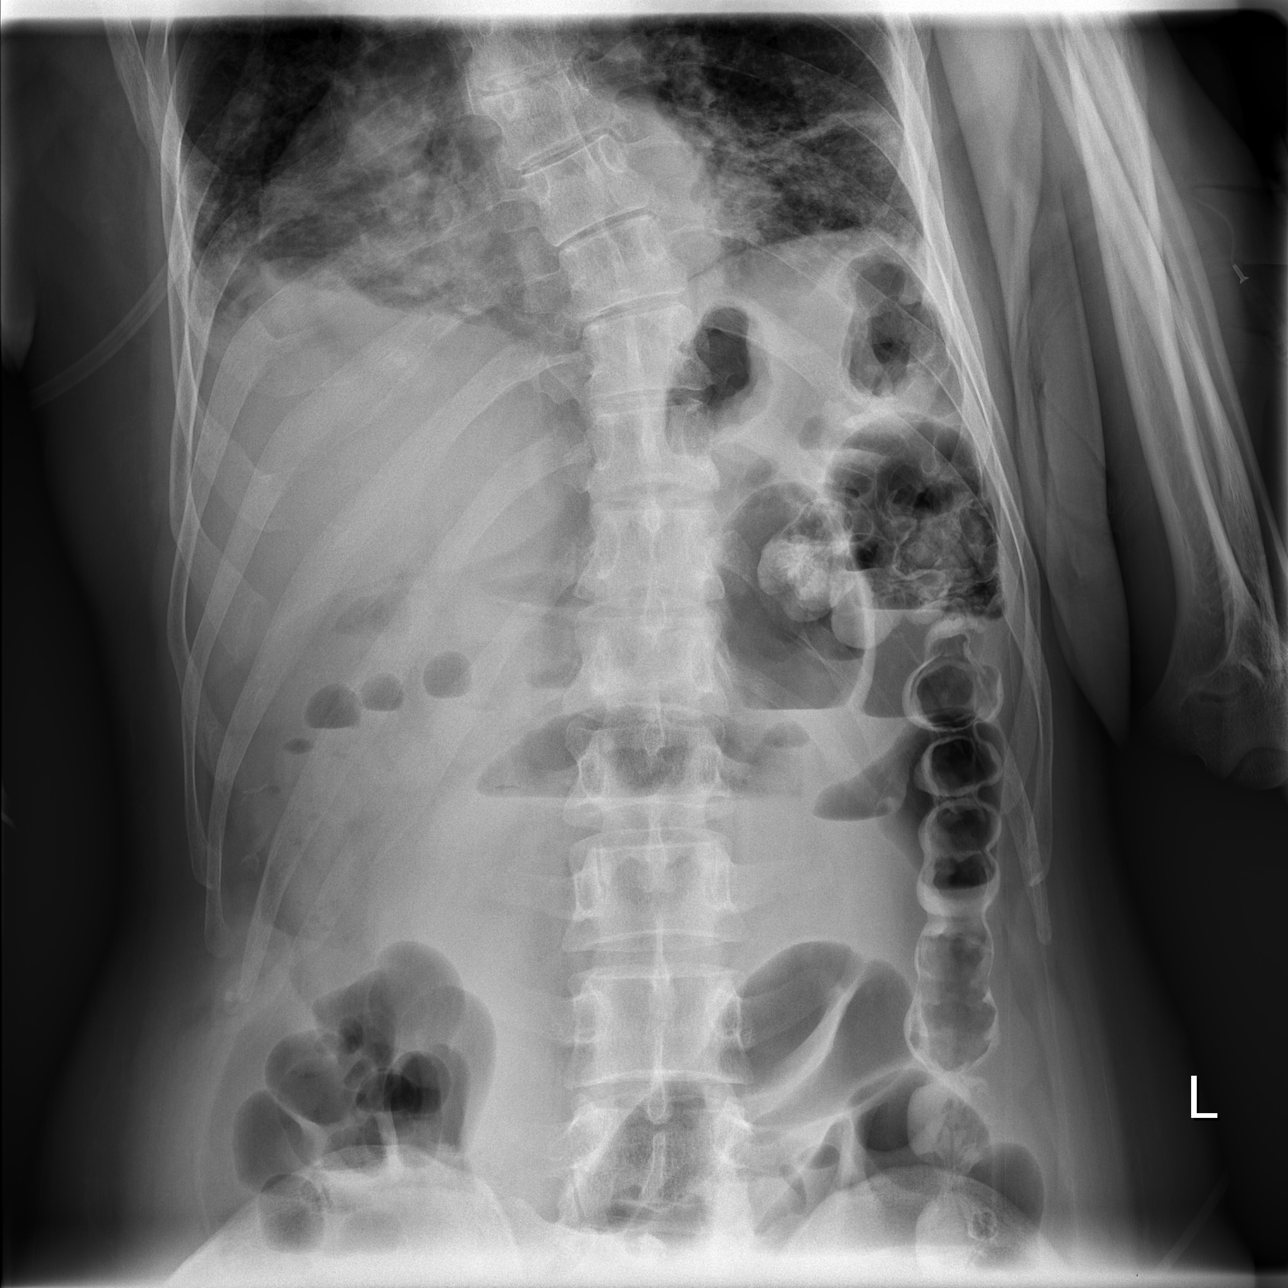

[t abdomen supine (1 of 2)]
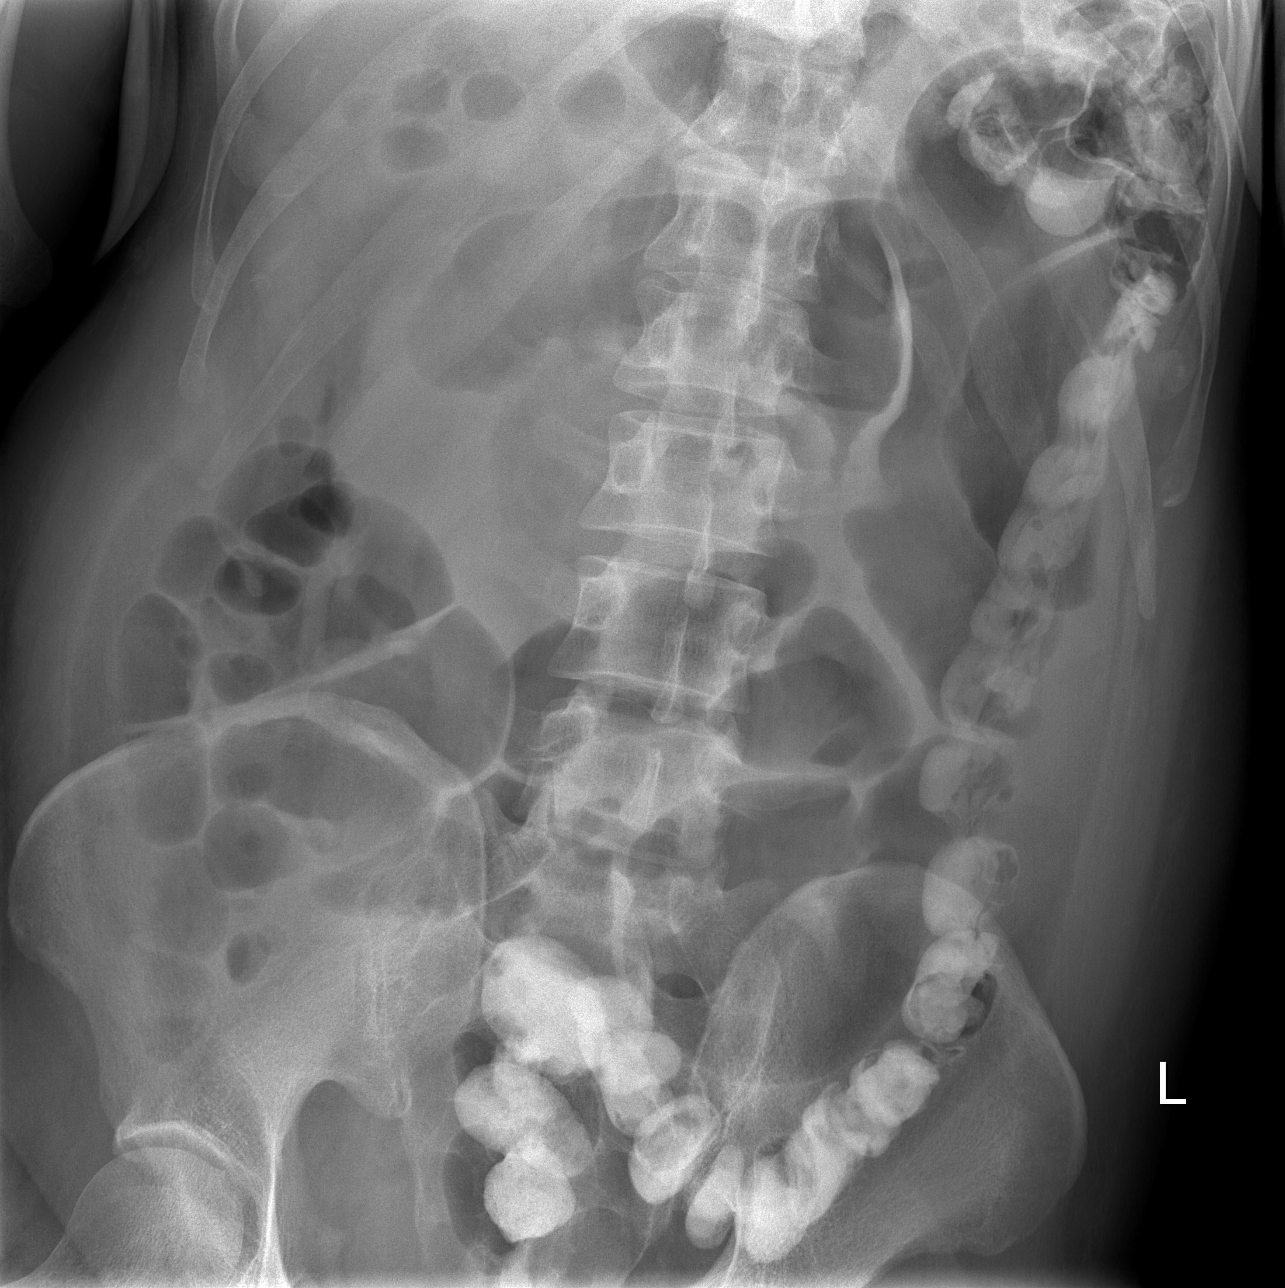

[t abdomen supine (2 of 2)]
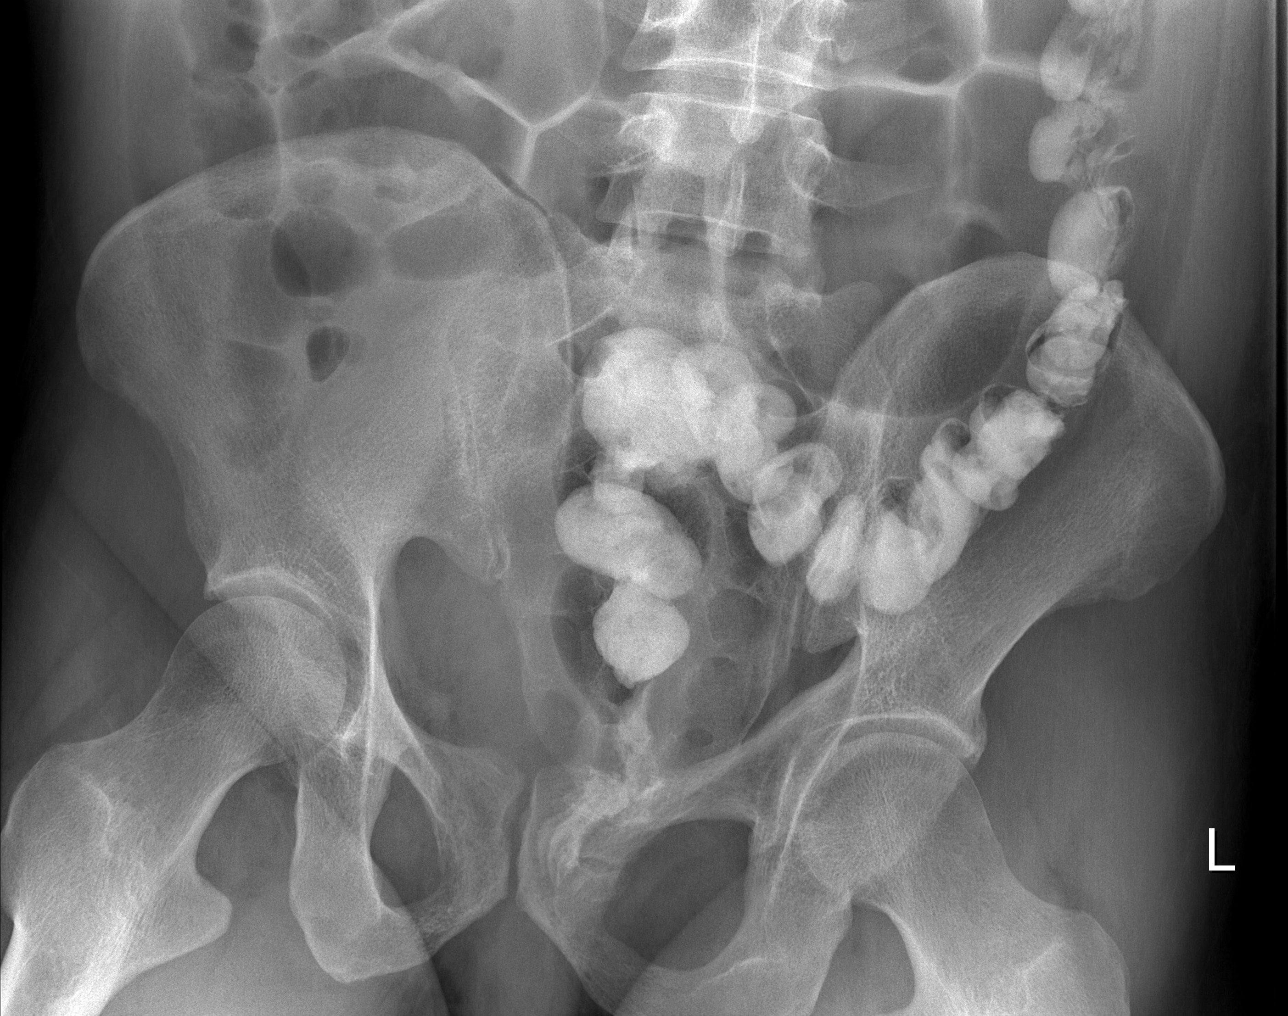

[3 of 3 positions shown; findings below may reference images not displayed]

FINDINGS: There has been interval transit of previously ingested enteric
contrast, now seen within in the descending and sigmoid colon to the
level of the rectum.

There is persistent mild gaseous distention of several loops of
small bowel with apparent air-fluid level within the mid abdomen. No
pneumoperitoneum, pneumatosis or portal venous gas.

Limited visualization of the lower thorax demonstrates interval
development of bibasilar heterogeneous opacities, right greater than
left. Trace right-sided effusion is suspected.

Scoliotic curvature of the thoracolumbar spine.
IMPRESSION: 1. Overall findings suggestive of improving though persistent
partial small bowel obstruction.
2. Interval development of bibasilar heterogeneous opacities, right
greater than left, possibly atelectasis a worrisome for multifocal
infection. Further evaluation with dedicated chest radiographs may
be performed as clinically indicated.

## 2015-05-18 DIAGNOSIS — N189 Chronic kidney disease, unspecified: Secondary | ICD-10-CM | POA: Diagnosis not present

## 2015-05-23 DIAGNOSIS — I10 Essential (primary) hypertension: Secondary | ICD-10-CM | POA: Diagnosis not present

## 2015-05-23 DIAGNOSIS — R05 Cough: Secondary | ICD-10-CM | POA: Diagnosis not present

## 2015-06-04 DIAGNOSIS — F72 Severe intellectual disabilities: Secondary | ICD-10-CM | POA: Diagnosis not present

## 2015-06-04 DIAGNOSIS — R05 Cough: Secondary | ICD-10-CM | POA: Diagnosis not present

## 2015-06-04 DIAGNOSIS — N189 Chronic kidney disease, unspecified: Secondary | ICD-10-CM | POA: Diagnosis not present

## 2015-06-04 DIAGNOSIS — T17908A Unspecified foreign body in respiratory tract, part unspecified causing other injury, initial encounter: Secondary | ICD-10-CM | POA: Diagnosis not present

## 2015-06-04 DIAGNOSIS — G40909 Epilepsy, unspecified, not intractable, without status epilepticus: Secondary | ICD-10-CM | POA: Diagnosis not present

## 2015-06-04 DIAGNOSIS — K219 Gastro-esophageal reflux disease without esophagitis: Secondary | ICD-10-CM | POA: Diagnosis not present

## 2015-07-27 DIAGNOSIS — Z748 Other problems related to care provider dependency: Secondary | ICD-10-CM | POA: Diagnosis not present

## 2015-07-27 DIAGNOSIS — R6259 Other lack of expected normal physiological development in childhood: Secondary | ICD-10-CM | POA: Diagnosis not present

## 2015-07-27 DIAGNOSIS — K219 Gastro-esophageal reflux disease without esophagitis: Secondary | ICD-10-CM | POA: Diagnosis not present

## 2015-08-30 DIAGNOSIS — H01024 Squamous blepharitis left upper eyelid: Secondary | ICD-10-CM | POA: Diagnosis not present

## 2015-08-30 DIAGNOSIS — H33051 Total retinal detachment, right eye: Secondary | ICD-10-CM | POA: Diagnosis not present

## 2015-08-30 DIAGNOSIS — H01021 Squamous blepharitis right upper eyelid: Secondary | ICD-10-CM | POA: Diagnosis not present

## 2015-09-04 DIAGNOSIS — R569 Unspecified convulsions: Secondary | ICD-10-CM | POA: Diagnosis not present

## 2015-09-11 DIAGNOSIS — R339 Retention of urine, unspecified: Secondary | ICD-10-CM | POA: Diagnosis not present

## 2015-09-11 DIAGNOSIS — N401 Enlarged prostate with lower urinary tract symptoms: Secondary | ICD-10-CM | POA: Diagnosis not present

## 2015-09-11 DIAGNOSIS — N138 Other obstructive and reflux uropathy: Secondary | ICD-10-CM | POA: Diagnosis not present

## 2015-10-04 DIAGNOSIS — I10 Essential (primary) hypertension: Secondary | ICD-10-CM | POA: Diagnosis not present

## 2015-10-04 DIAGNOSIS — H6123 Impacted cerumen, bilateral: Secondary | ICD-10-CM | POA: Diagnosis not present

## 2015-10-04 DIAGNOSIS — E039 Hypothyroidism, unspecified: Secondary | ICD-10-CM | POA: Diagnosis not present

## 2015-10-04 DIAGNOSIS — G40909 Epilepsy, unspecified, not intractable, without status epilepticus: Secondary | ICD-10-CM | POA: Diagnosis not present

## 2015-10-04 DIAGNOSIS — N189 Chronic kidney disease, unspecified: Secondary | ICD-10-CM | POA: Diagnosis not present

## 2015-10-04 DIAGNOSIS — Z131 Encounter for screening for diabetes mellitus: Secondary | ICD-10-CM | POA: Diagnosis not present

## 2015-10-04 DIAGNOSIS — R748 Abnormal levels of other serum enzymes: Secondary | ICD-10-CM | POA: Diagnosis not present

## 2015-10-04 DIAGNOSIS — E7800 Pure hypercholesterolemia, unspecified: Secondary | ICD-10-CM | POA: Diagnosis not present

## 2015-10-04 DIAGNOSIS — Z0001 Encounter for general adult medical examination with abnormal findings: Secondary | ICD-10-CM | POA: Diagnosis not present

## 2015-10-10 DIAGNOSIS — M8589 Other specified disorders of bone density and structure, multiple sites: Secondary | ICD-10-CM | POA: Diagnosis not present

## 2015-10-10 DIAGNOSIS — M810 Age-related osteoporosis without current pathological fracture: Secondary | ICD-10-CM | POA: Diagnosis not present

## 2015-10-19 DIAGNOSIS — Z748 Other problems related to care provider dependency: Secondary | ICD-10-CM | POA: Diagnosis not present

## 2015-10-19 DIAGNOSIS — R6259 Other lack of expected normal physiological development in childhood: Secondary | ICD-10-CM | POA: Diagnosis not present

## 2015-10-19 DIAGNOSIS — F8089 Other developmental disorders of speech and language: Secondary | ICD-10-CM | POA: Diagnosis not present

## 2015-10-19 DIAGNOSIS — R748 Abnormal levels of other serum enzymes: Secondary | ICD-10-CM | POA: Diagnosis not present

## 2015-12-10 DIAGNOSIS — J209 Acute bronchitis, unspecified: Secondary | ICD-10-CM | POA: Diagnosis not present

## 2015-12-18 DIAGNOSIS — R6259 Other lack of expected normal physiological development in childhood: Secondary | ICD-10-CM | POA: Diagnosis not present

## 2015-12-18 DIAGNOSIS — R05 Cough: Secondary | ICD-10-CM | POA: Diagnosis not present

## 2015-12-18 DIAGNOSIS — F8089 Other developmental disorders of speech and language: Secondary | ICD-10-CM | POA: Diagnosis not present

## 2016-02-04 DIAGNOSIS — Z23 Encounter for immunization: Secondary | ICD-10-CM | POA: Diagnosis not present

## 2016-04-02 DIAGNOSIS — Z888 Allergy status to other drugs, medicaments and biological substances status: Secondary | ICD-10-CM | POA: Diagnosis not present

## 2016-04-02 DIAGNOSIS — F840 Autistic disorder: Secondary | ICD-10-CM | POA: Diagnosis not present

## 2016-04-02 DIAGNOSIS — R55 Syncope and collapse: Secondary | ICD-10-CM | POA: Diagnosis not present

## 2016-04-02 DIAGNOSIS — Z79899 Other long term (current) drug therapy: Secondary | ICD-10-CM | POA: Diagnosis not present

## 2016-04-02 DIAGNOSIS — M4120 Other idiopathic scoliosis, site unspecified: Secondary | ICD-10-CM | POA: Diagnosis not present

## 2016-04-02 DIAGNOSIS — Z881 Allergy status to other antibiotic agents status: Secondary | ICD-10-CM | POA: Diagnosis not present

## 2016-04-02 DIAGNOSIS — R402352 Coma scale, best motor response, localizes pain, at arrival to emergency department: Secondary | ICD-10-CM | POA: Diagnosis not present

## 2016-04-02 DIAGNOSIS — R402212 Coma scale, best verbal response, none, at arrival to emergency department: Secondary | ICD-10-CM | POA: Diagnosis not present

## 2016-04-02 DIAGNOSIS — F79 Unspecified intellectual disabilities: Secondary | ICD-10-CM | POA: Diagnosis not present

## 2016-04-02 DIAGNOSIS — W19XXXA Unspecified fall, initial encounter: Secondary | ICD-10-CM | POA: Diagnosis not present

## 2016-04-02 DIAGNOSIS — I129 Hypertensive chronic kidney disease with stage 1 through stage 4 chronic kidney disease, or unspecified chronic kidney disease: Secondary | ICD-10-CM | POA: Diagnosis not present

## 2016-04-02 DIAGNOSIS — N179 Acute kidney failure, unspecified: Secondary | ICD-10-CM | POA: Diagnosis not present

## 2016-04-02 DIAGNOSIS — R569 Unspecified convulsions: Secondary | ICD-10-CM | POA: Diagnosis not present

## 2016-04-02 DIAGNOSIS — E785 Hyperlipidemia, unspecified: Secondary | ICD-10-CM | POA: Diagnosis not present

## 2016-04-02 DIAGNOSIS — R402142 Coma scale, eyes open, spontaneous, at arrival to emergency department: Secondary | ICD-10-CM | POA: Diagnosis not present

## 2016-04-02 DIAGNOSIS — M810 Age-related osteoporosis without current pathological fracture: Secondary | ICD-10-CM | POA: Diagnosis not present

## 2016-04-02 DIAGNOSIS — N183 Chronic kidney disease, stage 3 (moderate): Secondary | ICD-10-CM | POA: Diagnosis not present

## 2016-04-02 DIAGNOSIS — G3184 Mild cognitive impairment, so stated: Secondary | ICD-10-CM | POA: Diagnosis not present

## 2016-04-02 DIAGNOSIS — G4700 Insomnia, unspecified: Secondary | ICD-10-CM | POA: Diagnosis not present

## 2016-04-02 DIAGNOSIS — F488 Other specified nonpsychotic mental disorders: Secondary | ICD-10-CM | POA: Diagnosis not present

## 2016-04-03 DIAGNOSIS — M4120 Other idiopathic scoliosis, site unspecified: Secondary | ICD-10-CM | POA: Diagnosis present

## 2016-04-03 DIAGNOSIS — Z888 Allergy status to other drugs, medicaments and biological substances status: Secondary | ICD-10-CM | POA: Diagnosis not present

## 2016-04-03 DIAGNOSIS — G3184 Mild cognitive impairment, so stated: Secondary | ICD-10-CM | POA: Diagnosis present

## 2016-04-03 DIAGNOSIS — R402212 Coma scale, best verbal response, none, at arrival to emergency department: Secondary | ICD-10-CM | POA: Diagnosis present

## 2016-04-03 DIAGNOSIS — I129 Hypertensive chronic kidney disease with stage 1 through stage 4 chronic kidney disease, or unspecified chronic kidney disease: Secondary | ICD-10-CM | POA: Diagnosis present

## 2016-04-03 DIAGNOSIS — W19XXXA Unspecified fall, initial encounter: Secondary | ICD-10-CM | POA: Diagnosis not present

## 2016-04-03 DIAGNOSIS — F79 Unspecified intellectual disabilities: Secondary | ICD-10-CM | POA: Diagnosis present

## 2016-04-03 DIAGNOSIS — G4700 Insomnia, unspecified: Secondary | ICD-10-CM | POA: Diagnosis present

## 2016-04-03 DIAGNOSIS — I10 Essential (primary) hypertension: Secondary | ICD-10-CM | POA: Diagnosis not present

## 2016-04-03 DIAGNOSIS — E785 Hyperlipidemia, unspecified: Secondary | ICD-10-CM | POA: Diagnosis not present

## 2016-04-03 DIAGNOSIS — I452 Bifascicular block: Secondary | ICD-10-CM | POA: Diagnosis not present

## 2016-04-03 DIAGNOSIS — R001 Bradycardia, unspecified: Secondary | ICD-10-CM | POA: Diagnosis not present

## 2016-04-03 DIAGNOSIS — R402142 Coma scale, eyes open, spontaneous, at arrival to emergency department: Secondary | ICD-10-CM | POA: Diagnosis present

## 2016-04-03 DIAGNOSIS — R402352 Coma scale, best motor response, localizes pain, at arrival to emergency department: Secondary | ICD-10-CM | POA: Diagnosis present

## 2016-04-03 DIAGNOSIS — Z881 Allergy status to other antibiotic agents status: Secondary | ICD-10-CM | POA: Diagnosis not present

## 2016-04-03 DIAGNOSIS — N183 Chronic kidney disease, stage 3 (moderate): Secondary | ICD-10-CM | POA: Diagnosis present

## 2016-04-03 DIAGNOSIS — R258 Other abnormal involuntary movements: Secondary | ICD-10-CM | POA: Diagnosis not present

## 2016-04-03 DIAGNOSIS — R55 Syncope and collapse: Secondary | ICD-10-CM | POA: Diagnosis not present

## 2016-04-03 DIAGNOSIS — M810 Age-related osteoporosis without current pathological fracture: Secondary | ICD-10-CM | POA: Diagnosis present

## 2016-04-03 DIAGNOSIS — F488 Other specified nonpsychotic mental disorders: Secondary | ICD-10-CM | POA: Diagnosis not present

## 2016-04-03 DIAGNOSIS — I444 Left anterior fascicular block: Secondary | ICD-10-CM | POA: Diagnosis not present

## 2016-04-03 DIAGNOSIS — Z79899 Other long term (current) drug therapy: Secondary | ICD-10-CM | POA: Diagnosis not present

## 2016-04-03 DIAGNOSIS — F840 Autistic disorder: Secondary | ICD-10-CM | POA: Diagnosis not present

## 2016-04-03 DIAGNOSIS — N179 Acute kidney failure, unspecified: Secondary | ICD-10-CM | POA: Diagnosis not present

## 2016-04-07 ENCOUNTER — Ambulatory Visit (INDEPENDENT_AMBULATORY_CARE_PROVIDER_SITE_OTHER): Payer: Medicare Other | Admitting: Family Medicine

## 2016-04-07 ENCOUNTER — Encounter: Payer: Self-pay | Admitting: Family Medicine

## 2016-04-07 VITALS — BP 139/65 | HR 67 | Ht 72.0 in | Wt 187.0 lb

## 2016-04-07 DIAGNOSIS — Z0000 Encounter for general adult medical examination without abnormal findings: Secondary | ICD-10-CM | POA: Diagnosis not present

## 2016-04-07 DIAGNOSIS — I6523 Occlusion and stenosis of bilateral carotid arteries: Secondary | ICD-10-CM | POA: Diagnosis not present

## 2016-04-07 DIAGNOSIS — N179 Acute kidney failure, unspecified: Secondary | ICD-10-CM | POA: Diagnosis not present

## 2016-04-07 DIAGNOSIS — Z905 Acquired absence of kidney: Secondary | ICD-10-CM

## 2016-04-07 MED ORDER — ATORVASTATIN CALCIUM 40 MG PO TABS
40.0000 mg | ORAL_TABLET | Freq: Every day | ORAL | 3 refills | Status: DC
Start: 2016-04-07 — End: 2016-12-03

## 2016-04-07 NOTE — Patient Instructions (Signed)
Keep up a regular exercise program and make sure you are eating a healthy diet Try to eat 4 servings of dairy a day, or if you are lactose intolerant take a calcium with vitamin D daily.  Your vaccines are up to date.   

## 2016-04-07 NOTE — Progress Notes (Addendum)
Subjective:   Richard [name] is a 61 y.o. male with MRIwho presents for Medicare Annual/Subsequent preventive examination.  Patient was recently admitted to the hospital on January 3 after a syncopal episode. He was admitted to [facility]. Diagnosed with acute kidney injury and syncope. She was out eating at a restaurant when this occurred. They think the syncopal episode lasted about 40 seconds. They felt it was likely vasovagal. His kidney function was 2.0 upon admission. 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  Review of Systems:  Perhaps of review of systems is negative.       Objective:    Vitals: BP 139/65   Pulse 67   Ht 6' (1.829 m)   Wt 187 lb (84.8 kg)   SpO2 100%   BMI 25.36 kg/m   Body mass index is 25.36 kg/m.  Physical Exam  Constitutional: He is oriented to person, place, and time. He appears well-developed and well-nourished.  HENT:  Head: Normocephalic and atraumatic.  Right Ear: External ear normal.  Left Ear: External ear normal.  Nose: Nose normal.  Mouth/Throat: Oropharynx is clear and moist.  TMs and canals are clear.   Eyes: Conjunctivae and EOM are normal. Pupils are equal, round, and reactive to light.  Neck: Neck supple. No thyromegaly present.  Cardiovascular: Normal rate and normal heart sounds.   Pulmonary/Chest: Effort normal and breath sounds normal.  Abdominal: Soft. Bowel sounds are normal. He exhibits no distension. There is no tenderness.  Musculoskeletal: He exhibits no edema or deformity.  Lymphadenopathy:    He has no cervical adenopathy.  Neurological: He is alert and oriented to person, place, and time.  Skin: Skin is warm and dry.  Psychiatric: He has a normal mood and affect.     Tobacco History  Smoking Status  . Never Smoker  Smokeless Tobacco  . Never Used     Counseling given: Not Answered   Past Medical History:   Diagnosis Date  . Autism spectrum disorder   . Bowel obstruction   . Broken leg Left  . CKD (chronic kidney disease) stage 3, GFR 30-59 ml/min 11/07/2013  . Detached retina   . Hypercholesteremia   . Hyperlipidemia   . Malnutrition (HCC)   . Mental retardation   . Osteoporosis   . Thyroid disease    Past Surgical History:  Procedure Laterality Date  . ABDOMINAL SURGERY    . APPENDECTOMY    . EYE SURGERY Left 03-14-2013   catatract  . LEG SURGERY     Family History  Problem Relation Age of Onset  . Hypertension Other   . Diabetes Other   . Hyperlipidemia Other   . Scoliosis Other    History  Sexual Activity  . Sexual activity: No    Outpatient Encounter Prescriptions as of 04/07/2016  Medication Sig  . Calcium Carbonate-Vitamin D 600-400 MG-UNIT tablet Take 1 tablet by mouth.  . docusate sodium (COLACE) 100 MG capsule Take 1 capsule (100 mg total) by mouth 2 (two) times daily. Patient is given this medication to help with constipation.  Marland Kitchen lisinopril-hydrochlorothiazide (PRINZIDE,ZESTORETIC) 10-12.5 MG per tablet Take 0.5 tablets by mouth daily. Taking 1/2 tab  . Multiple Vitamins-Minerals (MULTIVITAMIN WITH MINERALS) tablet Take 1 tablet by mouth daily.  Marland Kitchen omeprazole (PRILOSEC) 40 MG capsule   . PHENobarbital (LUMINAL) 32.4 MG tablet Take 32.4-64.8 mg by mouth 2 (two) times daily. Take 1 tablet (32.4  mg) at noon and Take 2 tablets (64.8 mg) at bedtime.  . polyethylene glycol (MIRALAX / GLYCOLAX) packet Take 17 g by mouth every other day.  . SUMAtriptan (IMITREX) 100 MG tablet Take 1 tablet (100 mg total) by mouth once. May repeat in 2 hours if headache persists or recurs.  . tamsulosin (FLOMAX) 0.4 MG CAPS capsule Take 1 capsule (0.4 mg total) by mouth daily.  . traZODone (DESYREL) 100 MG tablet Take 100 mg by mouth at bedtime as needed for sleep.   . [DISCONTINUED] simvastatin (ZOCOR) 40 MG tablet Take 40 mg by mouth every evening.  Marland Kitchen. atorvastatin (LIPITOR) 40 MG tablet  Take 1 tablet (40 mg total) by mouth daily.  . [DISCONTINUED] Calcium Carbonate-Vitamin D 600-400 MG-UNIT tablet Take 1 tablet by mouth 2 (two) times daily.   . [DISCONTINUED] cholecalciferol (VITAMIN D) 1000 UNITS tablet Take 2,000 Units by mouth daily.  . [DISCONTINUED] famotidine (PEPCID) 20 MG tablet Take 20 mg by mouth 2 (two) times daily.   . [DISCONTINUED] metroNIDAZOLE (FLAGYL) 500 MG tablet Take 1 tablet (500 mg total) by mouth 2 (two) times daily.   No facility-administered encounter medications on file as of 04/07/2016.     Activities of Daily Living In your present state of health, do you have any difficulty performing the following activities: 04/09/2016  Hearing? N  Vision? N  Difficulty concentrating or making decisions? Y  Walking or climbing stairs? N  Dressing or bathing? Y  Doing errands, shopping? Y  Some recent data might be hidden    Patient Care Team: Agapito Gamesatherine D Metheney, MD as PCP - General (Family Medicine)   Assessment:    Medicare Wellness Exam  Exercise Activities and Dietary recommendations Current Exercise Habits: Home exercise routine, Type of exercise: walking, Intensity: Mild  Goals    None     Fall Risk No flowsheet data found. Depression Screen No flowsheet data found.  Cognitive Function    Not done because the patient's intellectual disability. He is not able to properly answer questions regarding depression. Ability screening was done by his sister with whom he lives part of the year.      Immunization History  Administered Date(s) Administered  . Influenza,inj,Quad PF,36+ Mos 11/30/2014  . Influenza-Unspecified 11/29/2012, 01/09/2016   Screening Tests Health Maintenance  Topic Date Due  . Hepatitis C Screening  Sep 04, 1955  . HIV Screening  10/13/1972  . COLONOSCOPY  10/14/2007  . TETANUS/TDAP  04/07/2017 (Originally 10/13/1976)  . INFLUENZA VACCINE  Completed      Plan:    During the course of the visit the patient was  educated and counseled about the following appropriate screening and preventive services:   Vaccines to include Pneumoccal, Influenza, Hepatitis B, Td, Zostavax, HCV - sister says his Tdap is UTD. She will try to get us that information.   Cardiovascular Disease  Colorectal cancer screening - UTD just need to get report. Done last year.    Prostate Cancer Screening  Atherosclerosis of internal carotid arteries-we'll switch to Lipitor.  AKI - recheck BUN/Cr today.  He does have a single kidney; recommend referral to nephrology. [place] was [place] in [place].  Patient Instructions (the written plan) was given to the patient.    METHENEY,CATHERINE, MD  04/09/2016

## 2016-04-23 DIAGNOSIS — N183 Chronic kidney disease, stage 3 (moderate): Secondary | ICD-10-CM | POA: Diagnosis not present

## 2016-05-19 ENCOUNTER — Other Ambulatory Visit: Payer: Self-pay | Admitting: Family Medicine

## 2016-05-20 NOTE — Telephone Encounter (Signed)
Is this refill appropriate?  

## 2016-05-28 ENCOUNTER — Ambulatory Visit (INDEPENDENT_AMBULATORY_CARE_PROVIDER_SITE_OTHER): Payer: Medicare Other | Admitting: Family Medicine

## 2016-05-28 ENCOUNTER — Encounter: Payer: Self-pay | Admitting: Family Medicine

## 2016-05-28 ENCOUNTER — Other Ambulatory Visit: Payer: Self-pay | Admitting: Family Medicine

## 2016-05-28 VITALS — BP 136/90 | HR 85 | Ht 72.0 in | Wt 187.0 lb

## 2016-05-28 DIAGNOSIS — I6523 Occlusion and stenosis of bilateral carotid arteries: Secondary | ICD-10-CM

## 2016-05-28 DIAGNOSIS — J0100 Acute maxillary sinusitis, unspecified: Secondary | ICD-10-CM | POA: Diagnosis not present

## 2016-05-28 MED ORDER — AMOXICILLIN 875 MG PO TABS: 875.0000 mg | ORAL_TABLET | Freq: Two times a day (BID) | ORAL | 0 refills | Status: DC

## 2016-05-28 NOTE — Progress Notes (Signed)
   Subjective:    Patient ID: Richard [name], male    DOB: [birth year], 61 y.o.   MRN: [id number]  HPI  CoughAnd nasal congestion x 1 week. No fever. His sister who is here with him today says he really is not able to blow his nose which is gets full of mucus and then drains. He's actually had a fair amount of blood coming out of hisright nostril. He's also been holding his head a lot as if he has a headache. She says he has not been grabbing her holding his ears. She's also notices had a decreased appetite. She wasn't sure what to give him.   Review of Systems     Objective:   Physical Exam  Constitutional: He is oriented to person, place, and time. He appears well-developed and well-nourished.  He is sitting holding his head today.  HENT:  Head: Normocephalic and atraumatic.  Right Ear: External ear normal.  Left Ear: External ear normal.  Nose: Nose normal.  Mouth/Throat: Oropharynx is clear and moist.  TMs and canals are clear. Right nostril with a lot of bloody mucus.  Eyes: Conjunctivae and EOM are normal. Pupils are equal, round, and reactive to light. Right eye exhibits no discharge. Left eye exhibits no discharge.  Neck: Neck supple. No thyromegaly present.  Cardiovascular: Normal rate and normal heart sounds.   Pulmonary/Chest: Effort normal and breath sounds normal.  Lymphadenopathy:    He has no cervical adenopathy.  Neurological: He is alert and oriented to person, place, and time.  Skin: Skin is warm and dry.  Psychiatric: He has a normal mood and affect.          Assessment & Plan:  Acute sinusitis with bronchitis-we'll go ahead and treat with amoxicillin 10 days. Encourage hydration and okay to use Tylenol as needed for fever pain relief. Call if getting worse in the next couple days or call if not better by Monday.

## 2016-06-03 DIAGNOSIS — N183 Chronic kidney disease, stage 3 (moderate): Secondary | ICD-10-CM | POA: Diagnosis not present

## 2016-06-25 ENCOUNTER — Other Ambulatory Visit: Payer: Self-pay | Admitting: *Deleted

## 2016-06-25 ENCOUNTER — Other Ambulatory Visit: Payer: Self-pay | Admitting: Family Medicine

## 2016-06-27 ENCOUNTER — Other Ambulatory Visit: Payer: Self-pay | Admitting: Family Medicine

## 2016-07-22 ENCOUNTER — Other Ambulatory Visit: Payer: Self-pay | Admitting: Family Medicine

## 2016-08-27 ENCOUNTER — Telehealth: Payer: Self-pay

## 2016-08-27 ENCOUNTER — Other Ambulatory Visit: Payer: Self-pay | Admitting: Family Medicine

## 2016-08-27 NOTE — Telephone Encounter (Signed)
Pharmacy called asking for a refill request for phenobarbital, pt is completely out.  Please advise.

## 2016-09-10 DIAGNOSIS — F840 Autistic disorder: Secondary | ICD-10-CM | POA: Diagnosis not present

## 2016-09-24 ENCOUNTER — Other Ambulatory Visit: Payer: Self-pay | Admitting: Family Medicine

## 2016-10-20 ENCOUNTER — Other Ambulatory Visit: Payer: Self-pay | Admitting: Family Medicine

## 2016-11-21 ENCOUNTER — Other Ambulatory Visit: Payer: Self-pay | Admitting: Family Medicine

## 2016-11-25 ENCOUNTER — Telehealth: Payer: Self-pay | Admitting: Family Medicine

## 2016-11-25 NOTE — Telephone Encounter (Signed)
Spoke with Joni Reining, advised Rx's were refilled.  Called pharmacy to verify they received Rx.

## 2016-11-25 NOTE — Telephone Encounter (Signed)
Richard [name] came in stating that patient's prescription for Phenabarbital was denied. Could you please look into this? Thanks!

## 2016-12-03 ENCOUNTER — Other Ambulatory Visit: Payer: Self-pay | Admitting: *Deleted

## 2016-12-18 ENCOUNTER — Other Ambulatory Visit: Payer: Self-pay | Admitting: Family Medicine

## 2016-12-23 ENCOUNTER — Telehealth: Payer: Self-pay

## 2016-12-23 NOTE — Telephone Encounter (Signed)
Pt is requesting a refill on Phenobarbital, he has an appointment on 10/2.  Please advise.

## 2016-12-24 MED ORDER — PHENOBARBITAL 32.4 MG PO TABS: ORAL_TABLET | ORAL | 0 refills | Status: DC

## 2016-12-24 NOTE — Telephone Encounter (Signed)
Printed Rx.

## 2016-12-24 NOTE — Telephone Encounter (Signed)
Ok to fill for 2 week supply and needs appt.

## 2016-12-30 ENCOUNTER — Ambulatory Visit (INDEPENDENT_AMBULATORY_CARE_PROVIDER_SITE_OTHER): Payer: Medicare Other | Admitting: Family Medicine

## 2016-12-30 ENCOUNTER — Encounter: Payer: Self-pay | Admitting: Family Medicine

## 2016-12-30 VITALS — BP 136/79 | HR 79 | Ht 72.0 in | Wt 157.0 lb

## 2016-12-30 DIAGNOSIS — E7800 Pure hypercholesterolemia, unspecified: Secondary | ICD-10-CM

## 2016-12-30 DIAGNOSIS — F79 Unspecified intellectual disabilities: Secondary | ICD-10-CM | POA: Diagnosis not present

## 2016-12-30 DIAGNOSIS — I6523 Occlusion and stenosis of bilateral carotid arteries: Secondary | ICD-10-CM | POA: Diagnosis not present

## 2016-12-30 DIAGNOSIS — Z23 Encounter for immunization: Secondary | ICD-10-CM

## 2016-12-30 DIAGNOSIS — Z6821 Body mass index (BMI) 21.0-21.9, adult: Secondary | ICD-10-CM

## 2016-12-30 DIAGNOSIS — N183 Chronic kidney disease, stage 3 unspecified: Secondary | ICD-10-CM

## 2016-12-30 DIAGNOSIS — R634 Abnormal weight loss: Secondary | ICD-10-CM | POA: Diagnosis not present

## 2016-12-30 DIAGNOSIS — G40909 Epilepsy, unspecified, not intractable, without status epilepticus: Secondary | ICD-10-CM | POA: Diagnosis not present

## 2016-12-30 MED ORDER — OMEPRAZOLE 40 MG PO CPDR: DELAYED_RELEASE_CAPSULE | ORAL | 3 refills | Status: DC

## 2016-12-30 MED ORDER — MULTI-VITAMIN/MINERALS PO TABS: 1.0000 | ORAL_TABLET | Freq: Every day | ORAL | 1 refills | Status: DC

## 2016-12-30 MED ORDER — CALCIUM CARBONATE-VITAMIN D 600-400 MG-UNIT PO TABS: 1.0000 | ORAL_TABLET | Freq: Every day | ORAL | 3 refills | Status: DC

## 2016-12-30 MED ORDER — PHENOBARBITAL 64.8 MG PO TABS: 64.8000 mg | ORAL_TABLET | Freq: Every day | ORAL | 3 refills | Status: DC

## 2016-12-30 MED ORDER — DOCUSATE SODIUM 100 MG PO CAPS: 100.0000 mg | ORAL_CAPSULE | Freq: Two times a day (BID) | ORAL | 3 refills | Status: DC

## 2016-12-30 MED ORDER — TAMSULOSIN HCL 0.4 MG PO CAPS: 0.4000 mg | ORAL_CAPSULE | Freq: Every day | ORAL | 1 refills | Status: DC

## 2016-12-30 MED ORDER — MELATONIN 3 MG PO TABS: 1.0000 | ORAL_TABLET | Freq: Every evening | ORAL | 1 refills | Status: DC | PRN

## 2016-12-30 MED ORDER — SIMVASTATIN 40 MG PO TABS: 40.0000 mg | ORAL_TABLET | Freq: Every day | ORAL | 3 refills | Status: DC

## 2016-12-30 MED ORDER — VITAMIN D 1000 UNITS PO TABS: 1000.0000 [IU] | ORAL_TABLET | Freq: Every day | ORAL | 3 refills | Status: DC

## 2016-12-30 MED ORDER — CARBAMIDE PEROXIDE 6.5 % OT SOLN: 5.0000 [drp] | OTIC | 1 refills | Status: DC | PRN

## 2016-12-30 MED ORDER — POLYETHYLENE GLYCOL 3350 17 GM/SCOOP PO POWD: 17.0000 g | Freq: Every day | ORAL | 99 refills | Status: DC

## 2016-12-30 MED ORDER — PHENOBARBITAL 32.4 MG PO TABS: ORAL_TABLET | ORAL | 3 refills | Status: DC

## 2016-12-30 MED ORDER — TRAZODONE HCL 100 MG PO TABS: 100.0000 mg | ORAL_TABLET | Freq: Every day | ORAL | 3 refills | Status: DC

## 2016-12-30 NOTE — Progress Notes (Signed)
Subjective:    Patient ID: Richard [name], male    DOB: 10/12/1955, 61 y.o.   MRN: [id number]  HPI 61 year old male with known mental retardation and autism and seizure disorder is here today as he will be transitioning into a group home. It is been more difficult for his family to continue to take care of him. For several years he's been going back and forth between family members. He is here today with one of the representatives from the group home.  His brother who is also here with him today reports that he has lost about 13 pounds in the last year. He says overall he eats healthy and seems to have a good appetite so he is not sure why he's had significant weight loss. He reports that he did have a colonoscopy done in 2012 in [place]. We do not have a copy of our file but says that it was normal. No unusual fevers chills or rashes. They're not aware of him being any pain or discomfort.  Review of Systems  Patient is nonverbal so unable to complete the review of systems.  BP 136/79   Pulse 79   Ht 6' (1.829 m)   Wt 157 lb (71.2 kg)   SpO2 97%   BMI 21.29 kg/m     Allergies  Allergen Reactions  . Flucloxacillin   . Nsaids Other (See Comments)    Only one kidney.   . Ofloxacin     Unsure   . Phenothiazines Other (See Comments)    Unknown    Past Medical History:  Diagnosis Date  . Autism spectrum disorder   . Bowel obstruction (HCC)   . Broken leg Left  . CKD (chronic kidney disease) stage 3, GFR 30-59 ml/min (HCC) 11/07/2013  . Detached retina   . Hypercholesteremia   . Hyperlipidemia   . Malnutrition (HCC)   . Mental retardation   . Osteoporosis   . Thyroid disease     Past Surgical History:  Procedure Laterality Date  . ABDOMINAL SURGERY    . APPENDECTOMY    . EYE SURGERY Left 03-14-2013   catatract  . LEG SURGERY      Social History   Social History  . Marital status: Single    Spouse name: N/A  . Number of children: N/A  . Years of education: N/A    Occupational History  . Not on file.   Social History Main Topics  . Smoking status: Never Smoker  . Smokeless tobacco: Never Used  . Alcohol use No  . Drug use: No  . Sexual activity: No   Other Topics Concern  . Not on file   Social History Narrative   Disabled.  Completed 2nd grade.  Lives in [place] most of the year with care provider and part of the year here with his brother who lives here in [place] and his wife.      Family History  Problem Relation Age of Onset  . Hypertension Other   . Diabetes Other   . Hyperlipidemia Other   . Scoliosis Other     Outpatient Encounter Prescriptions as of 12/30/2016  Medication Sig  . Calcium Carbonate-Vitamin D 600-400 MG-UNIT tablet Take 1 tablet by mouth daily.  . cholecalciferol (VITAMIN D) 1000 units tablet Take 1 tablet (1,000 Units total) by mouth daily.  Marland Kitchen docusate sodium (COLACE) 100 MG capsule Take 1 capsule (100 mg total) by mouth 2 (two) times daily.  . Multiple Vitamins-Minerals (MULTIVITAMIN WITH MINERALS)  tablet Take 1 tablet by mouth daily.  Marland Kitchen omeprazole (PRILOSEC) 40 MG capsule take 1 capsule by mouth every morning 1 hour before meals  . PHENobarbital (LUMINAL) 32.4 MG tablet Take 1 tablet by mouth AT NOON  . PHENobarbital (LUMINAL) 64.8 MG tablet Take 1 tablet (64.8 mg total) by mouth at bedtime.  . polyethylene glycol powder (GLYCOLAX/MIRALAX) powder Take 17 g by mouth daily.  . simvastatin (ZOCOR) 40 MG tablet Take 1 tablet (40 mg total) by mouth at bedtime.  . tamsulosin (FLOMAX) 0.4 MG CAPS capsule Take 1 capsule (0.4 mg total) by mouth daily after supper.  . traZODone (DESYREL) 100 MG tablet Take 1 tablet (100 mg total) by mouth at bedtime.  . [DISCONTINUED] Calcium Carbonate-Vitamin D 600-400 MG-UNIT tablet Take 1 tablet by mouth.  . [DISCONTINUED] Calcium Carbonate-Vitamin D 600-400 MG-UNIT tablet Take 1 tablet by mouth daily.  . [DISCONTINUED] cholecalciferol (VITAMIN D) 1000 units tablet Take 1,000 Units by  mouth daily.  . [DISCONTINUED] cholecalciferol (VITAMIN D) 1000 units tablet Take 1 tablet (1,000 Units total) by mouth daily.  . [DISCONTINUED] docusate sodium (COLACE) 100 MG capsule Take 100 mg by mouth 2 (two) times daily.  . [DISCONTINUED] Multiple Vitamins-Minerals (MULTIVITAMIN WITH MINERALS) tablet Take 1 tablet by mouth daily.  . [DISCONTINUED] Multiple Vitamins-Minerals (MULTIVITAMIN WITH MINERALS) tablet Take 1 tablet by mouth daily.  . [DISCONTINUED] omeprazole (PRILOSEC) 40 MG capsule take 1 capsule by mouth every morning 1 hour before meals  . [DISCONTINUED] omeprazole (PRILOSEC) 40 MG capsule take 1 capsule by mouth every morning 1 hour before meals  . [DISCONTINUED] PHENobarbital (LUMINAL) 32.4 MG tablet Take 1 tablet by mouth AT NOON and then 2 tablets by mouth at bedtime. Needs appointment with PCP.  . [DISCONTINUED] PHENobarbital (LUMINAL) 64.8 MG tablet Take 64.8 mg by mouth daily.  . [DISCONTINUED] polyethylene glycol powder (GLYCOLAX/MIRALAX) powder Take 17 g by mouth daily.  . [DISCONTINUED] simvastatin (ZOCOR) 40 MG tablet take 1 tablet by mouth at bedtime  . [DISCONTINUED] tamsulosin (FLOMAX) 0.4 MG CAPS capsule Take 0.4 mg by mouth daily.  . [DISCONTINUED] tamsulosin (FLOMAX) 0.4 MG CAPS capsule Take 1 capsule (0.4 mg total) by mouth daily after supper.  . [DISCONTINUED] traZODone (DESYREL) 100 MG tablet TAKE 1 TABLET BY MOUTH AT BEDTIME  . Acetaminophen (TYLENOL EXTRA STRENGTH PO) Take 1,000 mg by mouth as needed.  . carbamide peroxide (DEBROX) 6.5 % OTIC solution Place 5 drops into both ears as needed.  . Magnesium Hydroxide (MILK OF MAGNESIA PO) Take 15-30 mLs by mouth as needed.  . Melatonin 3 MG TABS Take 1 tablet (3 mg total) by mouth at bedtime as needed.  . [DISCONTINUED] carbamide peroxide (DEBROX) 6.5 % OTIC solution Place 5 drops into both ears as needed.  . [DISCONTINUED] Melatonin 3 MG TABS Take 1 tablet by mouth at bedtime as needed.   No  facility-administered encounter medications on file as of 12/30/2016.           Objective:   Physical Exam  Constitutional: He is oriented to person, place, and time. He appears well-developed and well-nourished.  HENT:  Head: Normocephalic and atraumatic.  Right Ear: External ear normal.  Left Ear: External ear normal.  Nose: Nose normal.  Mouth/Throat: Oropharynx is clear and moist.  bilat ear canals blocked by cerumen.   Eyes: Pupils are equal, round, and reactive to light. Conjunctivae and EOM are normal.  Neck: Neck supple. No thyromegaly present.  Cardiovascular: Normal rate, regular rhythm and normal heart  sounds.   Pulmonary/Chest: Effort normal and breath sounds normal.  Abdominal: Bowel sounds are normal. He exhibits no distension and no mass. There is no tenderness. There is no rebound and no guarding.  Surgical incision well healed.   Lymphadenopathy:    He has no cervical adenopathy.  Neurological: He is alert and oriented to person, place, and time.  Skin: Skin is warm and dry.  Psychiatric: He has a normal mood and affect. His behavior is normal.       Assessment & Plan:  CKD 3 - rehceck renal function. Will call with results once available.  Seizure disorder-Refilled phenobarbital today. We'll recheck labs.  Hyperlipidemia-due to check lipid panel. Overdue for lipid panel.  Abnormal weight loss-unclear etiology at this point. Per his brother's report colonoscopy is up-to-date. In they have noticed any other specific signs or symptoms that caused him to lose weight. Will check for anemia, check for thyroid disorder, check liver and kidney function.

## 2017-01-01 LAB — COMPLETE METABOLIC PANEL WITH GFR
AG Ratio: 1.9 (calc) (ref 1.0–2.5)
ALKALINE PHOSPHATASE (APISO): 115 U/L (ref 40–115)
ALT: 10 U/L (ref 9–46)
AST: 14 U/L (ref 10–35)
Albumin: 4.6 g/dL (ref 3.6–5.1)
BUN/Creatinine Ratio: 13 (calc) (ref 6–22)
BUN: 23 mg/dL (ref 7–25)
CO2: 25 mmol/L (ref 20–32)
CREATININE: 1.83 mg/dL — AB (ref 0.70–1.33)
Calcium: 9.5 mg/dL (ref 8.6–10.3)
Chloride: 104 mmol/L (ref 98–110)
GFR, EST NON AFRICAN AMERICAN: 40 mL/min/{1.73_m2} — AB (ref >=60)
GFR, Est African American: 46 mL/min/{1.73_m2} — ABNORMAL LOW (ref >=60)
GLOBULIN: 2.4 g/dL (ref 1.9–3.7)
GLUCOSE: 88 mg/dL (ref 65–99)
Potassium: 4.9 mmol/L (ref 3.5–5.3)
Sodium: 138 mmol/L (ref 135–146)
Total Bilirubin: 0.4 mg/dL (ref 0.2–1.2)
Total Protein: 7 g/dL (ref 6.1–8.1)

## 2017-01-01 LAB — CBC WITH DIFFERENTIAL/PLATELET
BASOS ABS: 20 {cells}/uL (ref 0–200)
Basophils Relative: 0.4 %
EOS PCT: 1 %
Eosinophils Absolute: 50 cells/uL (ref 15–500)
HEMATOCRIT: 42.9 % (ref 38.5–50.0)
Hemoglobin: 14.3 g/dL (ref 13.2–17.1)
Lymphs Abs: 1435 cells/uL (ref 850–3900)
MCH: 32 pg (ref 27.0–33.0)
MCHC: 33.3 g/dL (ref 32.0–36.0)
MCV: 96 fL (ref 80.0–100.0)
MONOS PCT: 13.1 %
MPV: 11.8 fL (ref 7.5–12.5)
NEUTROS PCT: 56.8 %
Neutro Abs: 2840 cells/uL (ref 1500–7800)
PLATELETS: 239 10*3/uL (ref 140–400)
RBC: 4.47 10*6/uL (ref 4.20–5.80)
RDW: 12.4 % (ref 11.0–15.0)
TOTAL LYMPHOCYTE: 28.7 %
WBC mixed population: 655 cells/uL (ref 200–950)
WBC: 5 10*3/uL (ref 3.8–10.8)

## 2017-01-01 LAB — LIPID PANEL W/REFLEX DIRECT LDL
CHOL/HDL RATIO: 2.7 (calc) (ref <5.0)
CHOLESTEROL: 173 mg/dL (ref <200)
HDL: 63 mg/dL (ref >40)
LDL CHOLESTEROL (CALC): 91 mg/dL
NON-HDL CHOLESTEROL (CALC): 110 mg/dL (ref <130)
TRIGLYCERIDES: 95 mg/dL (ref <150)

## 2017-01-01 LAB — PHENOBARBITAL LEVEL: Phenobarbital, Serum: 19.2 mg/L (ref 15.0–40.0)

## 2017-01-13 ENCOUNTER — Telehealth: Payer: Self-pay | Admitting: Family Medicine

## 2017-01-13 DIAGNOSIS — N183 Chronic kidney disease, stage 3 (moderate): Secondary | ICD-10-CM | POA: Diagnosis not present

## 2017-01-13 NOTE — Telephone Encounter (Signed)
Received call from Gust Brooms, with [facility]). She states she accompanied Pt to his last OV with PCP and requesting some information be sent to her, required by the group home. She needs the last OV, labs, and a speech referral. She also left a form to be completed regarding what standing orders were OK for the Pt. These need to be faxed to [phone].  Will route for status update.

## 2017-01-14 NOTE — Telephone Encounter (Signed)
Pt's information faxed, confirmation received .Richard [name], Richard [name]

## 2017-01-14 NOTE — Telephone Encounter (Signed)
Forms completed. Please fax today.

## 2017-01-21 ENCOUNTER — Telehealth: Payer: Self-pay | Admitting: *Deleted

## 2017-01-21 DIAGNOSIS — F840 Autistic disorder: Secondary | ICD-10-CM

## 2017-01-21 NOTE — Telephone Encounter (Signed)
OK to place referral for speech therapy.

## 2017-01-21 NOTE — Telephone Encounter (Signed)
Richard [name] from [facility] called and states she needs most recent labs faxed to them for their documentation. Labs faxed to [phone].  They are also asking for documentation that  A referral was sent for his speech. She said one was not included in whatever paper work was submitted and she will need documentation that a referral was sent by November 1.

## 2017-01-23 NOTE — Telephone Encounter (Signed)
Labs faxed and referral placed.Loralee PacasBarkley, Shaivi Rothschild [place]

## 2017-01-28 ENCOUNTER — Telehealth: Payer: Self-pay | Admitting: *Deleted

## 2017-01-28 NOTE — Telephone Encounter (Signed)
Osborne Cascoadia called regarding pt's speech therapy referral.  Faxed Referral to her.Loralee PacasBarkley, Bhavin Monjaraz [place]

## 2017-02-04 DIAGNOSIS — Z9841 Cataract extraction status, right eye: Secondary | ICD-10-CM | POA: Diagnosis not present

## 2017-02-04 DIAGNOSIS — Z881 Allergy status to other antibiotic agents status: Secondary | ICD-10-CM | POA: Diagnosis not present

## 2017-02-04 DIAGNOSIS — E785 Hyperlipidemia, unspecified: Secondary | ICD-10-CM | POA: Diagnosis not present

## 2017-02-04 DIAGNOSIS — H33021 Retinal detachment with multiple breaks, right eye: Secondary | ICD-10-CM | POA: Diagnosis not present

## 2017-02-04 DIAGNOSIS — Z961 Presence of intraocular lens: Secondary | ICD-10-CM | POA: Diagnosis not present

## 2017-02-04 DIAGNOSIS — I129 Hypertensive chronic kidney disease with stage 1 through stage 4 chronic kidney disease, or unspecified chronic kidney disease: Secondary | ICD-10-CM | POA: Diagnosis not present

## 2017-02-04 DIAGNOSIS — Z79899 Other long term (current) drug therapy: Secondary | ICD-10-CM | POA: Diagnosis not present

## 2017-02-04 DIAGNOSIS — H35411 Lattice degeneration of retina, right eye: Secondary | ICD-10-CM | POA: Diagnosis not present

## 2017-02-04 DIAGNOSIS — N183 Chronic kidney disease, stage 3 (moderate): Secondary | ICD-10-CM | POA: Diagnosis not present

## 2017-02-04 DIAGNOSIS — Z888 Allergy status to other drugs, medicaments and biological substances status: Secondary | ICD-10-CM | POA: Diagnosis not present

## 2017-02-04 DIAGNOSIS — Z9842 Cataract extraction status, left eye: Secondary | ICD-10-CM | POA: Diagnosis not present

## 2017-06-30 ENCOUNTER — Encounter: Payer: Self-pay | Admitting: Family Medicine

## 2017-06-30 ENCOUNTER — Ambulatory Visit (INDEPENDENT_AMBULATORY_CARE_PROVIDER_SITE_OTHER): Payer: Medicare Other | Admitting: Family Medicine

## 2017-06-30 VITALS — BP 130/71 | HR 66 | Ht 72.0 in | Wt 184.0 lb

## 2017-06-30 DIAGNOSIS — R634 Abnormal weight loss: Secondary | ICD-10-CM | POA: Diagnosis not present

## 2017-06-30 DIAGNOSIS — N183 Chronic kidney disease, stage 3 unspecified: Secondary | ICD-10-CM

## 2017-06-30 DIAGNOSIS — G40909 Epilepsy, unspecified, not intractable, without status epilepticus: Secondary | ICD-10-CM

## 2017-06-30 DIAGNOSIS — N289 Disorder of kidney and ureter, unspecified: Secondary | ICD-10-CM | POA: Insufficient documentation

## 2017-06-30 DIAGNOSIS — Z79899 Other long term (current) drug therapy: Secondary | ICD-10-CM | POA: Diagnosis not present

## 2017-06-30 MED ORDER — PHENOBARBITAL 32.4 MG PO TABS: ORAL_TABLET | ORAL | 3 refills | Status: DC

## 2017-06-30 MED ORDER — PHENOBARBITAL 64.8 MG PO TABS: 64.8000 mg | ORAL_TABLET | Freq: Every day | ORAL | 3 refills | Status: DC

## 2017-06-30 NOTE — Progress Notes (Signed)
Subjective:    CC: kidney checkup  HPI:  F/U CKD 3,  6 months -he is doing well overall.  No recent problems or concerns.  He does have some impairment to his kidney so we have been following that carefully.  Seizure D/O -he is due for refills on his phenobarbital as well as due for blood levels.  His last seizure was probably in the 80s or 90s.  His brother who is here with him today would like him to be at his current facility for a year and then at that point start weaning down his phenobarbital to see if he still really needs that or not.  Following weight loss - he is drinking ensure daily at [facility] and really likes it .  He has gained weight and is doing really well.  He seems very happy and settled into the new facility called [facility].   Hyperlipidemia-tolerating his cholesterol pill well without any side effects or problems.  Past medical history, Surgical history, Family history not pertinant except as noted below, Social history, Allergies, and medications have been entered into the medical record, reviewed, and corrections made.   Review of Systems: No fevers, chills, night sweats, weight loss, chest pain, or shortness of breath.   Objective:    General: Well Developed, well nourished, and in no acute distress.  Neuro: Alert and oriented x3, extra-ocular muscles intact, sensation grossly intact.  HEENT: Normocephalic, atraumatic  Skin: Warm and dry, no rashes. Cardiac: Regular rate and rhythm, no murmurs rubs or gallops, no lower extremity edema.  No carotid bruits.  No lower extremity edema. Respiratory: Clear to auscultation bilaterally. Not using accessory muscles, speaking in full sentences.   Impression and Recommendations:    CKD 3 - need up to date Cr/BUN and urine albumin.  Avoid excessive NSAIDs.  He typically uses Tylenol if needed.  Seizure D/O -seizure-free for a couple of decades at this point.  At next office visit in 6 months and consider starting to wean the  phenobarbital slowly.  Will check drug level today.  Weight loss-he is actually gained back a fair amount of weight which is fantastic.  I think drinking the boost in the afternoon is been very helpful.  We will keep an eye on it make sure he does not start to gain too much weight.  We will also check thyroid level today.  Hyperlipidemia-last set of lipids was in October and LDL was 91.  Lab Results  Component Value Date   CHOL 173 12/30/2016   HDL 63 12/30/2016   LDLCALC 91 12/30/2016   TRIG 95 12/30/2016   CHOLHDL 2.7 12/30/2016

## 2017-06-30 NOTE — Patient Instructions (Signed)
We will call with lab results   

## 2017-07-01 ENCOUNTER — Telehealth: Payer: Self-pay | Admitting: *Deleted

## 2017-07-01 DIAGNOSIS — N183 Chronic kidney disease, stage 3 (moderate): Secondary | ICD-10-CM | POA: Diagnosis not present

## 2017-07-01 DIAGNOSIS — Z79899 Other long term (current) drug therapy: Secondary | ICD-10-CM | POA: Diagnosis not present

## 2017-07-01 DIAGNOSIS — G40909 Epilepsy, unspecified, not intractable, without status epilepticus: Secondary | ICD-10-CM | POA: Diagnosis not present

## 2017-07-01 DIAGNOSIS — R634 Abnormal weight loss: Secondary | ICD-10-CM | POA: Diagnosis not present

## 2017-07-01 NOTE — Telephone Encounter (Signed)
OV notes and lab results faxed, confirmation received.Heath GoldBarkley, Jazzlene Huot [place], CMA

## 2017-07-02 LAB — COMPLETE METABOLIC PANEL WITH GFR
AG RATIO: 1.6 (calc) (ref 1.0–2.5)
ALT: 13 U/L (ref 9–46)
AST: 14 U/L (ref 10–35)
Albumin: 4.3 g/dL (ref 3.6–5.1)
Alkaline phosphatase (APISO): 131 U/L — ABNORMAL HIGH (ref 40–115)
BILIRUBIN TOTAL: 0.4 mg/dL (ref 0.2–1.2)
BUN/Creatinine Ratio: 16 (calc) (ref 6–22)
BUN: 33 mg/dL — ABNORMAL HIGH (ref 7–25)
CALCIUM: 9.4 mg/dL (ref 8.6–10.3)
CHLORIDE: 105 mmol/L (ref 98–110)
CO2: 27 mmol/L (ref 20–32)
Creat: 2.05 mg/dL — ABNORMAL HIGH (ref 0.70–1.33)
GFR, Est African American: 40 mL/min/{1.73_m2} — ABNORMAL LOW (ref >=60)
GFR, Est Non African American: 34 mL/min/{1.73_m2} — ABNORMAL LOW (ref >=60)
Globulin: 2.7 g/dL (calc) (ref 1.9–3.7)
Glucose, Bld: 55 mg/dL — ABNORMAL LOW (ref 65–99)
POTASSIUM: 4.9 mmol/L (ref 3.5–5.3)
Sodium: 143 mmol/L (ref 135–146)
Total Protein: 7 g/dL (ref 6.1–8.1)

## 2017-07-02 LAB — TSH: TSH: 3.06 mIU/L (ref 0.40–4.50)

## 2017-07-02 LAB — PHENOBARBITAL LEVEL: PHENOBARBITAL, SERUM: 15.8 mg/L (ref 15.0–40.0)

## 2017-07-02 LAB — MICROALBUMIN / CREATININE URINE RATIO
Creatinine, Urine: 71 mg/dL (ref 20–320)
MICROALB/CREAT RATIO: 252 ug/mg{creat} — AB (ref <30)
Microalb, Ur: 17.9 mg/dL

## 2017-08-06 ENCOUNTER — Ambulatory Visit (INDEPENDENT_AMBULATORY_CARE_PROVIDER_SITE_OTHER): Payer: Medicare Other | Admitting: Family Medicine

## 2017-08-06 ENCOUNTER — Encounter: Payer: Self-pay | Admitting: Family Medicine

## 2017-08-06 VITALS — BP 150/100 | HR 76 | Temp 98.4°F | Wt 182.0 lb

## 2017-08-06 DIAGNOSIS — J40 Bronchitis, not specified as acute or chronic: Secondary | ICD-10-CM | POA: Diagnosis not present

## 2017-08-06 DIAGNOSIS — J329 Chronic sinusitis, unspecified: Secondary | ICD-10-CM

## 2017-08-06 MED ORDER — DOXYCYCLINE HYCLATE 100 MG PO TABS: 100.0000 mg | ORAL_TABLET | Freq: Two times a day (BID) | ORAL | 0 refills | Status: DC

## 2017-08-06 NOTE — Progress Notes (Signed)
   Subjective:    Patient ID: Richard [name], male    DOB: 21-Feb-1956, 62 y.o.   MRN: [id number]  HPI 62 year old male with MR comes in today with a complaint of cough for 10 days.  Cough is been more productive he is also had a lot of nasal congestion as well as runny nose.  No fevers chills or sweats that they are aware of.  He is brought in today by 1 of the people from the home that he lives in as well as his brother.  Are unable to give any other detailed history.  Not currently taking any other medications except for his prescribed medicines.   Review of Systems     Objective:   Physical Exam  Constitutional: He is oriented to person, place, and time. He appears well-developed and well-nourished.  HENT:  Head: Normocephalic and atraumatic.  Right Ear: External ear normal.  Left Ear: External ear normal.  Nose: Nose normal.  Mouth/Throat: Oropharynx is clear and moist.  Canals are blocked by cerumen bilaterally.  Unable to visualize the tympanic membrane.  The runny nose.  Eyes: Pupils are equal, round, and reactive to light. Conjunctivae and EOM are normal.  Neck: Neck supple. No thyromegaly present.  Cardiovascular: Normal rate and normal heart sounds.  Pulmonary/Chest: Effort normal and breath sounds normal.  Lymphadenopathy:    He has no cervical adenopathy.  Neurological: He is alert and oriented to person, place, and time.  Skin: Skin is warm and dry.  Psychiatric: He has a normal mood and affect.       Assessment & Plan:  Acute sinobronchitis -treat with doxycycline.  Call if not improving or if develops fever or suddenly gets worse or short of breath.  Bilateral cerumen impaction.

## 2017-08-18 ENCOUNTER — Other Ambulatory Visit: Payer: Self-pay | Admitting: Family Medicine

## 2017-08-19 ENCOUNTER — Ambulatory Visit: Payer: Medicare Other

## 2017-08-19 ENCOUNTER — Telehealth: Payer: Self-pay | Admitting: Family Medicine

## 2017-08-19 NOTE — Telephone Encounter (Signed)
Pt came into clinic today accompanied by his caregiver and his brother. An attempt was made to preform bilateral ear irrigations. Pt did not tolerate procedure. Brother advised to try and use the debrox ear drops to help with wax impactions. Offered a referral to ENT. Brother denied referral at this time stating he feels that the Pt would not be able to tolerate them doing an ear irrigation either. He reports he can only get the ear drops in when the Pt is not "fully awake."  Appointment today cancelled. Will route to PCP as an FYI.

## 2017-08-20 NOTE — Telephone Encounter (Signed)
Agree with documentation as above.   Catherine Metheney, MD  

## 2017-09-02 ENCOUNTER — Ambulatory Visit (INDEPENDENT_AMBULATORY_CARE_PROVIDER_SITE_OTHER): Payer: Medicare Other

## 2017-09-02 ENCOUNTER — Encounter: Payer: Self-pay | Admitting: Family Medicine

## 2017-09-02 ENCOUNTER — Ambulatory Visit (INDEPENDENT_AMBULATORY_CARE_PROVIDER_SITE_OTHER): Payer: Medicare Other | Admitting: Family Medicine

## 2017-09-02 VITALS — BP 124/79 | HR 104 | Temp 99.5°F | Ht 72.0 in | Wt 177.0 lb

## 2017-09-02 DIAGNOSIS — R918 Other nonspecific abnormal finding of lung field: Secondary | ICD-10-CM

## 2017-09-02 DIAGNOSIS — R059 Cough, unspecified: Secondary | ICD-10-CM

## 2017-09-02 DIAGNOSIS — R197 Diarrhea, unspecified: Secondary | ICD-10-CM | POA: Diagnosis not present

## 2017-09-02 DIAGNOSIS — R05 Cough: Secondary | ICD-10-CM | POA: Diagnosis not present

## 2017-09-02 DIAGNOSIS — I517 Cardiomegaly: Secondary | ICD-10-CM

## 2017-09-02 MED ORDER — TAMSULOSIN HCL 0.4 MG PO CAPS: ORAL_CAPSULE | ORAL | 10 refills | Status: DC

## 2017-09-02 NOTE — Progress Notes (Signed)
Subjective:    Patient ID: Richard [name], male    DOB: 1956/01/06, 62 y.o.   MRN: [id number]  HPI 62 year old male comes in today to follow-up on cough.  He was actually seen on May 9 and diagnosed with sinobronchitis.  He was given a prescription for doxycycline.  At that time he had had a cough for about 10 days that was becoming more productive.  The staff where he lives was not aware of any fevers.  He is still had a persistent cough.  Mostly dry.  But they brought him in today because they were concerned that over the last several days he has not been wanting to eat.  That he did eat breakfast today.  He is also been having some diarrhea but no blood in the stools.  They are unaware of any fevers or chills.  He has been given Robitussin for the cough which they described as more dry now.  He does have a history of bowel obstruction so they were concerned about that.  He is here today from 1 of the caretakers at the facility that he lives at as well as his brother.   Review of Systems  BP 124/79   Pulse (!) 104   Temp 99.5 F (37.5 C)   Ht 6' (1.829 m)   Wt 177 lb (80.3 kg)   SpO2 97%   BMI 24.01 kg/m     Allergies  Allergen Reactions  . Flucloxacillin Other (See Comments)  . Nsaids Other (See Comments)    Only one kidney.    . Phenothiazines Other (See Comments)    Unknown  . Ofloxacin     Unsure     Past Medical History:  Diagnosis Date  . Autism spectrum disorder   . Bowel obstruction (HCC)   . Broken leg Left  . CKD (chronic kidney disease) stage 3, GFR 30-59 ml/min (HCC) 11/07/2013  . Detached retina   . Hypercholesteremia   . Hyperlipidemia   . Malnutrition (HCC)   . Mental retardation   . Osteoporosis   . Thyroid disease     Past Surgical History:  Procedure Laterality Date  . ABDOMINAL SURGERY    . APPENDECTOMY    . EYE SURGERY Left 03-14-2013   catatract  . LEG SURGERY      Social History   Socioeconomic History  . Marital status: Single    Spouse name: Not on file  . Number of children: Not on file  . Years of education: Not on file  . Highest education level: Not on file  Occupational History  . Not on file  Social Needs  . Financial resource strain: Not on file  . Food insecurity:    Worry: Not on file    Inability: Not on file  . Transportation needs:    Medical: Not on file    Non-medical: Not on file  Tobacco Use  . Smoking status: Never Smoker  . Smokeless tobacco: Never Used  Substance and Sexual Activity  . Alcohol use: No  . Drug use: No  . Sexual activity: Never  Lifestyle  . Physical activity:    Days per week: Not on file    Minutes per session: Not on file  . Stress: Not on file  Relationships  . Social connections:    Talks on phone: Not on file    Gets together: Not on file    Attends religious service: Not on file    Active member of  club or organization: Not on file    Attends meetings of clubs or organizations: Not on file    Relationship status: Not on file  . Intimate partner violence:    Fear of current or ex partner: Not on file    Emotionally abused: Not on file    Physically abused: Not on file    Forced sexual activity: Not on file  Other Topics Concern  . Not on file  Social History Narrative   Disabled.  Completed 2nd grade.  [place] in [place] Brother lives nearby.      Family History  Problem Relation Age of Onset  . Hypertension Other   . Diabetes Other   . Hyperlipidemia Other   . Scoliosis Other         Objective:   Physical Exam  Constitutional: He is oriented to person, place, and time. He appears well-developed and well-nourished.  HENT:  Head: Normocephalic and atraumatic.  Right Ear: External ear normal.  Left Ear: External ear normal.  Nose: Nose normal.  Mouth/Throat: Oropharynx is clear and moist.  TMs and canals are clear.   Eyes: Pupils are equal, round, and reactive to light. Conjunctivae and EOM are normal.  Neck: Neck supple. No  thyromegaly present.  Cardiovascular: Normal rate and normal heart sounds.  Pulmonary/Chest: Effort normal and breath sounds normal.  Lymphadenopathy:    He has no cervical adenopathy.  Neurological: He is alert and oriented to person, place, and time.  Skin: Skin is warm and dry.  Psychiatric: He has a normal mood and affect.        Assessment & Plan:  Cough-at this point he has had a cough for about 5 weeks.  I am concerned because he is lost some weight he is now having diarrhea and he is had a significantly decreased appetite over the last few days and he is borderline tachycardic on exam today.  Will get chest x-ray to rule out pneumonia.  Diarrhea-may be related to underlying infection.  They also completed a course of doxycycline so could also be C. difficile.  Culture ordered.

## 2017-09-03 LAB — CBC WITH DIFFERENTIAL/PLATELET
BASOS ABS: 10 {cells}/uL (ref 0–200)
Basophils Relative: 0.1 %
EOS ABS: 48 {cells}/uL (ref 15–500)
EOS PCT: 0.5 %
HCT: 43.5 % (ref 38.5–50.0)
HEMOGLOBIN: 15 g/dL (ref 13.2–17.1)
Lymphs Abs: 691 cells/uL — ABNORMAL LOW (ref 850–3900)
MCH: 32.3 pg (ref 27.0–33.0)
MCHC: 34.5 g/dL (ref 32.0–36.0)
MCV: 93.5 fL (ref 80.0–100.0)
MONOS PCT: 7.9 %
MPV: 13 fL — AB (ref 7.5–12.5)
NEUTROS ABS: 8093 {cells}/uL — AB (ref 1500–7800)
Neutrophils Relative %: 84.3 %
Platelets: 177 10*3/uL (ref 140–400)
RBC: 4.65 10*6/uL (ref 4.20–5.80)
RDW: 12.9 % (ref 11.0–15.0)
TOTAL LYMPHOCYTE: 7.2 %
WBC mixed population: 758 cells/uL (ref 200–950)
WBC: 9.6 10*3/uL (ref 3.8–10.8)

## 2017-09-04 ENCOUNTER — Other Ambulatory Visit: Payer: Self-pay | Admitting: Family Medicine

## 2017-09-04 ENCOUNTER — Telehealth: Payer: Self-pay | Admitting: *Deleted

## 2017-09-04 MED ORDER — AZITHROMYCIN 250 MG PO TABS: ORAL_TABLET | ORAL | 0 refills | Status: AC

## 2017-09-04 NOTE — Telephone Encounter (Signed)
Pt's results faxed, confirmation received.Heath GoldBarkley, Umer Harig Lynetta, CMA

## 2017-09-21 ENCOUNTER — Emergency Department (HOSPITAL_COMMUNITY)
Admission: EM | Admit: 2017-09-21 | Discharge: 2017-09-21 | Disposition: A | Discharge status: Home / Self Care | Payer: Medicare Other | Attending: Emergency Medicine | Admitting: Emergency Medicine

## 2017-09-21 ENCOUNTER — Encounter (HOSPITAL_COMMUNITY): Payer: Self-pay | Admitting: Emergency Medicine

## 2017-09-21 ENCOUNTER — Emergency Department (HOSPITAL_COMMUNITY): Payer: Medicare Other

## 2017-09-21 DIAGNOSIS — I129 Hypertensive chronic kidney disease with stage 1 through stage 4 chronic kidney disease, or unspecified chronic kidney disease: Secondary | ICD-10-CM | POA: Insufficient documentation

## 2017-09-21 DIAGNOSIS — F840 Autistic disorder: Secondary | ICD-10-CM | POA: Diagnosis not present

## 2017-09-21 DIAGNOSIS — N183 Chronic kidney disease, stage 3 (moderate): Secondary | ICD-10-CM | POA: Insufficient documentation

## 2017-09-21 DIAGNOSIS — K5900 Constipation, unspecified: Secondary | ICD-10-CM | POA: Diagnosis not present

## 2017-09-21 DIAGNOSIS — Z79899 Other long term (current) drug therapy: Secondary | ICD-10-CM | POA: Diagnosis not present

## 2017-09-21 DIAGNOSIS — N3000 Acute cystitis without hematuria: Secondary | ICD-10-CM

## 2017-09-21 LAB — COMPREHENSIVE METABOLIC PANEL
ALT: 17 U/L (ref 17–63)
ANION GAP: 8 (ref 5–15)
AST: 18 U/L (ref 15–41)
Albumin: 4.1 g/dL (ref 3.5–5.0)
Alkaline Phosphatase: 131 U/L — ABNORMAL HIGH (ref 38–126)
BILIRUBIN TOTAL: 0.3 mg/dL (ref 0.3–1.2)
BUN: 25 mg/dL — ABNORMAL HIGH (ref 6–20)
CO2: 27 mmol/L (ref 22–32)
Calcium: 9.1 mg/dL (ref 8.9–10.3)
Chloride: 106 mmol/L (ref 101–111)
Creatinine, Ser: 1.73 mg/dL — ABNORMAL HIGH (ref 0.61–1.24)
GFR calc non Af Amer: 41 mL/min — ABNORMAL LOW (ref >60)
GFR, EST AFRICAN AMERICAN: 48 mL/min — AB (ref >60)
GLUCOSE: 99 mg/dL (ref 65–99)
Potassium: 4.6 mmol/L (ref 3.5–5.1)
Sodium: 141 mmol/L (ref 135–145)
TOTAL PROTEIN: 7.3 g/dL (ref 6.5–8.1)

## 2017-09-21 LAB — URINALYSIS, ROUTINE W REFLEX MICROSCOPIC
Bilirubin Urine: NEGATIVE
GLUCOSE, UA: NEGATIVE mg/dL
Hgb urine dipstick: NEGATIVE
KETONES UR: NEGATIVE mg/dL
NITRITE: NEGATIVE
PH: 5 (ref 5.0–8.0)
Protein, ur: 30 mg/dL — AB
Specific Gravity, Urine: 1.01 (ref 1.005–1.030)

## 2017-09-21 LAB — CBC
HCT: 45.3 % (ref 39.0–52.0)
HEMOGLOBIN: 14.7 g/dL (ref 13.0–17.0)
MCH: 31.8 pg (ref 26.0–34.0)
MCHC: 32.5 g/dL (ref 30.0–36.0)
MCV: 98.1 fL (ref 78.0–100.0)
Platelets: 281 10*3/uL (ref 150–400)
RBC: 4.62 MIL/uL (ref 4.22–5.81)
RDW: 13.1 % (ref 11.5–15.5)
WBC: 7 10*3/uL (ref 4.0–10.5)

## 2017-09-21 LAB — LIPASE, BLOOD: Lipase: 81 U/L — ABNORMAL HIGH (ref 11–51)

## 2017-09-21 MED ORDER — IOPAMIDOL (ISOVUE-300) INJECTION 61%
100.0000 mL | Freq: Once | INTRAVENOUS | Status: AC | PRN
  Administered 2017-09-21: 80 mL via INTRAVENOUS

## 2017-09-21 MED ORDER — SULFAMETHOXAZOLE-TRIMETHOPRIM 800-160 MG PO TABS
1.0000 | ORAL_TABLET | Freq: Once | ORAL | Status: AC
  Administered 2017-09-21: 1 via ORAL
  Filled 2017-09-21: qty 1

## 2017-09-21 MED ORDER — IOPAMIDOL (ISOVUE-300) INJECTION 61%
INTRAVENOUS | Status: AC
  Filled 2017-09-21: qty 100

## 2017-09-21 MED ORDER — SODIUM CHLORIDE 0.9 % IV BOLUS
500.0000 mL | Freq: Once | INTRAVENOUS | Status: AC
  Administered 2017-09-21: 500 mL via INTRAVENOUS

## 2017-09-21 MED ORDER — SULFAMETHOXAZOLE-TRIMETHOPRIM 800-160 MG PO TABS: 1.0000 | ORAL_TABLET | Freq: Two times a day (BID) | ORAL | 0 refills | Status: DC

## 2017-09-21 NOTE — Discharge Instructions (Addendum)
CT scan and blood work reassuring. No bowel obstruction or significant constipation.   He does have a urinary tract infection. Please give him antibiotic twice a day for the next week.   His blood pressure was elevated in the ER, please have this rechecked by his regular doctor for further management.  Return to the ER if you have any new concerns like fever, vomiting or he seems to be in pain.

## 2017-09-21 NOTE — ED Triage Notes (Signed)
Patient BIB brother, patient resides at group home where staff reports patient has not had bowel movement in five days. Hx obstruction. Patient nonverbal at baseline.

## 2017-09-21 NOTE — ED Provider Notes (Signed)
[facility]-EMERGENCY DEPT Provider Note   CSN: [id number] Arrival date & time: 09/21/17  1128     History   Chief Complaint Chief Complaint  Patient presents with  . Constipation    HPI Richard [name] is a 62 y.o. male.  HPI   Richard [name] is a 62 year old male with a history of autism (nonverbal), CKD stage III, osteoporosis, hyperlipidemia, hypertension, small bowel obstruction who presents to the emergency department with his brother for evaluation of constipation.  History provided by his brother.  Level 5 caveat applies given patient is nonverbal.  Patient lives in a group home and has not had a bowel movement in the past 5 days now.  In the past this is been a sign of bowel obstruction.  He takes MiraLAX daily and drinks prune juice, no recent changes to medications.  No report of vomiting, fever, hematochezia or melena.  No recent urine malodor.  He has been otherwise acting normally, although when 1 of the caretakers press on his abdomen a few days ago he grabbed her hand as if he was in pain.  He has had an appendectomy and had surgical intervention for prior bowel obstruction, no other known abdominal surgeries.  Past Medical History:  Diagnosis Date  . Autism spectrum disorder   . Bowel obstruction (HCC)   . Broken leg Left  . CKD (chronic kidney disease) stage 3, GFR 30-59 ml/min (HCC) 11/07/2013  . Detached retina   . Hypercholesteremia   . Hyperlipidemia   . Malnutrition (HCC)   . Mental retardation   . Osteoporosis   . Thyroid disease     Patient Active Problem List   Diagnosis Date Noted  . Renal insufficiency 06/30/2017  . Renal failure (ARF), acute on chronic (HCC) 03/28/2015  . Albuminuria 08/25/2014  . 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 Lattice degeneration of right retina 05/02/2014  . Difficult intubation 05/02/2014  . Urine retention 11/08/2013  . Unspecified constipation 11/07/2013  . CKD  (chronic kidney disease) stage 3, GFR 30-59 ml/min (HCC) 11/07/2013  . Proteinuria 10/15/2013  . Essential hypertension 10/15/2013  . Pseudophakia of both eyes 03/22/2013  . Ileus (HCC) 03/16/2013  . SBO (small bowel obstruction): PROBABLE PARTIAL VS EARLY 02/24/2013  . Hyperlipidemia 09/03/2012  . Insomnia 09/03/2012  . Osteoporosis, unspecified 09/03/2012  . Idiopathic scoliosis 09/03/2012  . Seizure disorder (HCC) 09/03/2012  . Autism disorder 09/03/2012  . MR (mental retardation) 09/03/2012  . History of brain disorder 09/03/2012    Past Surgical History:  Procedure Laterality Date  . ABDOMINAL SURGERY    . APPENDECTOMY    . EYE SURGERY Left 03-14-2013   catatract  . LEG SURGERY          Home Medications    Prior to Admission medications   Medication Sig Start Date End Date Taking? Authorizing Provider  Calcium Carbonate-Vitamin D 600-400 MG-UNIT tablet Take 1 tablet by mouth daily. 12/30/16  Yes Agapito Games, MD  Carbamide Peroxide (GNP EARWAX REMOVAL DROPS OT) Place 5 drops into both ears as needed (For wax removal.).   Yes [provider]  Cholecalciferol (VITAMIN D3) 10000 units TABS Take 1 tablet by mouth daily.   Yes [provider]  docusate sodium (COLACE) 100 MG capsule Take 1 capsule (100 mg total) by mouth 2 (two) times daily. 12/30/16  Yes Agapito Games, MD  Melatonin 3 MG TABS Take 1 tablet (3 mg total) by mouth at bedtime  as needed. Patient taking differently: Take 1 tablet by mouth at bedtime as needed (For sleep.).  12/30/16  Yes Agapito Games, MD  Multiple Vitamins-Minerals (CERTAVITE/ANTIOXIDANTS PO) Take 1 tablet by mouth daily.   Yes [provider]  omeprazole (PRILOSEC) 40 MG capsule take 1 capsule by mouth every morning 1 hour before meals 12/30/16  Yes Agapito Games, MD  PHENobarbital (LUMINAL) 32.4 MG tablet Take 1 tablet by mouth AT NOON 06/30/17  Yes Agapito Games, MD  PHENobarbital  (LUMINAL) 64.8 MG tablet Take 1 tablet (64.8 mg total) by mouth at bedtime. 06/30/17  Yes Agapito Games, MD  polyethylene glycol powder (GLYCOLAX/MIRALAX) powder Take 17 g by mouth daily. 12/30/16  Yes Agapito Games, MD  simvastatin (ZOCOR) 40 MG tablet Take 1 tablet (40 mg total) by mouth at bedtime. 12/30/16  Yes Agapito Games, MD  tamsulosin (FLOMAX) 0.4 MG CAPS capsule TAKE ONE CAPSULE BY MOUTH ONCE DAILY AFTER SUPPER 09/02/17  Yes Agapito Games, MD  traZODone (DESYREL) 100 MG tablet Take 1 tablet (100 mg total) by mouth at bedtime. 12/30/16  Yes Agapito Games, MD    Family History Family History  Problem Relation Age of Onset  . Hypertension Other   . Diabetes Other   . Hyperlipidemia Other   . Scoliosis Other     Social History Social History   Tobacco Use  . Smoking status: Never Smoker  . Smokeless tobacco: Never Used  Substance Use Topics  . Alcohol use: No  . Drug use: No     Allergies   Flucloxacillin; Nsaids; Phenothiazines; and Ofloxacin   Review of Systems Review of Systems  Unable to perform ROS: Patient nonverbal     Physical Exam Updated Vital Signs BP (!) 163/136 (BP Location: Right Arm)   Pulse 90   Temp (!) 97.5 F (36.4 C) (Oral)   Resp 19   SpO2 98%   Physical Exam  Constitutional: He appears well-developed and well-nourished. No distress.  Laying at bedside, appears comfortable.  HENT:  Head: Normocephalic and atraumatic.  Mucous membranes appear somewhat dry.  Eyes: Pupils are equal, round, and reactive to light. Conjunctivae are normal. Right eye exhibits no discharge. Left eye exhibits no discharge.  Neck: Normal range of motion. Neck supple.  Cardiovascular: Normal rate, regular rhythm and intact distal pulses.  Pulmonary/Chest: Effort normal and breath sounds normal. No stridor. No respiratory distress. He has no wheezes. He has no rales.  Abdominal:  Abdomen soft. He grabs my hand with palpation of  left lower quadrant, appears somewhat painful. No guarding or rigidity.   Genitourinary:  Genitourinary Comments: Chaperone present for exam. No gross blood noted on rectal exam, normal tone, no tenderness, no mass or fissure. No impaction.    Musculoskeletal: Normal range of motion.  Neurological: He is alert. Coordination normal.  Skin: Skin is warm. He is not diaphoretic.  Psychiatric: He has a normal mood and affect. His behavior is normal.  Nursing note and vitals reviewed.    ED Treatments / Results  Labs (all labs ordered are listed, but only abnormal results are displayed) Labs Reviewed  LIPASE, BLOOD - Abnormal; Notable for the following components:      Result Value   Lipase 81 (*)    All other components within normal limits  COMPREHENSIVE METABOLIC PANEL - Abnormal; Notable for the following components:   BUN 25 (*)    Creatinine, Ser 1.73 (*)    Alkaline Phosphatase 131 (*)  GFR calc non Af Amer 41 (*)    GFR calc Af Amer 48 (*)    All other components within normal limits  URINALYSIS, ROUTINE W REFLEX MICROSCOPIC - Abnormal; Notable for the following components:   Protein, ur 30 (*)    Leukocytes, UA TRACE (*)    Bacteria, UA MANY (*)    All other components within normal limits  URINE CULTURE  CBC    EKG None  Radiology Ct Abdomen Pelvis W Contrast  Result Date: 09/21/2017 CLINICAL DATA:  Constipation and abdominal pain EXAM: CT ABDOMEN AND PELVIS WITH CONTRAST TECHNIQUE: Multidetector CT imaging of the abdomen and pelvis was performed using the standard protocol following bolus administration of intravenous contrast. CONTRAST:  80mL ISOVUE-300 COMPARISON:  11/07/2013 FINDINGS: Lower chest: No acute abnormality. Hepatobiliary: Liver demonstrates scattered small cysts as well as a wedge-shaped hypodensity in the posterior aspect of the right lobe of the liver stable from the previous CT. Gallbladder is within normal limits. Pancreas: Unremarkable. No  pancreatic ductal dilatation or surrounding inflammatory changes. Spleen: Small hypodensity is noted inferiorly consistent with a cyst. Adrenals/Urinary Tract: Adrenal glands are within normal limits. Kidneys demonstrate somewhat ptotic right kidney as well as bilateral rotation defects. A few small cysts are noted within the left kidney. No renal calculi or obstructive changes are seen. The bladder is well distended. Stomach/Bowel: Some mild retained fecal material is noted although no obstructive changes or changes of significant constipation are seen. The appendix is not visualized consistent with a prior surgical history. No small bowel obstructive changes are seen. Vascular/Lymphatic: No significant vascular findings are present. No enlarged abdominal or pelvic lymph nodes. Reproductive: Prostate is unremarkable. Other: No abdominal wall hernia or abnormality. No abdominopelvic ascites. Musculoskeletal: Degenerative changes of lumbar spine are seen. IMPRESSION: Chronic changes similar to that seen on the prior exam. Mild retained fecal material is noted although no evidence of constipation or obstruction is seen. Electronically Signed   By: Alcide CleverMark  Lukens M.D.   On: 09/21/2017 14:56    Procedures Procedures (including critical care time)  Medications Ordered in ED Medications  sodium chloride 0.9 % bolus 500 mL (0 mLs Intravenous Stopped 09/21/17 1357)  iopamidol (ISOVUE-300) 61 % injection 100 mL (80 mLs Intravenous Contrast Given 09/21/17 1428)  sulfamethoxazole-trimethoprim (BACTRIM DS,SEPTRA DS) 800-160 MG per tablet 1 tablet (1 tablet Oral Given 09/21/17 1601)     Initial Impression / Assessment and Plan / ED Course  I have reviewed the triage vital signs and the nursing notes.  Pertinent labs & imaging results that were available during my care of the patient were reviewed by me and considered in my medical decision making (see chart for details).     Patient with a history of autism who  is nonverbal presents to the emergency department with his brother for evaluation of constipation.  He has a previous history of bowel obstruction and family members are concerned that he may be in the early stages of obstruction.  No fever, vomiting or apparent pain.    On exam he is afebrile and nontoxic-appearing.  Mild tenderness over the left lower quadrant.  Rectal exam without impaction or gross blood.    Labs reviewed.  CBC WNL, no leukocytosis.  CMP reveals creatinine 1.73 which is baseline.  He does have a mild elevation in alkaline phosphatase (131).  Otherwise liver enzymes normal.  Lipase mildly elevated at 81, although this appears baseline. Urine infected.  Urine culture sent. CT abdomen/pelvis without acute intra-abdominal  abnormality.  No significant constipation or bowel obstruction.  Given no significant constipation or bowel distention on exam, do not think that patient needs an enema at this time.  Plan to treat patient's UTI with Bactrim.  He received his first dose in the ED.  Counseled brother at bedside to continue MiraLAX as prescribed.  Patient's blood pressure was mildly elevated in the ER today, have counseled family to have this rechecked by PCP.  Discussed reasons to return to the emergency department and brother at bedside agrees and voiced understanding to the above plan and appears reliable for follow-up.  Final Clinical Impressions(s) / ED Diagnoses   Final diagnoses:  Acute cystitis without hematuria  Constipation, unspecified constipation type    ED Discharge Orders        Ordered    sulfamethoxazole-trimethoprim (BACTRIM DS,SEPTRA DS) 800-160 MG tablet  2 times daily     09/21/17 1537       Lawrence Marseilles 09/21/17 2239    Bethann Berkshire, MD 09/23/17 [phone]

## 2017-09-23 LAB — URINE CULTURE: Culture: 80000 — AB

## 2017-09-24 ENCOUNTER — Telehealth: Payer: Self-pay | Admitting: *Deleted

## 2017-09-24 NOTE — Telephone Encounter (Signed)
Post ED Visit - Positive Culture Follow-up  Culture report reviewed by antimicrobial stewardship pharmacist:  []  Enzo BiNathan Batchelder, Pharm.D. []  Celedonio MiyamotoJeremy Frens, Pharm.D., BCPS AQ-ID []  Garvin FilaMike Maccia, Pharm.D., BCPS [x]  Georgina PillionElizabeth Martin, Pharm.D., BCPS []  AtlantaMinh Pham, [address] BoulevardPharm.D., BCPS, AAHIVP []  Estella HuskMichelle Turner, Pharm.D., BCPS, AAHIVP []  Lysle Pearlachel Rumbarger, PharmD, BCPS []  Sherlynn CarbonAustin Lucas, PharmD []  Pollyann SamplesAndy Johnston, PharmD, BCPS  Positive urine culture Treated with Sulfamethoxazole-trimethoprim, organism sensitive to the same and no further patient follow-up is required at this time.  Virl AxeRobertson, Xaine Sansom [facility] 09/24/2017, 11:40 AM

## 2017-09-29 DIAGNOSIS — R05 Cough: Secondary | ICD-10-CM | POA: Diagnosis not present

## 2017-09-29 DIAGNOSIS — R197 Diarrhea, unspecified: Secondary | ICD-10-CM | POA: Diagnosis not present

## 2017-10-03 LAB — CLOSTRIDIUM DIFFICILE TOXIN B, QUALITATIVE, REAL-TIME PCR: CDIFFPCR: NOT DETECTED

## 2017-10-03 LAB — STOOL CULTURE
MICRO NUMBER: 90790102
MICRO NUMBER: 90790103
MICRO NUMBER:: 90790104
SHIGA RESULT:: NOT DETECTED
SPECIMEN QUALITY: ADEQUATE
SPECIMEN QUALITY: ADEQUATE
SPECIMEN QUALITY:: ADEQUATE

## 2017-10-05 ENCOUNTER — Telehealth: Payer: Self-pay

## 2017-10-05 NOTE — Telephone Encounter (Signed)
Called and gave results from labs to [facility] [name] at [facility].  Richard [name] requested that recent labs be faxed to them at [phone]  Labs along with report from CT faxed   Confirmation received

## 2017-10-06 DIAGNOSIS — N183 Chronic kidney disease, stage 3 (moderate): Secondary | ICD-10-CM | POA: Diagnosis not present

## 2017-10-19 ENCOUNTER — Telehealth: Payer: Self-pay

## 2017-10-19 NOTE — Telephone Encounter (Signed)
I already filled those out last week and they should have been faxed back.  We may need to check the fax file.

## 2017-10-19 NOTE — Telephone Encounter (Signed)
Richard FolksAmanda with [facility]. She has sent over orders and has not received the fax back yet.

## 2017-10-20 NOTE — Telephone Encounter (Signed)
I found the paperwork and [facility]. KG LPN

## 2017-12-24 ENCOUNTER — Other Ambulatory Visit: Payer: Self-pay | Admitting: Family Medicine

## 2017-12-24 NOTE — Telephone Encounter (Signed)
Routing to pcp for signature.[name], CMA  

## 2017-12-30 ENCOUNTER — Ambulatory Visit (INDEPENDENT_AMBULATORY_CARE_PROVIDER_SITE_OTHER): Payer: Medicare Other | Admitting: Family Medicine

## 2017-12-30 ENCOUNTER — Encounter: Payer: Self-pay | Admitting: Family Medicine

## 2017-12-30 VITALS — BP 120/87 | HR 65 | Ht 72.0 in | Wt 182.0 lb

## 2017-12-30 DIAGNOSIS — G40909 Epilepsy, unspecified, not intractable, without status epilepticus: Secondary | ICD-10-CM | POA: Diagnosis not present

## 2017-12-30 DIAGNOSIS — N183 Chronic kidney disease, stage 3 unspecified: Secondary | ICD-10-CM

## 2017-12-30 DIAGNOSIS — Z114 Encounter for screening for human immunodeficiency virus [HIV]: Secondary | ICD-10-CM | POA: Diagnosis not present

## 2017-12-30 DIAGNOSIS — R634 Abnormal weight loss: Secondary | ICD-10-CM

## 2017-12-30 DIAGNOSIS — Z1159 Encounter for screening for other viral diseases: Secondary | ICD-10-CM | POA: Diagnosis not present

## 2017-12-30 DIAGNOSIS — Z23 Encounter for immunization: Secondary | ICD-10-CM

## 2017-12-30 DIAGNOSIS — I10 Essential (primary) hypertension: Secondary | ICD-10-CM

## 2017-12-30 MED ORDER — POLYETHYLENE GLYCOL 3350 17 GM/SCOOP PO POWD: 17.0000 g | Freq: Every day | ORAL | 99 refills | Status: DC

## 2017-12-30 MED ORDER — PHENOBARBITAL 30 MG PO TABS: ORAL_TABLET | ORAL | 0 refills | Status: DC

## 2017-12-30 MED ORDER — PHENOBARBITAL 60 MG PO TABS: 60.0000 mg | ORAL_TABLET | Freq: Every day | ORAL | 0 refills | Status: DC

## 2017-12-30 NOTE — Progress Notes (Signed)
Subjective:    CC: F/U BP   HPI: Hypertension- Pt denies chest pain, SOB, dizziness, or heart palpitations.  Taking meds as directed w/o problems.  Denies medication side effects.    Abnormal weight loss-this is a 67-month follow-up just to make sure that he is maintaining his weight.  He started adding a boost into his regimen daily to help with this.  History of seizure disorder-he has been on phenobarbital for decades and has not had a seizure in a very long time.  We discussed 6 months ago that at this visit we would consider starting to taper the phenobarbital down very slowly.  Past medical history, Surgical history, Family history not pertinant except as noted below, Social history, Allergies, and medications have been entered into the medical record, reviewed, and corrections made.   Review of Systems: No fevers, chills, night sweats, weight loss, chest pain, or shortness of breath.   Objective:    General: Well Developed, well nourished, and in no acute distress.  Neuro: Alert and oriented x3, extra-ocular muscles intact, sensation grossly intact.  HEENT: Normocephalic, atraumatic  Skin: Warm and dry, no rashes. Cardiac: Regular rate and rhythm, no murmurs rubs or gallops, no lower extremity edema.  Respiratory: Clear to auscultation bilaterally. Not using accessory muscles, speaking in full sentences.   Impression and Recommendations:    HTN - Well controlled. Continue current regimen. Follow up in  6 months.      Abnormal weight loss- he has gained about 5 lbs and is doing well.  He discontinued the boost but are allowing him to have double helpings at his meals.  History of seizure disorder-I would like to drop his noon dose from 32.4 mg down to 30 mg and reduce his bedtime dose from 64.8 mg down to 60 mg.  That would be a total reduction of 7.2 mg for the day.  I like to try that for 90 days and if at that point he still doing well then we can consider decreasing the  bedtime dose down to 32.4 mg.

## 2017-12-30 NOTE — Patient Instructions (Signed)
Go for labs next month sometime.

## 2018-02-04 ENCOUNTER — Encounter: Payer: Self-pay | Admitting: Family Medicine

## 2018-02-04 ENCOUNTER — Ambulatory Visit (INDEPENDENT_AMBULATORY_CARE_PROVIDER_SITE_OTHER): Payer: Medicare Other | Admitting: Family Medicine

## 2018-02-04 VITALS — BP 134/86 | HR 78 | Ht 72.0 in | Wt 185.0 lb

## 2018-02-04 DIAGNOSIS — Z1159 Encounter for screening for other viral diseases: Secondary | ICD-10-CM

## 2018-02-04 DIAGNOSIS — N183 Chronic kidney disease, stage 3 unspecified: Secondary | ICD-10-CM

## 2018-02-04 DIAGNOSIS — I10 Essential (primary) hypertension: Secondary | ICD-10-CM

## 2018-02-04 DIAGNOSIS — Z125 Encounter for screening for malignant neoplasm of prostate: Secondary | ICD-10-CM

## 2018-02-04 DIAGNOSIS — Z0000 Encounter for general adult medical examination without abnormal findings: Secondary | ICD-10-CM | POA: Diagnosis not present

## 2018-02-04 MED ORDER — AMBULATORY NON FORMULARY MEDICATION: 0 refills | Status: DC

## 2018-02-04 NOTE — Patient Instructions (Signed)
  Richard [name] , Thank you for taking time to come for your Medicare Wellness Visit. I appreciate your ongoing commitment to your health goals. Please review the following plan we discussed and let me know if I can assist you in the future.   These are the goals we discussed: Goals   None     This is a list of the screening recommended for you and due dates:  Health Maintenance  Topic Date Due  .  Hepatitis C: One time screening is recommended by Center for Disease Control  (CDC) for  adults born from [birth year] through 1963.   September 27, 1955  . Colon Cancer Screening  10/14/2007  . HIV Screening  12/31/2018*  . Tetanus Vaccine  12/31/2026  . Flu Shot  Completed  *Topic was postponed. The date shown is not the original due date.

## 2018-02-04 NOTE — Progress Notes (Signed)
Subjective:   Richard [name] is a 62 y.o. male who presents for Medicare Annual/Subsequent preventive examination. He has MR and is unable to communicate verbally. He lives in a group home  Review of Systems:  ROS is negative.         Objective:    Vitals: BP 134/86   Pulse 78   Ht 6' (1.829 m)   Wt 185 lb (83.9 kg)   SpO2 95%   BMI 25.09 kg/m   Body mass index is 25.09 kg/m.  Advanced Directives 03/28/2015 03/27/2015 11/07/2013 03/16/2013 02/25/2013 02/24/2013  Does Patient Have a Medical Advance Directive? No No Patient has advance directive, copy not in chart Patient does not have advance directive Patient has advance directive, copy not in chart -  Type of Advance Directive - - Other (Comment) Healthcare Power of [employer] Power of Attorney -  Does patient want to make changes to medical advance directive? - - No change requested No No -  Copy of Healthcare Power of Attorney in Chart? - - Copy requested from family Copy requested from family Copy requested from family -  Would patient like information on creating a medical advance directive? No - patient declined information No - patient declined information - - - -  Pre-existing out of facility DNR order (yellow form or pink MOST form) - - No No No No    Tobacco Social History   Tobacco Use  Smoking Status Never Smoker  Smokeless Tobacco Never Used     Counseling given: Not Answered   Clinical Intake:  Pre-visit preparation completed: Yes Physical Exam  Constitutional: He is oriented to person, place, and time. He appears well-developed and well-nourished.  HENT:  Head: Normocephalic and atraumatic.  Right Ear: External ear normal.  Left Ear: External ear normal.  Nose: Nose normal.  Mouth/Throat: Oropharynx is clear and moist.  Eyes: Pupils are equal, round, and reactive to light. Conjunctivae and EOM are normal.  Neck: Normal range of motion. Neck supple. No thyromegaly present.  Cardiovascular:  Normal rate, regular rhythm, normal heart sounds and intact distal pulses.  Pulmonary/Chest: Effort normal and breath sounds normal.  Abdominal: Soft. Bowel sounds are normal. He exhibits no distension and no mass. There is no tenderness. There is no rebound and no guarding.  Musculoskeletal: Normal range of motion.  Lymphadenopathy:    He has no cervical adenopathy.  Neurological: He is alert and oriented to person, place, and time. He has normal reflexes.  Skin: Skin is warm and dry.  Psychiatric: He has a normal mood and affect. His behavior is normal. Judgment and thought content normal.    Pain : No/denies pain     Nutritional Status: BMI 25 -29 Overweight Nutritional Risks: None Diabetes: No  How often do you need to have someone help you when you read instructions, pamphlets, or other written materials from your doctor or pharmacy?: 5 - Always  Interpreter Needed?: No  Information entered by :: [name] MD  Past Medical History:  Diagnosis Date  . Autism spectrum disorder   . Bowel obstruction (HCC)   . Broken leg Left  . CKD (chronic kidney disease) stage 3, GFR 30-59 ml/min (HCC) 11/07/2013  . Detached retina   . Hypercholesteremia   . Hyperlipidemia   . Malnutrition (HCC)   . Mental retardation   . Osteoporosis   . Thyroid disease    Past Surgical History:  Procedure Laterality Date  . ABDOMINAL SURGERY    . APPENDECTOMY    .  EYE SURGERY Left 03-14-2013   catatract  . LEG SURGERY     Family History  Problem Relation Age of Onset  . Hypertension Other   . Diabetes Other   . Hyperlipidemia Other   . Scoliosis Other    Social History   Socioeconomic History  . Marital status: Single    Spouse name: Not on file  . Number of children: Not on file  . Years of education: Not on file  . Highest education level: Not on file  Occupational History  . Not on file  Social Needs  . Financial resource strain: Not on file  . Food insecurity:     Worry: Not on file    Inability: Not on file  . Transportation needs:    Medical: Not on file    Non-medical: Not on file  Tobacco Use  . Smoking status: Never Smoker  . Smokeless tobacco: Never Used  Substance and Sexual Activity  . Alcohol use: No  . Drug use: No  . Sexual activity: Never  Lifestyle  . Physical activity:    Days per week: Not on file    Minutes per session: Not on file  . Stress: Not on file  Relationships  . Social connections:    Talks on phone: Not on file    Gets together: Not on file    Attends religious service: Not on file    Active member of club or organization: Not on file    Attends meetings of clubs or organizations: Not on file    Relationship status: Not on file  Other Topics Concern  . Not on file  Social History Narrative   Disabled.  Completed 2nd grade.  Lives in a facility here in [place]. Brother lives nearby.      Outpatient Encounter Medications as of 02/04/2018  Medication Sig  . AMBULATORY NON FORMULARY MEDICATION Medication Name: Shingrix IM  2 dose to be administered in 2 to 6 mos  . Calcium Carbonate-Vitamin D 600-400 MG-UNIT tablet Take 1 tablet by mouth daily.  Josefa Half Peroxide (GNP EARWAX REMOVAL DROPS OT) Place 5 drops into both ears as needed (For wax removal.).  Marland Kitchen Cholecalciferol (VITAMIN D3) 10000 units TABS Take 1 tablet by mouth daily.  Marland Kitchen docusate sodium (COLACE) 100 MG capsule Take 1 capsule (100 mg total) by mouth 2 (two) times daily.  . Melatonin 3 MG TABS Take 1 tablet (3 mg total) by mouth at bedtime as needed. (Patient taking differently: Take 1 tablet by mouth at bedtime as needed (For sleep.). )  . Multiple Vitamins-Minerals (CERTAVITE/ANTIOXIDANTS PO) Take 1 tablet by mouth daily.  Marland Kitchen omeprazole (PRILOSEC) 40 MG capsule take 1 capsule by mouth every morning 1 hour before meals  . PHENObarbital (LUMINAL) 30 MG tablet TAKE 1 TABLET BY MOUTH ONCE DAILY AT NOON  . PHENObarbital (LUMINAL) 60 MG tablet Take 1 tablet (60  mg total) by mouth at bedtime.  . polyethylene glycol powder (GLYCOLAX/MIRALAX) powder Take 17 g by mouth daily.  . simvastatin (ZOCOR) 40 MG tablet Take 1 tablet (40 mg total) by mouth at bedtime.  . tamsulosin (FLOMAX) 0.4 MG CAPS capsule TAKE ONE CAPSULE BY MOUTH ONCE DAILY AFTER SUPPER  . traZODone (DESYREL) 100 MG tablet Take 1 tablet (100 mg total) by mouth at bedtime.   No facility-administered encounter medications on file as of 02/04/2018.     Activities of Daily Living In your present state of health, do you have any difficulty performing the  following activities: 02/04/2018  Hearing? N  Vision? N  Difficulty concentrating or making decisions? Y  Walking or climbing stairs? N  Dressing or bathing? N  Doing errands, shopping? Y  Some recent data might be hidden    Patient Care Team: Agapito Games, MD as PCP - General (Family Medicine) Abran Cantor, MD as Referring Physician (Nephrology)   Assessment:   This is a routine wellness examination for [place].  Exercise Activities and Dietary recommendations    Goals   None     Fall Risk Fall Risk  02/04/2018  Falls in the past year? 0    Depression Screen PHQ 2/9 Scores 12/30/2016  PHQ - 2 Score 0    Cognitive Function    Patient unable top peform.     Immunization History  Administered Date(s) Administered  . Influenza,inj,Quad PF,6+ Mos 11/30/2014, 12/30/2016, 12/30/2017  . Influenza-Unspecified 11/29/2012, 01/09/2016  . Tdap 12/30/2016    Qualifies for Shingles Vaccine? Yes  Screening Tests Health Maintenance  Topic Date Due  . Hepatitis C Screening  [birth year]  . COLONOSCOPY  10/14/2007  . HIV Screening  12/31/2018 (Originally 10/13/1970)  . TETANUS/TDAP  12/31/2026  . INFLUENZA VACCINE  Completed   Cancer Screenings: Lung: Low Dose CT Chest recommended if Age 5-80 years, 30 pack-year currently smoking OR have quit w/in 15years. Patient does not qualify. Colorectal: Stool cards  given.   Additional Screenings:  Hepatitis C Screening:      Plan:   Medicare Welllness. Exam   I have personally reviewed and noted the following in the patient's chart:   . Medical and social history . Use of alcohol, tobacco or illicit drugs  . Current medications and supplements . Functional ability and status . Nutritional status . Physical activity . Advanced directives . List of other physicians . Hospitalizations, surgeries, and ER visits in previous 12 months . Vitals . Screenings to include cognitive, depression, and falls . Referrals and appointments . Forms completed.  . Recommend shingles vaccine - order given.    In addition, I have reviewed and discussed with patient certain preventive protocols, quality metrics, and best practice recommendations. A written personalized care plan for preventive services as well as general preventive health recommendations were provided to patient.     [name], MD  02/04/2018

## 2018-02-05 ENCOUNTER — Encounter: Payer: Self-pay | Admitting: Family Medicine

## 2018-02-05 ENCOUNTER — Telehealth: Payer: Self-pay | Admitting: *Deleted

## 2018-02-05 LAB — COMPLETE METABOLIC PANEL WITH GFR
AG RATIO: 1.9 (calc) (ref 1.0–2.5)
ALT: 10 U/L (ref 9–46)
AST: 14 U/L (ref 10–35)
Albumin: 4.7 g/dL (ref 3.6–5.1)
Alkaline phosphatase (APISO): 132 U/L — ABNORMAL HIGH (ref 40–115)
BILIRUBIN TOTAL: 0.4 mg/dL (ref 0.2–1.2)
BUN / CREAT RATIO: 16 (calc) (ref 6–22)
BUN: 27 mg/dL — ABNORMAL HIGH (ref 7–25)
CHLORIDE: 108 mmol/L (ref 98–110)
CO2: 24 mmol/L (ref 20–32)
Calcium: 10 mg/dL (ref 8.6–10.3)
Creat: 1.73 mg/dL — ABNORMAL HIGH (ref 0.70–1.25)
GFR, EST AFRICAN AMERICAN: 49 mL/min/{1.73_m2} — AB (ref >=60)
GFR, Est Non African American: 42 mL/min/{1.73_m2} — ABNORMAL LOW (ref >=60)
GLOBULIN: 2.5 g/dL (ref 1.9–3.7)
Glucose, Bld: 89 mg/dL (ref 65–99)
Potassium: 4.6 mmol/L (ref 3.5–5.3)
Sodium: 142 mmol/L (ref 135–146)
TOTAL PROTEIN: 7.2 g/dL (ref 6.1–8.1)

## 2018-02-05 LAB — CBC
HEMATOCRIT: 48.5 % (ref 38.5–50.0)
Hemoglobin: 16.4 g/dL (ref 13.2–17.1)
MCH: 32 pg (ref 27.0–33.0)
MCHC: 33.8 g/dL (ref 32.0–36.0)
MCV: 94.7 fL (ref 80.0–100.0)
MPV: 12.8 fL — AB (ref 7.5–12.5)
Platelets: 164 10*3/uL (ref 140–400)
RBC: 5.12 10*6/uL (ref 4.20–5.80)
RDW: 12.6 % (ref 11.0–15.0)
WBC: 6.9 10*3/uL (ref 3.8–10.8)

## 2018-02-05 LAB — LIPID PANEL
Cholesterol: 212 mg/dL — ABNORMAL HIGH (ref <200)
HDL: 47 mg/dL (ref >40)
LDL Cholesterol (Calc): 131 mg/dL (calc) — ABNORMAL HIGH
Non-HDL Cholesterol (Calc): 165 mg/dL (calc) — ABNORMAL HIGH (ref <130)
TRIGLYCERIDES: 203 mg/dL — AB (ref <150)
Total CHOL/HDL Ratio: 4.5 (calc) (ref <5.0)

## 2018-02-05 LAB — PSA: PSA: 2.2 ng/mL (ref <=4.0)

## 2018-02-05 LAB — HEPATITIS C ANTIBODY
HEP C AB: NONREACTIVE
SIGNAL TO CUT-OFF: 0.01 (ref <1.00)

## 2018-02-05 NOTE — Telephone Encounter (Signed)
Called and spoke w/[name] and informed her that pt will need to come in to have ppd placed and come back in 48-72 hours for this to be read. Also gave option of having the Quantiferon TB gold test which is a lab draw done. She stated that she will bring him in to have the ppd placed and come back for reading and pick up his paper work at that time. I apologized for the inconvenience this has caused. appt for [place] made for monday.Marland KitchenMarland KitchenHeath Gold, CMA

## 2018-02-08 ENCOUNTER — Ambulatory Visit: Payer: Medicare Other | Admitting: Family Medicine

## 2018-02-08 VITALS — BP 132/78 | HR 64 | Temp 97.4°F | Wt 185.0 lb

## 2018-02-08 DIAGNOSIS — Z111 Encounter for screening for respiratory tuberculosis: Secondary | ICD-10-CM | POA: Diagnosis not present

## 2018-02-08 NOTE — Progress Notes (Signed)
Agree with documentation as above.    , MD  

## 2018-02-08 NOTE — Progress Notes (Signed)
Patient in today for PPD placement. Pt has entered a new group home and this is the reason for screening.To there knowledge patient has never tested positive, PPD was placed on right forearm without complications. Pt tolerated procedure well. Group home chaperone was advised to schedule follow up in two days, nurse visit scheduled.

## 2018-02-10 ENCOUNTER — Ambulatory Visit (INDEPENDENT_AMBULATORY_CARE_PROVIDER_SITE_OTHER): Payer: Medicare Other | Admitting: Family Medicine

## 2018-02-10 DIAGNOSIS — I10 Essential (primary) hypertension: Secondary | ICD-10-CM | POA: Diagnosis not present

## 2018-02-10 DIAGNOSIS — Z125 Encounter for screening for malignant neoplasm of prostate: Secondary | ICD-10-CM | POA: Diagnosis not present

## 2018-02-10 DIAGNOSIS — Z111 Encounter for screening for respiratory tuberculosis: Secondary | ICD-10-CM

## 2018-02-10 DIAGNOSIS — Z1159 Encounter for screening for other viral diseases: Secondary | ICD-10-CM | POA: Diagnosis not present

## 2018-02-10 DIAGNOSIS — N183 Chronic kidney disease, stage 3 (moderate): Secondary | ICD-10-CM | POA: Diagnosis not present

## 2018-02-10 LAB — TB SKIN TEST
Induration: 0 mm
TB Skin Test: NEGATIVE

## 2018-02-10 NOTE — Progress Notes (Signed)
Agree with documentation as above.   Chaia Ikard, MD  

## 2018-02-10 NOTE — Progress Notes (Signed)
Pt came into clinic today for PPD read. Test was negative. Chart updated and printed copy provided. No further questions.

## 2018-02-11 LAB — URINALYSIS, ROUTINE W REFLEX MICROSCOPIC
BACTERIA UA: NONE SEEN /HPF
Bilirubin Urine: NEGATIVE
Glucose, UA: NEGATIVE
HYALINE CAST: NONE SEEN /LPF
Hgb urine dipstick: NEGATIVE
Ketones, ur: NEGATIVE
Leukocytes, UA: NEGATIVE
Nitrite: NEGATIVE
PH: 5.5 (ref 5.0–8.0)
RBC / HPF: NONE SEEN /HPF (ref 0–2)
SPECIFIC GRAVITY, URINE: 1.012 (ref 1.001–1.03)
SQUAMOUS EPITHELIAL / LPF: NONE SEEN /HPF (ref <=5)
WBC, UA: NONE SEEN /HPF (ref 0–5)

## 2018-03-11 ENCOUNTER — Other Ambulatory Visit: Payer: Self-pay | Admitting: Family Medicine

## 2018-03-11 DIAGNOSIS — G40909 Epilepsy, unspecified, not intractable, without status epilepticus: Secondary | ICD-10-CM

## 2018-03-11 NOTE — Telephone Encounter (Signed)
Requesting RF on Phenobarb.   Routing to PCP

## 2018-03-16 ENCOUNTER — Other Ambulatory Visit: Payer: Self-pay | Admitting: Family Medicine

## 2018-04-01 ENCOUNTER — Telehealth: Payer: Self-pay | Admitting: *Deleted

## 2018-04-01 ENCOUNTER — Ambulatory Visit (INDEPENDENT_AMBULATORY_CARE_PROVIDER_SITE_OTHER): Payer: Medicare Other | Admitting: Family Medicine

## 2018-04-01 ENCOUNTER — Encounter: Payer: Self-pay | Admitting: Family Medicine

## 2018-04-01 VITALS — BP 132/81 | HR 73 | Ht 72.0 in | Wt 185.0 lb

## 2018-04-01 DIAGNOSIS — N183 Chronic kidney disease, stage 3 unspecified: Secondary | ICD-10-CM

## 2018-04-01 DIAGNOSIS — G40909 Epilepsy, unspecified, not intractable, without status epilepticus: Secondary | ICD-10-CM

## 2018-04-01 DIAGNOSIS — I10 Essential (primary) hypertension: Secondary | ICD-10-CM | POA: Diagnosis not present

## 2018-04-01 DIAGNOSIS — K219 Gastro-esophageal reflux disease without esophagitis: Secondary | ICD-10-CM | POA: Diagnosis not present

## 2018-04-01 MED ORDER — OMEPRAZOLE 20 MG PO CPDR: 40.0000 mg | DELAYED_RELEASE_CAPSULE | Freq: Every day | ORAL | 3 refills | Status: DC

## 2018-04-01 MED ORDER — MELATONIN 3 MG PO TABS: 1.0000 | ORAL_TABLET | Freq: Every evening | ORAL | 1 refills | Status: DC | PRN

## 2018-04-01 MED ORDER — PHENOBARBITAL 30 MG PO TABS: 30.0000 mg | ORAL_TABLET | Freq: Every day | ORAL | 0 refills | Status: DC

## 2018-04-01 MED ORDER — PHENOBARBITAL 16.2 MG PO TABS: 16.2000 mg | ORAL_TABLET | Freq: Every day | ORAL | 0 refills | Status: DC

## 2018-04-01 NOTE — Telephone Encounter (Signed)
Form completed,faxed,confirmation received and scanned into patient's chart..Nahla Lukin Lynetta, CMA  

## 2018-04-01 NOTE — Progress Notes (Signed)
Subjective:    CC: medication management  HPI:  63-year-old male is here today for follow-up seizure disorder.  We have been trying to slowly wean his phenobarbital as he has not had any type of seizure activity in well over 20 years.  Been slowly weaning his dose over the last 6 months.  His caregiver reports that he has not had any signs or symptoms of recurrent seizure and has overall actually been doing well.  Hypertension-  Taking meds as directed w/o problems.  Denies medication side effects.    F/U CKD 3 - due ot recheck renal function   GERD -the pharmacist has done a review of his medications and send a note to me today.  They are recommending that we try decreasing his omeprazole down to 20 mg from 40 mg.  Past medical history, Surgical history, Family history not pertinant except as noted below, Social history, Allergies, and medications have been entered into the medical record, reviewed, and corrections made.   Review of Systems: No fevers, chills, night sweats, weight loss, chest pain, or shortness of breath.   Objective:    General: Well Developed, well nourished, and in no acute distress.  Neuro: Alert and oriented x3, extra-ocular muscles intact, sensation grossly intact.  HEENT: Normocephalic, atraumatic  Skin: Warm and dry, no rashes. Cardiac: Regular rate and rhythm, no murmurs rubs or gallops, no lower extremity edema.  Respiratory: Clear to auscultation bilaterally. Not using accessory muscles, speaking in full sentences.   Impression and Recommendations:   HTN - Well controlled. Continue current regimen. Follow up in  4 months.    CKD 3 - due to recheck renal function.   Seizure D/O - doing well.  Continue to wean phenobarbital.  Will decrease noon dose down to 16 and decrease evening dose down to 30 mg.  Follow-up in 4 months.  He will be due for refills before then but the plan will be to head and decrease his dose again as long as he has not had any seizure  activity.  GERD-We will decrease omeprazole down to 20 mg.  Just monitor for increased reflux, abdominal pain, decreased appetite and or increase in cough which can all be a signs of worsening reflux.

## 2018-06-08 ENCOUNTER — Other Ambulatory Visit: Payer: Self-pay | Admitting: Family Medicine

## 2018-06-08 DIAGNOSIS — G40909 Epilepsy, unspecified, not intractable, without status epilepticus: Secondary | ICD-10-CM

## 2018-07-09 ENCOUNTER — Other Ambulatory Visit: Payer: Self-pay | Admitting: Family Medicine

## 2018-07-09 DIAGNOSIS — G40909 Epilepsy, unspecified, not intractable, without status epilepticus: Secondary | ICD-10-CM

## 2018-07-12 ENCOUNTER — Other Ambulatory Visit: Payer: Self-pay | Admitting: *Deleted

## 2018-07-12 DIAGNOSIS — G40909 Epilepsy, unspecified, not intractable, without status epilepticus: Secondary | ICD-10-CM

## 2018-07-12 MED ORDER — PHENOBARBITAL 16.2 MG PO TABS: 16.2000 mg | ORAL_TABLET | Freq: Every day | ORAL | 1 refills | Status: DC

## 2018-07-12 NOTE — Telephone Encounter (Signed)
Spoke w/[name] she reports that he is doing well, no seizures. She also asked about what they should do about his cough. They have been giving him Robitussin DM for this but it doesn't seem that it has really helped. She was told to reach out to [place] about this if it continued. I asked about other sxs. She stated that he has not had any fevers,but he has had a runny nose. She stated that non of the residents go outside since this pandemic. I told her that it could be the change of weather and pollen increase. Does not currently take any allergy medications, he is on a PPI. Will fwd to pcp for advice.Laureen Ochs, Viann Shove, CMA

## 2018-07-12 NOTE — Telephone Encounter (Signed)
Pharmacy advised  

## 2018-07-12 NOTE — Telephone Encounter (Signed)
Did you want the prescription for the phenobarbital 16.5 mg changed to once daily instead of twice daily?

## 2018-07-12 NOTE — Telephone Encounter (Signed)
Please call and find out if still doing well and see if has had any seizures. If not, I will continue to reduce his dose on hsi medication.

## 2018-07-12 NOTE — Telephone Encounter (Signed)
Yes, sorry. Resent RX so says only once a day at bedtime.

## 2018-07-12 NOTE — Telephone Encounter (Signed)
Ok, let have him stop the Robitussin. We may have to send an order. Start Clariting 10mg  daily. New rx sent for phenobarb for once a day.

## 2018-08-03 ENCOUNTER — Ambulatory Visit (INDEPENDENT_AMBULATORY_CARE_PROVIDER_SITE_OTHER): Payer: Medicare Other | Admitting: Family Medicine

## 2018-08-03 ENCOUNTER — Encounter: Payer: Self-pay | Admitting: Family Medicine

## 2018-08-03 VITALS — BP 100/65 | Temp 97.9°F | Ht 72.0 in | Wt 180.0 lb

## 2018-08-03 DIAGNOSIS — G40909 Epilepsy, unspecified, not intractable, without status epilepticus: Secondary | ICD-10-CM | POA: Diagnosis not present

## 2018-08-03 DIAGNOSIS — K219 Gastro-esophageal reflux disease without esophagitis: Secondary | ICD-10-CM

## 2018-08-03 DIAGNOSIS — F5101 Primary insomnia: Secondary | ICD-10-CM

## 2018-08-03 NOTE — Progress Notes (Signed)
Spoke w/[name] she reports there have not been any changes with Richard [name].

## 2018-08-03 NOTE — Patient Instructions (Addendum)
New Orders:  #1 discontinue omeprazole. #2 start omeprazole 20 mg by mouth every other day for 4 weeks, then decrease to 1 every 3 days for 4 weeks, then discontinue. #3 discontinue phenobarbital on Aug 09, 2018 #4 discontinue melatonin #5 discontinue trazodone #6 start Rozerem orally 8 mg at 9 PM nightly.

## 2018-08-03 NOTE — Progress Notes (Signed)
Virtual Visit via Video Note  I connected with Richard [name] on 08/03/18 at 10:30 AM EDT by a video enabled telemedicine application and verified that I am speaking with the correct person using two identifiers.   I discussed the limitations of evaluation and management by telemedicine and the availability of in person appointments. The patient expressed understanding and agreed to proceed.  Was in his group home.  I was able to see him on the video screen he is nonverbal so [place] the Production designer, theatre/television/film there spoke on his behalf.  Subjective:    CC:   HPI: Follow-up seizure disorder-we have been weaning his phenobarbital down slowly over several months.  He is now on 16.2 mg once a day at bedtime.  He has been on that for about 3-1/2 weeks.  This is his 26-month follow-up.  Thus far he is done great and has not had any return of any seizure activity.  He evidently was diagnosed over 20 years ago and has not had a seizure that anyone is aware of during that timeframe.  GERD-we decreased his omeprazole down from 40 mg to 20 milligrams based on the pharmacist recommendation.  He is actually been tolerating that change really well without any noticeable increase in reflux symptoms.  Insomnia-he still not sleeping well.  He is currently taking melatonin 3 mg at bedtime as well as trazodone 100 mg between 8:52 PM nightly.  It still take several hours for him to go down to sleep and then oftentimes he will wake up in the middle the night and not go back to sleep.   Past medical history, Surgical history, Family history not pertinant except as noted below, Social history, Allergies, and medications have been entered into the medical record, reviewed, and corrections made.   Review of Systems: No fevers, chills, night sweats, weight loss, chest pain, or shortness of breath.   Objective:    General: Speaking clearly in complete sentences without any shortness of breath.  Alert and oriented x3.  Normal judgment.  No apparent acute distress.  He was well-groomed and smiling.  He was walking around.     Impression and Recommendations:    Seizure disorder-doing really well.  We have been slowly weaning his medication and he is tolerated it fantastically.  We will continue the phenobarbital through May 11 and then discontinue completely.   GERD -we can try to continue to wean the omeprazole.  We can try decreasing the dose to every other day, and taper gradually over the next several months since he has been on it for years with the goal of eventually getting him completely off.  We will start with 1 tab every other day for 4 weeks and then 1 every 3 days for 4 weeks and then discontinue completely.  Insomnia-discontinue melatonin as well as the trazodone since it sounds like it is really not been very effective.  We will try Rozerem instead.  I really want to stay away from schedule drugs.  We will start with 8 mg nightly and we will can try this for the next 1 to 2 months.  Avoid caffeine and stimulating products.   I discussed the assessment and treatment plan with the patient. The patient was provided an opportunity to ask questions and all were answered. The patient agreed with the plan and demonstrated an understanding of the instructions.   The patient was advised to call back or seek an in-person evaluation if the symptoms worsen or if the condition  fails to improve as anticipated.   Beatrice Lecher, MD

## 2018-08-09 ENCOUNTER — Telehealth: Payer: Self-pay

## 2018-08-09 ENCOUNTER — Encounter: Payer: Self-pay | Admitting: Family Medicine

## 2018-08-09 ENCOUNTER — Ambulatory Visit (INDEPENDENT_AMBULATORY_CARE_PROVIDER_SITE_OTHER): Payer: Medicare Other | Admitting: Family Medicine

## 2018-08-09 VITALS — BP 130/90 | Temp 97.5°F | Ht 72.0 in | Wt 177.0 lb

## 2018-08-09 DIAGNOSIS — J019 Acute sinusitis, unspecified: Secondary | ICD-10-CM

## 2018-08-09 MED ORDER — AMOXICILLIN 875 MG PO TABS: 875.0000 mg | ORAL_TABLET | Freq: Two times a day (BID) | ORAL | 0 refills | Status: DC

## 2018-08-09 MED ORDER — RAMELTEON 8 MG PO TABS: 8.0000 mg | ORAL_TABLET | Freq: Every day | ORAL | 5 refills | Status: DC

## 2018-08-09 NOTE — Telephone Encounter (Signed)
Pharmacy advised  

## 2018-08-09 NOTE — Progress Notes (Signed)
D/c order for melatonin and trazodone. Rozerem was not delivered did not see on medication list. Will need to send to his pharmacy today  sxs began on wed, thurs not eating, lunch dinner (10%), Friday he ate minimal amounts of his food.  When they were brushing his teeth Saturday he was grimacing wasn't sure if this was because his pain was due to teeth pain or sore throat since he hadn't been eating.  Cough dry loud, runny nose. Laureen Ochs, Viann Shove, CMA

## 2018-08-09 NOTE — Telephone Encounter (Signed)
Received call from pharmacy stating that pt was given Amoxicillin today and pharmacy has Floxapen noted as an allergy. Wanting to know if ok to dispense.   CB # [phone]

## 2018-08-09 NOTE — Progress Notes (Signed)
Virtual Visit via Video Note  I connected with Richard [name] on 08/09/18 at 10:10 AM EDT by a video enabled telemedicine application and verified that I am speaking with the correct person using two identifiers.   I discussed the limitations of evaluation and management by telemedicine and the availability of in person appointments. The patient expressed understanding and agreed to proceed.  Subjective:    CC: Cough   HPI:   sxs began on wed, thurs not eating, lunch dinner (10%), Friday he ate minimal amounts of his food. When they were brushing his teeth Saturday he was grimacing wasn't sure if this was because his pain was due to teeth pain or sore throat since he hadn't been eating nearly as much though he did eat his breakfast this morning but did not eat his dinner at all last night.  Nasal discharge has been clear.  Cough dry and loud, + runny nose. No diarrhea.  They have checked his temperature daily and he has not run a temperature.  He has been using some Chloraseptic spray and Robitussin.   Past medical history, Surgical history, Family history not pertinant except as noted below, Social history, Allergies, and medications have been entered into the medical record, reviewed, and corrections made.   Review of Systems: No fevers, chills, night sweats, weight loss, chest pain, or shortness of breath.   Objective:    General: Speaking clearly in complete sentences without any shortness of breath.  Alert and oriented x3.  Normal judgment. No apparent acute distress.  Can see some clear nasal discharge on the video screen.    Impression and Recommendations:    Cough and runny nose -suspect may be viral but he has had symptoms for 6 days and is still not really wanting to eat well.  We will go ahead and treat for sinusitis with amoxicillin.  Call if not better by the end of the week.  Also consider allergies though I think less likely especially with a significant decrease in  appetite.  Insomnia -we will resend prescription for Rozerem.  The pharmacy never received it.2     I discussed the assessment and treatment plan with the patient. The patient was provided an opportunity to ask questions and all were answered. The patient agreed with the plan and demonstrated an understanding of the instructions.   The patient was advised to call back or seek an in-person evaluation if the symptoms worsen or if the condition fails to improve as anticipated.   [name], MD

## 2018-08-09 NOTE — Telephone Encounter (Signed)
Ok to fill 

## 2018-08-24 ENCOUNTER — Other Ambulatory Visit: Payer: Self-pay | Admitting: Family Medicine

## 2018-08-24 DIAGNOSIS — K219 Gastro-esophageal reflux disease without esophagitis: Secondary | ICD-10-CM

## 2018-08-24 MED ORDER — OMEPRAZOLE 20 MG PO CPDR: DELAYED_RELEASE_CAPSULE | ORAL | 0 refills | Status: DC

## 2018-09-09 ENCOUNTER — Telehealth: Payer: Self-pay | Admitting: Family Medicine

## 2018-09-09 MED ORDER — DOXEPIN HCL 10 MG PO CAPS: 10.0000 mg | ORAL_CAPSULE | Freq: Every day | ORAL | 3 refills | Status: DC

## 2018-09-09 NOTE — Telephone Encounter (Signed)
Richard [name] calls and states that the Rozeerem 8mg  that you put the patient on is not working, states he is not sleeping at all. Please advise

## 2018-09-09 NOTE — Telephone Encounter (Signed)
Is a great question.  It is a capsule.  So we may have to call the pharmacy to find out if he chews the capsule if it can affect the release of the medication.  If it does then there is a liquid available.  Do think the capsules are less expensive in the long run though.  Would see if we can call 1 of the local pharmacies or [place] and ask them.

## 2018-09-09 NOTE — Telephone Encounter (Signed)
Okay to discontinue Rozerem and we can try doxepin.

## 2018-09-09 NOTE — Telephone Encounter (Signed)
[name] advised of recommendations. She states Richard [name] doesn't swallow his pills he chews them. She wanted to know if it would be ok to chew the doxepin instead of chewing. She was wondering if maybe him chewing the Rozerem changed how well it works. Please advise.

## 2018-09-10 ENCOUNTER — Other Ambulatory Visit: Payer: Self-pay | Admitting: Family Medicine

## 2018-09-10 DIAGNOSIS — G40909 Epilepsy, unspecified, not intractable, without status epilepticus: Secondary | ICD-10-CM

## 2018-09-10 NOTE — Telephone Encounter (Signed)
Spoke with Matt at [facility], and he said there are not any restrictions on opening the capsule and putting in food.  He did say that the liquid would be more expensive, and they would have to order it.

## 2018-09-10 NOTE — Telephone Encounter (Signed)
Notified patient, was told that medication was delivered last night.

## 2018-09-17 ENCOUNTER — Other Ambulatory Visit: Payer: Self-pay | Admitting: Family Medicine

## 2018-09-17 DIAGNOSIS — G40909 Epilepsy, unspecified, not intractable, without status epilepticus: Secondary | ICD-10-CM

## 2018-09-17 NOTE — Telephone Encounter (Signed)
Looks like this was sent not too long ago w/ 11 refills, can we confirm he actually needs it? Or is this a different pharmacy?

## 2018-09-24 ENCOUNTER — Other Ambulatory Visit: Payer: Self-pay

## 2018-09-24 DIAGNOSIS — K219 Gastro-esophageal reflux disease without esophagitis: Secondary | ICD-10-CM

## 2018-09-24 DIAGNOSIS — G40909 Epilepsy, unspecified, not intractable, without status epilepticus: Secondary | ICD-10-CM

## 2018-09-24 MED ORDER — OMEPRAZOLE 20 MG PO CPDR: DELAYED_RELEASE_CAPSULE | ORAL | 0 refills | Status: DC

## 2018-09-24 NOTE — Telephone Encounter (Signed)
Spoke w/Kristen advised that according to Dr. Gardiner Ramus orders that she faxed he was to have d/c'd the Phenobarbital. Pt's medication record for the facility will be updated to reflect the changes.  She Cyril Mourning) faxed over the orders to the facility to update this.Richard [name]

## 2018-09-24 NOTE — Telephone Encounter (Signed)
These were the orders I faxed to the group home last time:  New Orders:  #1 discontinue omeprazole. #2 start omeprazole 20 mg by mouth every other day for 4 weeks, then decrease to 1 every 3 days for 4 weeks, then discontinue. #3 discontinue phenobarbital on Aug 09, 2018 #4 discontinue melatonin #5 discontinue trazodone #6 start Rozerem orally 8 mg at 9 PM nightly.    He was supposed to stop the medication on May 11th.

## 2018-09-24 NOTE — Telephone Encounter (Signed)
Richard [name] from group home calls stating patient does not have any more refills for Phenobarbital and will need some tonight. RX pended

## 2018-11-15 DIAGNOSIS — N183 Chronic kidney disease, stage 3 (moderate): Secondary | ICD-10-CM | POA: Diagnosis not present

## 2018-12-07 ENCOUNTER — Ambulatory Visit: Payer: Medicare Other | Admitting: Family Medicine

## 2018-12-07 NOTE — Assessment & Plan Note (Deleted)
So far has successfully weaned from phenobarbital completely and is asymptomatic.

## 2018-12-07 NOTE — Progress Notes (Deleted)
Established Patient Office Visit  Subjective:  Patient ID: Richard [name], male    DOB: [birth year]  Age: 63 y.o. MRN: [id number]  CC: No chief complaint on file.   HPI Richard [name] presents for   F/U GERD -we have been slowly trying to taper down his PPI and get him completely off.  F/U Insomnia -previously on a combination of melatonin and trazodone and it was not effective.  So tried switching him to Rozerem.  8 mg of Rozerem was also ineffective so we have recently switched him to doxepin about 2 and half to 3 months ago.  Follow-up seizure disorder-we have completely weaned him off of his phenobarbital.  Past Medical History:  Diagnosis Date  . Autism spectrum disorder   . Bowel obstruction (HCC)   . Broken leg Left  . CKD (chronic kidney disease) stage 3, GFR 30-59 ml/min (HCC) 11/07/2013  . Detached retina   . Hypercholesteremia   . Hyperlipidemia   . Malnutrition (HCC)   . Mental retardation   . Osteoporosis   . Thyroid disease     Past Surgical History:  Procedure Laterality Date  . ABDOMINAL SURGERY    . APPENDECTOMY    . EYE SURGERY Left 03-14-2013   catatract  . LEG SURGERY      Family History  Problem Relation Age of Onset  . Hypertension Other   . Diabetes Other   . Hyperlipidemia Other   . Scoliosis Other     Social History   Socioeconomic History  . Marital status: Single    Spouse name: Not on file  . Number of children: Not on file  . Years of education: Not on file  . Highest education level: Not on file  Occupational History  . Not on file  Social Needs  . Financial resource strain: Not on file  . Food insecurity    Worry: Not on file    Inability: Not on file  . Transportation needs    Medical: Not on file    Non-medical: Not on file  Tobacco Use  . Smoking status: Never Smoker  . Smokeless tobacco: Never Used  Substance and Sexual Activity  . Alcohol use: No  . Drug use: No  . Sexual activity: Never  Lifestyle  .  Physical activity    Days per week: Not on file    Minutes per session: Not on file  . Stress: Not on file  Relationships  . Social Musician on phone: Not on file    Gets together: Not on file    Attends religious service: Not on file    Active member of club or organization: Not on file    Attends meetings of clubs or organizations: Not on file    Relationship status: Not on file  . Intimate partner violence    Fear of current or ex partner: Not on file    Emotionally abused: Not on file    Physically abused: Not on file    Forced sexual activity: Not on file  Other Topics Concern  . Not on file  Social History Narrative   Disabled.  Completed 2nd grade.  Lives in a facility here in [place]. Brother lives nearby.      Outpatient Medications Prior to Visit  Medication Sig Dispense Refill  . Calcium Carbonate-Vitamin D 600-400 MG-UNIT tablet Take 1 tablet by mouth daily. 90 tablet 3  . Cholecalciferol (VITAMIN D3) 10000 units TABS Take 1 tablet by  mouth daily.    Marland Kitchen. docusate sodium (COLACE) 100 MG capsule Take 1 capsule (100 mg total) by mouth 2 (two) times daily. 180 capsule 3  . doxepin (SINEQUAN) 10 MG capsule Take 1 capsule (10 mg total) by mouth at bedtime. Take 30 min before bedtime. 30 capsule 3  . Multiple Vitamins-Minerals (CERTAVITE/ANTIOXIDANTS PO) Take 1 tablet by mouth daily.    Marland Kitchen. omeprazole (PRILOSEC) 20 MG capsule 20 mg by mouth every other day for 4 weeks, then decrease to 1 every 3 days for 4 weeks, then discontinue. 90 capsule 0  . polyethylene glycol powder (GLYCOLAX/MIRALAX) powder Take 17 g by mouth daily. 850 g PRN  . simvastatin (ZOCOR) 40 MG tablet Take 1 tablet (40 mg total) by mouth at bedtime. 90 tablet 3  . tamsulosin (FLOMAX) 0.4 MG CAPS capsule TAKE ONE CAPSULE BY MOUTH ONCE DAILY AFTER SUPPER 30 capsule 10   No facility-administered medications prior to visit.     Allergies  Allergen Reactions  . Flucloxacillin Other (See Comments)     Reaction unknown  . Nsaids Other (See Comments)    Only one kidney.    . Phenothiazines Other (See Comments)    Reaction unknown  . Ofloxacin Other (See Comments)    Reaction unknown    ROS Review of Systems    Objective:    Physical Exam  There were no vitals taken for this visit. Wt Readings from Last 3 Encounters:  08/09/18 177 lb (80.3 kg)  08/03/18 180 lb (81.6 kg)  04/01/18 185 lb (83.9 kg)     Health Maintenance Due  Topic Date Due  . COLONOSCOPY  10/14/2007  . INFLUENZA VACCINE  10/30/2018    There are no preventive care reminders to display for this patient.  Lab Results  Component Value Date   TSH 3.06 06/30/2017   Lab Results  Component Value Date   WBC 6.9 02/04/2018   HGB 16.4 02/04/2018   HCT 48.5 02/04/2018   MCV 94.7 02/04/2018   PLT 164 02/04/2018   Lab Results  Component Value Date   NA 142 02/04/2018   K 4.6 02/04/2018   CO2 24 02/04/2018   GLUCOSE 89 02/04/2018   BUN 27 (H) 02/04/2018   CREATININE 1.73 (H) 02/04/2018   BILITOT 0.4 02/04/2018   ALKPHOS 131 (H) 09/21/2017   AST 14 02/04/2018   ALT 10 02/04/2018   PROT 7.2 02/04/2018   ALBUMIN 4.1 09/21/2017   CALCIUM 10.0 02/04/2018   ANIONGAP 8 09/21/2017   Lab Results  Component Value Date   CHOL 212 (H) 02/04/2018   Lab Results  Component Value Date   HDL 47 02/04/2018   Lab Results  Component Value Date   LDLCALC 131 (H) 02/04/2018   Lab Results  Component Value Date   TRIG 203 (H) 02/04/2018   Lab Results  Component Value Date   CHOLHDL 4.5 02/04/2018   No results found for: HGBA1C    Assessment & Plan:   Problem List Items Addressed This Visit      Digestive   Gastroesophageal reflux disease without esophagitis - Primary     Nervous and Auditory   Seizure disorder (HCC)    So far has successfully weaned from phenobarbital completely and is asymptomatic.        Other   Primary insomnia    Has tried melatonin, trazodone, Rozerem       Hyperlipidemia      No orders of the defined types were placed in  this encounter.   Follow-up: No follow-ups on file.    Beatrice Lecher, MD

## 2018-12-07 NOTE — Assessment & Plan Note (Deleted)
Has tried melatonin, trazodone, Rozerem

## 2018-12-20 ENCOUNTER — Ambulatory Visit (INDEPENDENT_AMBULATORY_CARE_PROVIDER_SITE_OTHER): Payer: Medicare Other | Admitting: Family Medicine

## 2018-12-20 ENCOUNTER — Encounter: Payer: Self-pay | Admitting: Family Medicine

## 2018-12-20 VITALS — BP 136/88 | HR 76 | Ht 72.0 in | Wt 187.0 lb

## 2018-12-20 DIAGNOSIS — R059 Cough, unspecified: Secondary | ICD-10-CM

## 2018-12-20 DIAGNOSIS — Z23 Encounter for immunization: Secondary | ICD-10-CM | POA: Diagnosis not present

## 2018-12-20 DIAGNOSIS — G40909 Epilepsy, unspecified, not intractable, without status epilepticus: Secondary | ICD-10-CM

## 2018-12-20 DIAGNOSIS — N183 Chronic kidney disease, stage 3 unspecified: Secondary | ICD-10-CM

## 2018-12-20 DIAGNOSIS — F5101 Primary insomnia: Secondary | ICD-10-CM

## 2018-12-20 DIAGNOSIS — R05 Cough: Secondary | ICD-10-CM | POA: Diagnosis not present

## 2018-12-20 MED ORDER — DOXEPIN HCL 10 MG/ML PO CONC: 5.0000 mg | Freq: Every day | ORAL | 4 refills | Status: DC

## 2018-12-20 NOTE — Assessment & Plan Note (Signed)
Due to recheck labs in November.

## 2018-12-20 NOTE — Patient Instructions (Addendum)
Call back in abo ut 2 weeks in regards to sleep

## 2018-12-20 NOTE — Assessment & Plan Note (Signed)
Discussed options.  Will actually try decreasing the doxepin.  The sedative effect is actually on less medication also try moving the dosing to 10:00 in the afternoon.  Try to minimize naps.

## 2018-12-20 NOTE — Progress Notes (Signed)
Established Patient Office Visit  Subjective:  Patient ID: Richard [name], male    DOB: 01/21/1956  Age: 63 y.o. MRN: [id number]  CC:  Chief Complaint  Patient presents with  . Insomnia  . Gastroesophageal Reflux    HPI Richard [name] presents for insomnia - Still not sleeping well. Has tried melatonin, trazodone, rozerem.  He is now on the 10 mg capsule of doxepin.  He still getting up at night and wandering around he likes to turn lights which is off.  It just makes it difficult because it is very disruptive to his housemates. He often takes a nap for about an hour to an hour and a half in the afternoons.  They try to keep him stimulated but it is been difficult.  Seizure D/O - he hasn't had any more siezures and he has been off of medication for quite some time which is fantastic.    Caretaker also reports that he has had a mild cough for about the last week.  No significant shortness of breath fevers chills or sweats.  He is also had a runny nose.  No sneezing he does not seem to be rubbing at his nose or ears.   Past Medical History:  Diagnosis Date  . Autism spectrum disorder   . Bowel obstruction (HCC)   . Broken leg Left  . CKD (chronic kidney disease) stage 3, GFR 30-59 ml/min (HCC) 11/07/2013  . Detached retina   . Hypercholesteremia   . Hyperlipidemia   . Malnutrition (HCC)   . Mental retardation   . Osteoporosis   . Thyroid disease     Past Surgical History:  Procedure Laterality Date  . ABDOMINAL SURGERY    . APPENDECTOMY    . EYE SURGERY Left 03-14-2013   catatract  . LEG SURGERY      Family History  Problem Relation Age of Onset  . Hypertension Other   . Diabetes Other   . Hyperlipidemia Other   . Scoliosis Other     Social History   Socioeconomic History  . Marital status: Single    Spouse name: Not on file  . Number of children: Not on file  . Years of education: Not on file  . Highest education level: Not on file  Occupational History  .  Not on file  Social Needs  . Financial resource strain: Not on file  . Food insecurity    Worry: Not on file    Inability: Not on file  . Transportation needs    Medical: Not on file    Non-medical: Not on file  Tobacco Use  . Smoking status: Never Smoker  . Smokeless tobacco: Never Used  Substance and Sexual Activity  . Alcohol use: No  . Drug use: No  . Sexual activity: Never  Lifestyle  . Physical activity    Days per week: Not on file    Minutes per session: Not on file  . Stress: Not on file  Relationships  . Social Musician on phone: Not on file    Gets together: Not on file    Attends religious service: Not on file    Active member of club or organization: Not on file    Attends meetings of clubs or organizations: Not on file    Relationship status: Not on file  . Intimate partner violence    Fear of current or ex partner: Not on file    Emotionally abused: Not on file  Physically abused: Not on file    Forced sexual activity: Not on file  Other Topics Concern  . Not on file  Social History Narrative   Disabled.  Completed 2nd grade.  Lives in a facility here in [place]. Brother lives nearby.      Outpatient Medications Prior to Visit  Medication Sig Dispense Refill  . Calcium Carbonate-Vitamin D 600-400 MG-UNIT tablet Take 1 tablet by mouth daily. 90 tablet 3  . Cholecalciferol (VITAMIN D3) 10000 units TABS Take 1 tablet by mouth daily.    Marland Kitchen. docusate sodium (COLACE) 100 MG capsule Take 1 capsule (100 mg total) by mouth 2 (two) times daily. 180 capsule 3  . Multiple Vitamins-Minerals (CERTAVITE/ANTIOXIDANTS PO) Take 1 tablet by mouth daily.    Marland Kitchen. omeprazole (PRILOSEC) 20 MG capsule 20 mg by mouth every other day for 4 weeks, then decrease to 1 every 3 days for 4 weeks, then discontinue. 90 capsule 0  . polyethylene glycol powder (GLYCOLAX/MIRALAX) powder Take 17 g by mouth daily. 850 g PRN  . simvastatin (ZOCOR) 40 MG tablet Take 1 tablet (40 mg total)  by mouth at bedtime. 90 tablet 3  . tamsulosin (FLOMAX) 0.4 MG CAPS capsule TAKE ONE CAPSULE BY MOUTH ONCE DAILY AFTER SUPPER 30 capsule 10  . doxepin (SINEQUAN) 10 MG capsule Take 1 capsule (10 mg total) by mouth at bedtime. Take 30 min before bedtime. 30 capsule 3   No facility-administered medications prior to visit.     Allergies  Allergen Reactions  . Flucloxacillin Other (See Comments)    Reaction unknown  . Nsaids Other (See Comments)    Only one kidney.    . Phenothiazines Other (See Comments)    Reaction unknown  . Ofloxacin Other (See Comments)    Reaction unknown    ROS Review of Systems    Objective:    Physical Exam  Constitutional: He is oriented to person, place, and time. He appears well-developed and well-nourished.  HENT:  Head: Normocephalic and atraumatic.  Left Ear: External ear normal.  Nose: Nose normal.    Unable to visualize the right TM and canal.  Patient was very resistant to me looking in his right ear.  He normally does not like to have his ears touched.  Eyes: Pupils are equal, round, and reactive to light. Conjunctivae and EOM are normal.  Neck: Neck supple. No thyromegaly present.  Cardiovascular: Normal rate and normal heart sounds.  Pulmonary/Chest: Effort normal and breath sounds normal.  Lymphadenopathy:    He has no cervical adenopathy.  Neurological: He is alert and oriented to person, place, and time.  Skin: Skin is warm and dry.  Psychiatric: He has a normal mood and affect.    BP 136/88   Pulse 76   Ht 6' (1.829 m)   Wt 187 lb (84.8 kg)   SpO2 98%   BMI 25.36 kg/m  Wt Readings from Last 3 Encounters:  12/20/18 187 lb (84.8 kg)  08/09/18 177 lb (80.3 kg)  08/03/18 180 lb (81.6 kg)     There are no preventive care reminders to display for this patient.  There are no preventive care reminders to display for this patient.  Lab Results  Component Value Date   TSH 3.06 06/30/2017   Lab Results  Component Value Date    WBC 6.9 02/04/2018   HGB 16.4 02/04/2018   HCT 48.5 02/04/2018   MCV 94.7 02/04/2018   PLT 164 02/04/2018   Lab Results  Component  Value Date   NA 142 02/04/2018   K 4.6 02/04/2018   CO2 24 02/04/2018   GLUCOSE 89 02/04/2018   BUN 27 (H) 02/04/2018   CREATININE 1.73 (H) 02/04/2018   BILITOT 0.4 02/04/2018   ALKPHOS 131 (H) 09/21/2017   AST 14 02/04/2018   ALT 10 02/04/2018   PROT 7.2 02/04/2018   ALBUMIN 4.1 09/21/2017   CALCIUM 10.0 02/04/2018   ANIONGAP 8 09/21/2017   Lab Results  Component Value Date   CHOL 212 (H) 02/04/2018   Lab Results  Component Value Date   HDL 47 02/04/2018   Lab Results  Component Value Date   LDLCALC 131 (H) 02/04/2018   Lab Results  Component Value Date   TRIG 203 (H) 02/04/2018   Lab Results  Component Value Date   CHOLHDL 4.5 02/04/2018   No results found for: HGBA1C    Assessment & Plan:   Problem List Items Addressed This Visit      Nervous and Auditory   Seizure disorder ([place])    Asymptomatic off of medications.        Genitourinary   CKD (chronic kidney disease) stage 3, GFR 30-59 ml/min (HCC) (Chronic)    Due to recheck labs in November.        Other   Primary insomnia - Primary    Discussed options.  Will actually try decreasing the doxepin.  The sedative effect is actually on less medication also try moving the dosing to 10:00 in the afternoon.  Try to minimize naps.       Other Visit Diagnoses    Need for immunization against influenza       Relevant Orders   Flu Vaccine QUAD 36+ mos IM (Completed)   Cough         Cough-lung exam is normal.  Especially with a runny nose most consistent with postnasal drip and possibly seasonal fall allergies.  We discussed possibly a trial of an over-the-counter antihistamine if the cough does not improve in the next week.  If he develops new or worsening symptoms or fever then please let us know  Meds ordered this encounter  Medications  . DISCONTD: doxepin  (SINEQUAN) 10 MG/ML solution    Sig: Take 0.5 mLs (5 mg total) by mouth at bedtime. At 10 o'clock PM    Dispense:  120 mL    Refill:  4  . doxepin (SINEQUAN) 10 MG/ML solution    Sig: Take 0.5 mLs (5 mg total) by mouth at bedtime. At 10 o'clock PM    Dispense:  120 mL    Refill:  4    Follow-up: Return in about 4 months (around 04/21/2019).    Beatrice Lecher, MD

## 2018-12-20 NOTE — Assessment & Plan Note (Signed)
Asymptomatic off of medications.

## 2019-01-27 ENCOUNTER — Telehealth: Payer: Self-pay

## 2019-01-27 MED ORDER — DOXEPIN HCL 10 MG PO CAPS: 10.0000 mg | ORAL_CAPSULE | Freq: Every day | ORAL | 3 refills | Status: DC

## 2019-01-27 NOTE — Telephone Encounter (Signed)
OK. Will send new rx for pill. Ok to D/c the doxepin liquid.

## 2019-01-27 NOTE — Telephone Encounter (Signed)
Received call from [place] with [facility] asking for patient's Doxepin to be changed back to capsule form. She reports that staff is not measuring correctly and patient has run out of medication early two times.   Please advise if OK to change. I have pended RX.

## 2019-02-03 ENCOUNTER — Telehealth: Payer: Self-pay

## 2019-02-03 NOTE — Telephone Encounter (Signed)
Spoke with [employer] from group home. States they faxed a LOC form last week and have not received it back yet. Wanted to follow up.   DO you recall having this form?   Richard [name] CB# [phone]

## 2019-02-04 NOTE — Telephone Encounter (Signed)
[name] will re-fax this

## 2019-02-04 NOTE — Telephone Encounter (Signed)
I haven't seen this form.  Please see if they can just refax and I can try to get it completed today.

## 2019-02-10 NOTE — Telephone Encounter (Signed)
Pretty sure I completed the form.

## 2019-02-10 NOTE — Telephone Encounter (Signed)
Advised Richard [name] this was completed. She will call back if any other needs arise

## 2019-02-10 NOTE — Telephone Encounter (Signed)
Do you recall seeing this?

## 2019-02-15 ENCOUNTER — Ambulatory Visit (INDEPENDENT_AMBULATORY_CARE_PROVIDER_SITE_OTHER): Payer: Medicare Other | Admitting: Family Medicine

## 2019-02-15 ENCOUNTER — Other Ambulatory Visit: Payer: Self-pay

## 2019-02-15 ENCOUNTER — Encounter: Payer: Self-pay | Admitting: Family Medicine

## 2019-02-15 VITALS — BP 135/71 | Temp 97.6°F | Wt 183.0 lb

## 2019-02-15 DIAGNOSIS — R0981 Nasal congestion: Secondary | ICD-10-CM

## 2019-02-15 DIAGNOSIS — Z20822 Contact with and (suspected) exposure to covid-19: Secondary | ICD-10-CM

## 2019-02-15 DIAGNOSIS — Z20828 Contact with and (suspected) exposure to other viral communicable diseases: Secondary | ICD-10-CM

## 2019-02-15 DIAGNOSIS — R059 Cough, unspecified: Secondary | ICD-10-CM

## 2019-02-15 DIAGNOSIS — R05 Cough: Secondary | ICD-10-CM

## 2019-02-15 MED ORDER — PREDNISONE 20 MG PO TABS: 40.0000 mg | ORAL_TABLET | Freq: Every day | ORAL | 0 refills | Status: DC

## 2019-02-15 NOTE — Progress Notes (Signed)
Called the phone rang several times and the line went to a busy signal. Tried several times with the same result.Richard [name], [place]

## 2019-02-15 NOTE — Progress Notes (Addendum)
Virtual Visit via Video Note  I connected with Richard [name] on 02/15/19 at 10:10 AM EST by a video enabled telemedicine application and verified that I am speaking with the correct person using two identifiers.   I discussed the limitations of evaluation and management by telemedicine and the availability of in person appointments. The patient expressed understanding and agreed to proceed.  Subjective:    CC: Cough  HPI: 63 year old male is doing a virtual visit today via his guardian for nasal congestion, runny nose,  and cough that has been going on since the weekend, so for about 5 days.  No fever, chills or sweats.  Good appetite. Has been using some Robitussin up to 4 times a day as needed. No known COVID exposure.  Thought there was a employee at the group home who had a cold last week. That employee was not tested as far as Richard [name] is aware. No GI symptoms such as nausea or vomiting or diarrhea.  He has not really able to tell her if he has any pain anywhere but he is asking to see the doctor which she says he usually does if he is uncomfortable.  Unfortunately they do not have a pulse oximeter.  He has not been running a fever or had any chills or sweats.  His cough is more productive.  He has had a runny nose as well. One other person at the home has had URI sxs as well. One of the workers at the home said she had noticed that he was wheezing a little bit on Saturday but none today.  Past medical history, Surgical history, Family history not pertinant except as noted below, Social history, Allergies, and medications have been entered into the medical record, reviewed, and corrections made.   Review of Systems: No fevers, chills, night sweats, weight loss, chest pain, or shortness of breath.   Objective:    General: not increased work of breathing.  He is sitting on the couch.  Cough sounds somewhat wet.    Impression and Recommendations:   Cough - URI. Consider COVID as a possibility.   Recommend that he get tested for further evaluation.  We discussed possibly using prednisone for respiratory symptoms.  Is difficult that Richard [name] listen to his chest and getting a pulse ox if at any point he seems like he has increased work of breathing or he is getting more short of breath then he needs to be evaluated in person.  They are going to take him to [facility] for testing off of [address].    I discussed the assessment and treatment plan with the patient. The patient was provided an opportunity to ask questions and all were answered. The patient agreed with the plan and demonstrated an understanding of the instructions.   The patient was advised to call back or seek an in-person evaluation if the symptoms worsen or if the condition fails to improve as anticipated.   Richard Lecher, MD

## 2019-02-17 LAB — NOVEL CORONAVIRUS, NAA: SARS-CoV-2, NAA: NOT DETECTED

## 2019-02-18 ENCOUNTER — Telehealth: Payer: Self-pay

## 2019-02-18 NOTE — Telephone Encounter (Signed)
Richard [name] called to let Dr Madilyn Fireman know they did end up going to [place] testing was negative.   FYI to PCP

## 2019-02-18 NOTE — Telephone Encounter (Signed)
Good. Thank you for letting us know. If he is not feeling some better into next week then please let us know.

## 2019-02-21 NOTE — Telephone Encounter (Signed)
Richard [name] called back and the patient is still having a cough that sounds like a barking. She has made a doxemity appointment for 02/22/2019. Best number is [place]. I have placed this in the chart. FYI.

## 2019-02-22 ENCOUNTER — Ambulatory Visit (INDEPENDENT_AMBULATORY_CARE_PROVIDER_SITE_OTHER): Payer: Medicare Other | Admitting: Family Medicine

## 2019-02-22 ENCOUNTER — Encounter: Payer: Self-pay | Admitting: Family Medicine

## 2019-02-22 VITALS — BP 131/77 | Temp 98.2°F | Ht 72.0 in | Wt 185.0 lb

## 2019-02-22 DIAGNOSIS — J329 Chronic sinusitis, unspecified: Secondary | ICD-10-CM | POA: Diagnosis not present

## 2019-02-22 DIAGNOSIS — J40 Bronchitis, not specified as acute or chronic: Secondary | ICD-10-CM

## 2019-02-22 MED ORDER — DOXYCYCLINE HYCLATE 100 MG PO TABS: 100.0000 mg | ORAL_TABLET | Freq: Two times a day (BID) | ORAL | 0 refills | Status: DC

## 2019-02-22 NOTE — Progress Notes (Signed)
Pt's caregiver reports that the cough has actually gotten worse. She states that he sounds like an "old [place]". Nasal drainage is clear. She states that he has not had any f/s/c/n/v/d.  Didn't eat lunch today. However, he did eat his breakfast.

## 2019-02-22 NOTE — Progress Notes (Signed)
Virtual Visit via Video Note  I connected with Richard [name] on 02/22/19 at  2:00 PM EST by a video enabled telemedicine application and verified that I am speaking with the correct person using two identifiers.   I discussed the limitations of evaluation and management by telemedicine and the availability of in person appointments. The patient expressed understanding and agreed to proceed.  Subjective:    CC: Cough  HPI: 63 year old male with MR is following up today for cough.  We did initial virtual visit approximately 7 days ago for nasal congestion, cough and runny nose.  At that point he had already had symptoms for 5 days so he has now had symptoms for a total of 12 days.  No fever or chills.  He did go for Covid testing a week ago and his test was negative.  There were no known exposures in the group home that he lives in.  He still has a productive wet cough and runny nose.  He is not sleeping well because of it.  He has been wheezing intermittently as well he is also had a decreased appetite.  No fever.  No upset stomach such as diarrhea etc.   Past medical history, Surgical history, Family history not pertinant except as noted below, Social history, Allergies, and medications have been entered into the medical record, reviewed, and corrections made.   Review of Systems: No fevers, chills, night sweats, weight loss, chest pain, or shortness of breath.   Objective:    General: Speaking clearly in complete sentences without any shortness of breath.  Alert and oriented x3.  Normal judgment. No apparent acute distress. Sitting on the cough.     Impression and Recommendations:   Sinobronchitis-at this point he has had symptoms for 2 weeks.  Cough still sounds very productive and wet it been hearing some wheezing intermittently still has a runny nose.  Would not go ahead and treat with doxycycline.  He already finished a round of prednisone.  Call if not better in 5 to 7 days.    I  discussed the assessment and treatment plan with the patient. The patient was provided an opportunity to ask questions and all were answered. The patient agreed with the plan and demonstrated an understanding of the instructions.   The patient was advised to call back or seek an in-person evaluation if the symptoms worsen or if the condition fails to improve as anticipated.   Beatrice Lecher, MD

## 2019-03-02 ENCOUNTER — Telehealth: Payer: Self-pay | Admitting: Family Medicine

## 2019-03-02 NOTE — Telephone Encounter (Signed)
Patient has a dental appointment that needs dental clearance. The appointment is for 12/10. Care giver would need dental clearance form completed sometime this week. Patient is feeling a lot better no cough sx since last visit. Please advise.

## 2019-03-03 NOTE — Telephone Encounter (Signed)
Just went through everything in my basket and nothing there.  They may need to refax it. Not sure if dropped off?

## 2019-03-03 NOTE — Telephone Encounter (Signed)
Do we have the form?

## 2019-03-04 NOTE — Telephone Encounter (Signed)
Spoke with Richard [name], patient needs a physical. She will schedule that and bring any forms needed at that time

## 2019-03-10 ENCOUNTER — Ambulatory Visit: Admit: 2019-03-10 | Payer: Medicare Other

## 2019-03-10 SURGERY — DENTAL RESTORATION/EXTRACTION WITH X-RAY
Anesthesia: General

## 2019-04-16 DIAGNOSIS — Z23 Encounter for immunization: Secondary | ICD-10-CM | POA: Diagnosis not present

## 2019-04-21 ENCOUNTER — Other Ambulatory Visit: Payer: Self-pay

## 2019-04-21 ENCOUNTER — Ambulatory Visit (INDEPENDENT_AMBULATORY_CARE_PROVIDER_SITE_OTHER): Payer: Medicare Other | Admitting: Family Medicine

## 2019-04-21 ENCOUNTER — Encounter: Payer: Self-pay | Admitting: Family Medicine

## 2019-04-21 VITALS — BP 137/75 | HR 85 | Ht 72.0 in | Wt 185.0 lb

## 2019-04-21 DIAGNOSIS — Z0000 Encounter for general adult medical examination without abnormal findings: Secondary | ICD-10-CM | POA: Diagnosis not present

## 2019-04-21 DIAGNOSIS — R972 Elevated prostate specific antigen [PSA]: Secondary | ICD-10-CM | POA: Diagnosis not present

## 2019-04-21 DIAGNOSIS — E7800 Pure hypercholesterolemia, unspecified: Secondary | ICD-10-CM

## 2019-04-21 DIAGNOSIS — K5904 Chronic idiopathic constipation: Secondary | ICD-10-CM

## 2019-04-21 DIAGNOSIS — Z8669 Personal history of other diseases of the nervous system and sense organs: Secondary | ICD-10-CM

## 2019-04-21 DIAGNOSIS — N1831 Chronic kidney disease, stage 3a: Secondary | ICD-10-CM

## 2019-04-21 DIAGNOSIS — K219 Gastro-esophageal reflux disease without esophagitis: Secondary | ICD-10-CM

## 2019-04-21 NOTE — Assessment & Plan Note (Signed)
We'll recheck renal function.

## 2019-04-21 NOTE — Assessment & Plan Note (Signed)
Changed problem list to history of seizure disorder.

## 2019-04-21 NOTE — Progress Notes (Signed)
CPE  Established Patient Office Visit  Subjective:  Patient ID: Richard [name], male    DOB: [birth year]  Age: 64 y.o. MRN: [id number]  CC:  Chief Complaint  Patient presents with  . Follow-up    HPI Richard [name] is a 64 year old male with significant MR who currently lives in a group home who presents for CPE.    He is here with the guardian for the group home that he states that.  He is overall doing well.  He has been off of all seizure medication for almost a year now and has actually done really well he has not had any seizure activities in fact his last seizure was more than 18 years ago.  BPH-continuing to use his Flomax it seems to be working well and not having any new urinary problems or concerns.  He has had some sleep issues on and off but lately it seems to be okay.  He is currently on doxepin.  He does have a history of constant chronic constipation but is on a bowel regimen with MiraLAX and that seems to be keeping things well controlled.  He did receive a Materna COVID-19 vaccine on Saturday.  And so far has been tolerating it well.   Past Medical History:  Diagnosis Date  . Autism spectrum disorder   . Bowel obstruction (HCC)   . Broken leg Left  . CKD (chronic kidney disease) stage 3, GFR 30-59 ml/min 11/07/2013  . Detached retina   . Hypercholesteremia   . Hyperlipidemia   . Malnutrition (HCC)   . Mental retardation   . Osteoporosis   . Thyroid disease     Past Surgical History:  Procedure Laterality Date  . ABDOMINAL SURGERY    . APPENDECTOMY    . EYE SURGERY Left 03-14-2013   catatract  . LEG SURGERY      Family History  Problem Relation Age of Onset  . Hypertension Other   . Diabetes Other   . Hyperlipidemia Other   . Scoliosis Other     Social History   Socioeconomic History  . Marital status: Single    Spouse name: Not on file  . Number of children: Not on file  . Years of education: Not on file  . Highest education level: Not on  file  Occupational History  . Not on file  Tobacco Use  . Smoking status: Never Smoker  . Smokeless tobacco: Never Used  Substance and Sexual Activity  . Alcohol use: No  . Drug use: No  . Sexual activity: Never  Other Topics Concern  . Not on file  Social History Narrative   Disabled.  Completed 2nd grade.  Lives in a facility here in [place]. Brother lives nearby.     Social Determinants of Health   Financial Resource Strain:   . Difficulty of Paying Living Expenses: Not on file  Food Insecurity:   . Worried About Programme researcher, broadcasting/film/video in the Last Year: Not on file  . Ran Out of Food in the Last Year: Not on file  Transportation Needs:   . Lack of Transportation (Medical): Not on file  . Lack of Transportation (Non-Medical): Not on file  Physical Activity:   . Days of Exercise per Week: Not on file  . Minutes of Exercise per Session: Not on file  Stress:   . Feeling of Stress : Not on file  Social Connections:   . Frequency of Communication with Friends and Family: Not on file  .  Frequency of Social Gatherings with Friends and Family: Not on file  . Attends Religious Services: Not on file  . Active Member of Clubs or Organizations: Not on file  . Attends Banker Meetings: Not on file  . Marital Status: Not on file  Intimate Partner Violence:   . Fear of Current or Ex-Partner: Not on file  . Emotionally Abused: Not on file  . Physically Abused: Not on file  . Sexually Abused: Not on file    Outpatient Medications Prior to Visit  Medication Sig Dispense Refill  . Calcium Carbonate-Vitamin D 600-400 MG-UNIT tablet Take 1 tablet by mouth daily. 90 tablet 3  . Cholecalciferol (VITAMIN D3) 10000 units TABS Take 1 tablet by mouth daily.    Marland Kitchen docusate sodium (COLACE) 100 MG capsule Take 1 capsule (100 mg total) by mouth 2 (two) times daily. 180 capsule 3  . Multiple Vitamins-Minerals (CERTAVITE/ANTIOXIDANTS PO) Take 1 tablet by mouth daily.    . polyethylene glycol  powder (GLYCOLAX/MIRALAX) powder Take 17 g by mouth daily. 850 g PRN  . simvastatin (ZOCOR) 40 MG tablet Take 1 tablet (40 mg total) by mouth at bedtime. 90 tablet 3  . tamsulosin (FLOMAX) 0.4 MG CAPS capsule TAKE ONE CAPSULE BY MOUTH ONCE DAILY AFTER SUPPER 30 capsule 10  . doxepin (SINEQUAN) 10 MG/ML solution     . doxycycline (VIBRA-TABS) 100 MG tablet Take 1 tablet (100 mg total) by mouth 2 (two) times daily. 14 tablet 0  . omeprazole (PRILOSEC) 20 MG capsule 20 mg by mouth every other day for 4 weeks, then decrease to 1 every 3 days for 4 weeks, then discontinue. 90 capsule 0   No facility-administered medications prior to visit.    Allergies  Allergen Reactions  . Flucloxacillin Other (See Comments)    Reaction unknown  . Nsaids Other (See Comments)    Only one kidney.    . Phenothiazines Other (See Comments)    Reaction unknown  . Ofloxacin Other (See Comments)    Reaction unknown    ROS Review of Systems    Objective:    Physical Exam  Constitutional: He is oriented to person, place, and time. He appears well-developed and well-nourished.  Unable to speak in sentences  HENT:  Head: Normocephalic and atraumatic.  Right Ear: External ear normal.  Left Ear: External ear normal.  Nose: Nose normal.  Mouth/Throat: Oropharynx is clear and moist.  A large amount of cerumen in both ear canals.  Eyes: Pupils are equal, round, and reactive to light. Conjunctivae and EOM are normal.  Neck: No thyromegaly present.  Cardiovascular: Normal rate, regular rhythm, normal heart sounds and intact distal pulses.  Pulmonary/Chest: Effort normal and breath sounds normal.  Abdominal: Soft. Bowel sounds are normal. He exhibits no distension and no mass. There is no abdominal tenderness. There is no rebound and no guarding.  Old incision well-healed.  Mildly tender.  Musculoskeletal:        General: Normal range of motion.     Cervical back: Normal range of motion and neck supple.   Lymphadenopathy:    He has no cervical adenopathy.  Neurological: He is alert and oriented to person, place, and time. He has normal reflexes.  Skin: Skin is warm and dry.  Psychiatric: He has a normal mood and affect. His behavior is normal. Judgment and thought content normal.    BP 137/75   Pulse 85   Ht 6' (1.829 m)   Wt 185 lb (83.9  kg)   SpO2 98%   BMI 25.09 kg/m  Wt Readings from Last 3 Encounters:  04/21/19 185 lb (83.9 kg)  02/22/19 185 lb (83.9 kg)  02/15/19 183 lb (83 kg)     There are no preventive care reminders to display for this patient.  There are no preventive care reminders to display for this patient.  Lab Results  Component Value Date   TSH 3.06 06/30/2017   Lab Results  Component Value Date   WBC 6.9 02/04/2018   HGB 16.4 02/04/2018   HCT 48.5 02/04/2018   MCV 94.7 02/04/2018   PLT 164 02/04/2018   Lab Results  Component Value Date   NA 142 02/04/2018   K 4.6 02/04/2018   CO2 24 02/04/2018   GLUCOSE 89 02/04/2018   BUN 27 (H) 02/04/2018   CREATININE 1.73 (H) 02/04/2018   BILITOT 0.4 02/04/2018   ALKPHOS 131 (H) 09/21/2017   AST 14 02/04/2018   ALT 10 02/04/2018   PROT 7.2 02/04/2018   ALBUMIN 4.1 09/21/2017   CALCIUM 10.0 02/04/2018   ANIONGAP 8 09/21/2017   Lab Results  Component Value Date   CHOL 212 (H) 02/04/2018   Lab Results  Component Value Date   HDL 47 02/04/2018   Lab Results  Component Value Date   LDLCALC 131 (H) 02/04/2018   Lab Results  Component Value Date   TRIG 203 (H) 02/04/2018   Lab Results  Component Value Date   CHOLHDL 4.5 02/04/2018   No results found for: HGBA1C    Assessment & Plan:   Problem List Items Addressed This Visit      Digestive   Gastroesophageal reflux disease without esophagitis    Doing well off of maintenance medication.        Genitourinary   CKD (chronic kidney disease) stage 3, GFR 30-59 ml/min (HCC) (Chronic)    We'll recheck renal function.        Other    Hyperlipidemia    Tolerating statin well.  No side effects.  Due to recheck lipids and liver enzymes.      History of seizure disorder    Changed problem list to history of seizure disorder.      Constipation    We'll continue with MiraLAX regimen       Other Visit Diagnoses    Wellness examination    -  Primary   Relevant Orders   CBC   COMPLETE METABOLIC PANEL WITH GFR   Lipid panel   PSA     Keep up a regular exercise program and make sure you are eating a healthy diet Try to eat 4 servings of dairy a day, or if you are lactose intolerant take a calcium with vitamin D daily.  Due for screening labs. Your vaccines are up to date.  We'll discuss shingles vaccine at next visit since he just had his Covid vaccine this past week.   No orders of the defined types were placed in this encounter.   Follow-up: Return in about 6 months (around 10/19/2019) for medications follow up .    Beatrice Lecher, MD

## 2019-04-21 NOTE — Patient Instructions (Signed)

## 2019-04-21 NOTE — Assessment & Plan Note (Signed)
We'll continue with MiraLAX regimen

## 2019-04-21 NOTE — Assessment & Plan Note (Signed)
Doing well off of maintenance medication.

## 2019-04-21 NOTE — Assessment & Plan Note (Addendum)
Tolerating statin well.  No side effects.  Due to recheck lipids and liver enzymes.

## 2019-04-22 ENCOUNTER — Telehealth: Payer: Self-pay | Admitting: *Deleted

## 2019-04-22 LAB — CBC
HCT: 45.2 % (ref 38.5–50.0)
Hemoglobin: 14.9 g/dL (ref 13.2–17.1)
MCH: 31.4 pg (ref 27.0–33.0)
MCHC: 33 g/dL (ref 32.0–36.0)
MCV: 95.2 fL (ref 80.0–100.0)
MPV: 13.1 fL — ABNORMAL HIGH (ref 7.5–12.5)
Platelets: 212 10*3/uL (ref 140–400)
RBC: 4.75 10*6/uL (ref 4.20–5.80)
RDW: 12.6 % (ref 11.0–15.0)
WBC: 5.5 10*3/uL (ref 3.8–10.8)

## 2019-04-22 LAB — LIPID PANEL
Cholesterol: 167 mg/dL (ref <200)
HDL: 42 mg/dL (ref >=40)
LDL Cholesterol (Calc): 94 mg/dL (calc)
Non-HDL Cholesterol (Calc): 125 mg/dL (calc) (ref <130)
Total CHOL/HDL Ratio: 4 (calc) (ref <5.0)
Triglycerides: 221 mg/dL — ABNORMAL HIGH (ref <150)

## 2019-04-22 LAB — COMPLETE METABOLIC PANEL WITH GFR
AG Ratio: 1.9 (calc) (ref 1.0–2.5)
ALT: 11 U/L (ref 9–46)
AST: 13 U/L (ref 10–35)
Albumin: 4.4 g/dL (ref 3.6–5.1)
Alkaline phosphatase (APISO): 94 U/L (ref 35–144)
BUN/Creatinine Ratio: 15 (calc) (ref 6–22)
BUN: 31 mg/dL — ABNORMAL HIGH (ref 7–25)
CO2: 26 mmol/L (ref 20–32)
Calcium: 9.8 mg/dL (ref 8.6–10.3)
Chloride: 107 mmol/L (ref 98–110)
Creat: 2.11 mg/dL — ABNORMAL HIGH (ref 0.70–1.25)
GFR, Est African American: 38 mL/min/{1.73_m2} — ABNORMAL LOW (ref >=60)
GFR, Est Non African American: 33 mL/min/{1.73_m2} — ABNORMAL LOW (ref >=60)
Globulin: 2.3 g/dL (calc) (ref 1.9–3.7)
Glucose, Bld: 87 mg/dL (ref 65–99)
Potassium: 4.7 mmol/L (ref 3.5–5.3)
Sodium: 142 mmol/L (ref 135–146)
Total Bilirubin: 0.4 mg/dL (ref 0.2–1.2)
Total Protein: 6.7 g/dL (ref 6.1–8.1)

## 2019-04-22 LAB — PSA: PSA: 2.1 ng/mL (ref <=4.0)

## 2019-04-22 NOTE — Telephone Encounter (Signed)
Form completed,faxed,confirmation received and scanned into patient's chart..Malachi Suderman Lynetta, CMA  

## 2019-05-14 DIAGNOSIS — Z23 Encounter for immunization: Secondary | ICD-10-CM | POA: Diagnosis not present

## 2019-06-01 ENCOUNTER — Other Ambulatory Visit: Payer: Self-pay

## 2019-06-01 DIAGNOSIS — N1831 Chronic kidney disease, stage 3a: Secondary | ICD-10-CM

## 2019-06-01 NOTE — Progress Notes (Signed)
Ordered labs per last result note.

## 2019-06-03 LAB — MICROALBUMIN / CREATININE URINE RATIO
Creatinine, Urine: 101 mg/dL (ref 20–320)
Microalb Creat Ratio: 157 mcg/mg creat — ABNORMAL HIGH (ref <30)
Microalb, Ur: 15.9 mg/dL

## 2019-06-03 LAB — BASIC METABOLIC PANEL WITH GFR
BUN/Creatinine Ratio: 14 (calc) (ref 6–22)
BUN: 25 mg/dL (ref 7–25)
CO2: 18 mmol/L — ABNORMAL LOW (ref 20–32)
Calcium: 9.4 mg/dL (ref 8.6–10.3)
Chloride: 110 mmol/L (ref 98–110)
Creat: 1.83 mg/dL — ABNORMAL HIGH (ref 0.70–1.25)
GFR, Est African American: 45 mL/min/{1.73_m2} — ABNORMAL LOW (ref >=60)
GFR, Est Non African American: 39 mL/min/{1.73_m2} — ABNORMAL LOW (ref >=60)
Glucose, Bld: 95 mg/dL (ref 65–139)
Potassium: 5.2 mmol/L (ref 3.5–5.3)
Sodium: 143 mmol/L (ref 135–146)

## 2019-06-06 ENCOUNTER — Ambulatory Visit: Payer: Self-pay | Admitting: Dentistry

## 2019-06-08 ENCOUNTER — Encounter (HOSPITAL_COMMUNITY): Payer: Self-pay | Admitting: *Deleted

## 2019-06-08 ENCOUNTER — Other Ambulatory Visit: Payer: Self-pay

## 2019-06-08 NOTE — Anesthesia Preprocedure Evaluation (Addendum)
Anesthesia Evaluation  Patient identified by MRN, date of birth, ID band Patient confused  General Assessment Comment:At baseline per group home aide at bedside   Reviewed: Allergy & Precautions, NPO status , Patient's Chart, lab work & pertinent test results  History of Anesthesia Complications (+) DIFFICULT AIRWAY and history of anesthetic complications  Airway    Neck ROM: Full   Comment: Unable to perform full airway exam Dental  (+) Dental Advisory Given, Poor Dentition   Pulmonary neg pulmonary ROS,    Pulmonary exam normal        Cardiovascular negative cardio ROS Normal cardiovascular exam     Neuro/Psych Seizures -, Well Controlled,  PSYCHIATRIC DISORDERS  Autism Spectrum Disorder Mental retardation    GI/Hepatic Neg liver ROS, GERD  Controlled,  Endo/Other  negative endocrine ROS  Renal/GU Renal InsufficiencyRenal disease     Musculoskeletal  Scoliosis    Abdominal   Peds  Hematology negative hematology ROS (+)   Anesthesia Other Findings Unable to perform Covid test due to patient's mental state and unwillingness. Will test after patient is under anesthesia  Reproductive/Obstetrics                           Anesthesia Physical Anesthesia Plan  ASA: III  Anesthesia Plan: General   Post-op Pain Management:    Induction: Intravenous  PONV Risk Score and Plan: 2 and Treatment may vary due to age or medical condition, Ondansetron and Midazolam  Airway Management Planned: Nasal ETT and Video Laryngoscope Planned  Additional Equipment: None  Intra-op Plan:   Post-operative Plan: Extubation in OR  Informed Consent: I have reviewed the patients History and Physical, chart, labs and discussed the procedure including the risks, benefits and alternatives for the proposed anesthesia with the patient or authorized representative who has indicated his/her understanding and  acceptance.     Dental advisory given  Plan Discussed with: CRNA and Anesthesiologist  Anesthesia Plan Comments: (Review of anesthesia records in care everywhere indicates history of difficult intubation but states pt was intubated easily with glidescope in 2016. Per note 05/02/14, "Intubated easily with glidescope. Grade 3 view with Miller 2 blade." Plan for nasal intubation with aid of glidescope, but given hx of difficult intubation, may require oral intubation. )      Anesthesia Quick Evaluation

## 2019-06-08 NOTE — Progress Notes (Signed)
Anesthesia Chart Review: Same day workup  Pt has significant MR and resides in a group home.  CKD III followed by nephrology and PCP. Baseline creatine per PCP notes around 1.80.  Remote history of seizures. Per last PCP note 04/21/19, pt has weaned off antiepileptics and done well. Last seizure more than 18 years ago.   Review of anesthesia records in care everywhere indicates history of difficult intubation but states pt was intubated easily with glidescope in 2016. Per note 05/02/14, "Intubated easily with glidescope. Grade 3 view with Hyacinth Meeker 2 blade."  Will need DOS labs and eval.  Zannie Cove [facility]/Anesthesiology Phone [phone] 06/08/2019 1:01 PM

## 2019-06-08 NOTE — Progress Notes (Addendum)
Patient resides at [facility] [phone], spoke with Jeanell Sparrow, who states pt does not have SOB, fever, cough or chest pain.  PCP - Dr [name] Cardiologist - denies  Chest x-ray - denies EKG - denies Stress Test - denies ECHO - 04/03/16 CE  Cardiac Cath - denies  Anesthesia review: Yes  STOP now taking any Aspirin (unless otherwise instructed by your surgeon), Aleve, Naproxen, Ibuprofen, Motrin, Advil, Goody's, BC's, all herbal medications, fish oil, and all vitamins.   Coronavirus Screening Covid test on DOS 06/09/19 Have you experienced the following symptoms:  Cough yes/no: No Fever (>100.58F)  yes/no: No Runny nose yes/no: No Sore throat yes/no: No Difficulty breathing/shortness of breath  yes/no: No  Have you traveled in the last 14 days and where? yes/no: No  Richard [name]  verbalized understanding of instructions that were given via phone.

## 2019-06-09 ENCOUNTER — Encounter (HOSPITAL_COMMUNITY)
Admission: RE | Disposition: A | Discharge status: Home / Self Care | Payer: Self-pay | Source: Home / Self Care | Attending: Dentistry

## 2019-06-09 ENCOUNTER — Ambulatory Visit (HOSPITAL_COMMUNITY)
Admission: RE | Admit: 2019-06-09 | Discharge: 2019-06-09 | Disposition: A | Discharge status: Home / Self Care | Payer: Medicare Other | Attending: Dentistry | Admitting: Dentistry

## 2019-06-09 ENCOUNTER — Ambulatory Visit (HOSPITAL_COMMUNITY): Payer: Medicare Other | Admitting: Anesthesiology

## 2019-06-09 ENCOUNTER — Encounter (HOSPITAL_COMMUNITY): Payer: Self-pay

## 2019-06-09 DIAGNOSIS — K047 Periapical abscess without sinus: Secondary | ICD-10-CM | POA: Insufficient documentation

## 2019-06-09 DIAGNOSIS — E7800 Pure hypercholesterolemia, unspecified: Secondary | ICD-10-CM | POA: Insufficient documentation

## 2019-06-09 DIAGNOSIS — Z20822 Contact with and (suspected) exposure to covid-19: Secondary | ICD-10-CM | POA: Diagnosis not present

## 2019-06-09 DIAGNOSIS — N183 Chronic kidney disease, stage 3 unspecified: Secondary | ICD-10-CM | POA: Insufficient documentation

## 2019-06-09 DIAGNOSIS — K029 Dental caries, unspecified: Secondary | ICD-10-CM | POA: Diagnosis not present

## 2019-06-09 DIAGNOSIS — F79 Unspecified intellectual disabilities: Secondary | ICD-10-CM | POA: Insufficient documentation

## 2019-06-09 DIAGNOSIS — K08109 Complete loss of teeth, unspecified cause, unspecified class: Secondary | ICD-10-CM | POA: Diagnosis not present

## 2019-06-09 DIAGNOSIS — Z79899 Other long term (current) drug therapy: Secondary | ICD-10-CM | POA: Insufficient documentation

## 2019-06-09 DIAGNOSIS — F840 Autistic disorder: Secondary | ICD-10-CM | POA: Insufficient documentation

## 2019-06-09 DIAGNOSIS — E785 Hyperlipidemia, unspecified: Secondary | ICD-10-CM | POA: Insufficient documentation

## 2019-06-09 DIAGNOSIS — R569 Unspecified convulsions: Secondary | ICD-10-CM | POA: Diagnosis not present

## 2019-06-09 HISTORY — PX: DENTAL RESTORATION/EXTRACTION WITH X-RAY: SHX5796

## 2019-06-09 LAB — RESPIRATORY PANEL BY RT PCR (FLU A&B, COVID)
Influenza A by PCR: NEGATIVE
Influenza B by PCR: NEGATIVE
SARS Coronavirus 2 by RT PCR: NEGATIVE

## 2019-06-09 LAB — CBC
HCT: 44.1 % (ref 39.0–52.0)
Hemoglobin: 14.4 g/dL (ref 13.0–17.0)
MCH: 31.2 pg (ref 26.0–34.0)
MCHC: 32.7 g/dL (ref 30.0–36.0)
MCV: 95.5 fL (ref 80.0–100.0)
Platelets: 204 10*3/uL (ref 150–400)
RBC: 4.62 MIL/uL (ref 4.22–5.81)
RDW: 12.9 % (ref 11.5–15.5)
WBC: 5.1 10*3/uL (ref 4.0–10.5)
nRBC: 0 % (ref 0.0–0.2)

## 2019-06-09 LAB — BASIC METABOLIC PANEL
Anion gap: 8 (ref 5–15)
BUN: 23 mg/dL (ref 8–23)
CO2: 24 mmol/L (ref 22–32)
Calcium: 9 mg/dL (ref 8.9–10.3)
Chloride: 107 mmol/L (ref 98–111)
Creatinine, Ser: 1.94 mg/dL — ABNORMAL HIGH (ref 0.61–1.24)
GFR calc Af Amer: 42 mL/min — ABNORMAL LOW (ref >60)
GFR calc non Af Amer: 36 mL/min — ABNORMAL LOW (ref >60)
Glucose, Bld: 89 mg/dL (ref 70–99)
Potassium: 4.1 mmol/L (ref 3.5–5.1)
Sodium: 139 mmol/L (ref 135–145)

## 2019-06-09 SURGERY — DENTAL RESTORATION/EXTRACTION WITH X-RAY
Anesthesia: General | Site: Mouth

## 2019-06-09 MED ORDER — DEXMEDETOMIDINE HCL 200 MCG/2ML IV SOLN
INTRAVENOUS | Status: DC | PRN
  Administered 2019-06-09: 12 ug via INTRAVENOUS
  Administered 2019-06-09: 8 ug via INTRAVENOUS

## 2019-06-09 MED ORDER — OXYMETAZOLINE HCL 0.05 % NA SOLN
NASAL | Status: AC
  Filled 2019-06-09: qty 30

## 2019-06-09 MED ORDER — DEXAMETHASONE SODIUM PHOSPHATE 10 MG/ML IJ SOLN
INTRAMUSCULAR | Status: AC
  Filled 2019-06-09: qty 1

## 2019-06-09 MED ORDER — ONDANSETRON HCL 4 MG/2ML IJ SOLN
INTRAMUSCULAR | Status: DC | PRN
  Administered 2019-06-09: 4 mg via INTRAVENOUS

## 2019-06-09 MED ORDER — LACTATED RINGERS IV SOLN: INTRAVENOUS | Status: DC | PRN

## 2019-06-09 MED ORDER — LIDOCAINE-EPINEPHRINE 2 %-1:100000 IJ SOLN
INTRAMUSCULAR | Status: AC
  Filled 2019-06-09: qty 1

## 2019-06-09 MED ORDER — MIDAZOLAM HCL 5 MG/5ML IJ SOLN
INTRAMUSCULAR | Status: DC | PRN
  Administered 2019-06-09: 2 mg via INTRAVENOUS

## 2019-06-09 MED ORDER — LIDOCAINE 2% (20 MG/ML) 5 ML SYRINGE
INTRAMUSCULAR | Status: AC
  Filled 2019-06-09: qty 5

## 2019-06-09 MED ORDER — ROCURONIUM BROMIDE 10 MG/ML (PF) SYRINGE
PREFILLED_SYRINGE | INTRAVENOUS | Status: AC
  Filled 2019-06-09: qty 10

## 2019-06-09 MED ORDER — PROPOFOL 10 MG/ML IV BOLUS
INTRAVENOUS | Status: AC
  Filled 2019-06-09: qty 20

## 2019-06-09 MED ORDER — GLYCOPYRROLATE PF 0.2 MG/ML IJ SOSY
PREFILLED_SYRINGE | INTRAMUSCULAR | Status: AC
  Filled 2019-06-09: qty 1

## 2019-06-09 MED ORDER — FENTANYL CITRATE (PF) 250 MCG/5ML IJ SOLN
INTRAMUSCULAR | Status: DC | PRN
  Administered 2019-06-09: 100 ug via INTRAVENOUS

## 2019-06-09 MED ORDER — OXYMETAZOLINE HCL 0.05 % NA SOLN
NASAL | Status: DC | PRN
  Administered 2019-06-09: 1

## 2019-06-09 MED ORDER — LIDOCAINE 2% (20 MG/ML) 5 ML SYRINGE
INTRAMUSCULAR | Status: DC | PRN
  Administered 2019-06-09: 60 mg via INTRAVENOUS

## 2019-06-09 MED ORDER — LIDOCAINE-EPINEPHRINE 2 %-1:100000 IJ SOLN
INTRAMUSCULAR | Status: DC | PRN
  Administered 2019-06-09: 5.1 mL via INTRADERMAL

## 2019-06-09 MED ORDER — PROPOFOL 10 MG/ML IV BOLUS
INTRAVENOUS | Status: DC | PRN
  Administered 2019-06-09: 150 mg via INTRAVENOUS

## 2019-06-09 MED ORDER — FENTANYL CITRATE (PF) 250 MCG/5ML IJ SOLN
INTRAMUSCULAR | Status: AC
  Filled 2019-06-09: qty 5

## 2019-06-09 MED ORDER — OXYMETAZOLINE HCL 0.05 % NA SOLN
NASAL | Status: DC | PRN
  Administered 2019-06-09: 2 via NASAL

## 2019-06-09 MED ORDER — ONDANSETRON HCL 4 MG/2ML IJ SOLN
INTRAMUSCULAR | Status: AC
  Filled 2019-06-09: qty 2

## 2019-06-09 MED ORDER — CHLORHEXIDINE GLUCONATE 0.12 % MT SOLN
OROMUCOSAL | Status: DC | PRN
  Administered 2019-06-09: 5 mL via OROMUCOSAL

## 2019-06-09 MED ORDER — ROCURONIUM BROMIDE 10 MG/ML (PF) SYRINGE
PREFILLED_SYRINGE | INTRAVENOUS | Status: DC | PRN
  Administered 2019-06-09: 60 mg via INTRAVENOUS

## 2019-06-09 MED ORDER — SUGAMMADEX SODIUM 200 MG/2ML IV SOLN
INTRAVENOUS | Status: DC | PRN
  Administered 2019-06-09: 200 mg via INTRAVENOUS

## 2019-06-09 MED ORDER — HYDROMORPHONE HCL 1 MG/ML IJ SOLN: 0.2500 mg | INTRAMUSCULAR | Status: DC | PRN

## 2019-06-09 MED ORDER — HEMOSTATIC AGENTS (NO CHARGE) OPTIME
TOPICAL | Status: DC | PRN
  Administered 2019-06-09: 1 via TOPICAL

## 2019-06-09 MED ORDER — BUPIVACAINE-EPINEPHRINE (PF) 0.5% -1:200000 IJ SOLN
INTRAMUSCULAR | Status: AC
  Filled 2019-06-09: qty 18

## 2019-06-09 MED ORDER — LIDOCAINE-EPINEPHRINE 2 %-1:100000 IJ SOLN
INTRAMUSCULAR | Status: AC
  Filled 2019-06-09: qty 17

## 2019-06-09 MED ORDER — MIDAZOLAM HCL 2 MG/2ML IJ SOLN
INTRAMUSCULAR | Status: AC
  Filled 2019-06-09: qty 2

## 2019-06-09 MED ORDER — CHLORHEXIDINE GLUCONATE 0.12 % MT SOLN
OROMUCOSAL | Status: AC
  Filled 2019-06-09: qty 15

## 2019-06-09 MED ORDER — DEXAMETHASONE SODIUM PHOSPHATE 10 MG/ML IJ SOLN
INTRAMUSCULAR | Status: DC | PRN
  Administered 2019-06-09: 10 mg via INTRAVENOUS

## 2019-06-09 SURGICAL SUPPLY — 39 items
BLADE SURG 15 STRL LF DISP TIS (BLADE) IMPLANT
BLADE SURG 15 STRL SS (BLADE) ×1
CANISTER SUCT 3000ML PPV (MISCELLANEOUS) ×2 IMPLANT
COVER BACK TABLE 60X90IN (DRAPES) ×2 IMPLANT
COVER MAYO STAND STRL (DRAPES) ×2 IMPLANT
COVER SURGICAL LIGHT HANDLE (MISCELLANEOUS) ×2 IMPLANT
COVER WAND RF STERILE (DRAPES) ×2 IMPLANT
DECANTER SPIKE VIAL GLASS SM (MISCELLANEOUS) IMPLANT
DRAPE HALF SHEET 40X57 (DRAPES) ×2 IMPLANT
ELECT COATED BLADE 2.86 ST (ELECTRODE) IMPLANT
ELECT REM PT RETURN 9FT ADLT (ELECTROSURGICAL)
ELECTRODE REM PT RTRN 9FT ADLT (ELECTROSURGICAL) IMPLANT
GAUZE 4X4 16PLY RFD (DISPOSABLE) ×2 IMPLANT
GAUZE PACKING FOLDED 2  STR (GAUZE/BANDAGES/DRESSINGS) ×1
GAUZE PACKING FOLDED 2 STR (GAUZE/BANDAGES/DRESSINGS) ×1 IMPLANT
GAUZE SPONGE 4X4 12PLY STRL (GAUZE/BANDAGES/DRESSINGS) ×2 IMPLANT
GLOVE SURG SS PI 8.0 STRL IVOR (GLOVE) ×1 IMPLANT
GOWN STRL REIN XL XLG (GOWN DISPOSABLE) ×1 IMPLANT
GOWN STRL REUS W/ TWL LRG LVL3 (GOWN DISPOSABLE) ×2 IMPLANT
GOWN STRL REUS W/TWL LRG LVL3 (GOWN DISPOSABLE) ×2
KIT BASIN OR (CUSTOM PROCEDURE TRAY) ×2 IMPLANT
KIT TURNOVER KIT B (KITS) ×2 IMPLANT
MANIFOLD NEPTUNE II (INSTRUMENTS) ×2 IMPLANT
NS IRRIG 1000ML POUR BTL (IV SOLUTION) ×2 IMPLANT
PAD ARMBOARD 7.5X6 YLW CONV (MISCELLANEOUS) ×4 IMPLANT
PENCIL BUTTON HOLSTER BLD 10FT (ELECTRODE) IMPLANT
SOL ANTI FOG 6CC (MISCELLANEOUS) ×1 IMPLANT
SOLUTION ANTI FOG 6CC (MISCELLANEOUS) ×1
SPONGE SURGIFOAM ABS GEL 12-7 (HEMOSTASIS) ×1 IMPLANT
SUT CHROMIC 3 0 PS 2 (SUTURE) ×1 IMPLANT
SYR BULB 3OZ (MISCELLANEOUS) ×1 IMPLANT
SYR CONTROL 10ML LL (SYRINGE) ×1 IMPLANT
TOOTHBRUSH ADULT (PERSONAL CARE ITEMS) ×1 IMPLANT
TOWEL GREEN STERILE (TOWEL DISPOSABLE) ×2 IMPLANT
TOWEL GREEN STERILE FF (TOWEL DISPOSABLE) IMPLANT
TUBE CONNECTING 12X1/4 (SUCTIONS) ×2 IMPLANT
WATER STERILE IRR 1000ML POUR (IV SOLUTION) ×4 IMPLANT
WATER TABLETS ICX (MISCELLANEOUS) ×1 IMPLANT
YANKAUER SUCT BULB TIP NO VENT (SUCTIONS) ×2 IMPLANT

## 2019-06-09 NOTE — H&P (Signed)
Richard [name] is an 64 y.o. male.   Chief Complaint: Unable to sit for dental evaluation, imaging and procedures in an outpatient setting HPI: Brief history of guarding upper left side when brushing. No interuption in diet or eating habits.  Past Medical History:  Diagnosis Date  . Autism spectrum disorder   . Bowel obstruction (HCC)   . Broken leg Left  . CKD (chronic kidney disease) stage 3, GFR 30-59 ml/min 11/07/2013   patient has only one kidney  . Detached retina   . Hypercholesteremia   . Hyperlipidemia   . Malnutrition (HCC)   . Mental retardation   . Osteoporosis   . Thyroid disease     Past Surgical History:  Procedure Laterality Date  . ABDOMINAL SURGERY    . APPENDECTOMY    . EYE SURGERY Left 03-14-2013   catatract  . LEG SURGERY Left     Family History  Problem Relation Age of Onset  . Hypertension Other   . Diabetes Other   . Hyperlipidemia Other   . Scoliosis Other    Social History:  reports that he has never smoked. He has never used smokeless tobacco. He reports that he does not drink alcohol or use drugs.  Allergies:  Allergies  Allergen Reactions  . Flucloxacillin Other (See Comments)    Reaction unknown  . Nsaids Other (See Comments)    Only one kidney.    . Phenothiazines Other (See Comments)    Reaction unknown  . Ofloxacin Other (See Comments)    Reaction unknown    Medications Prior to Admission  Medication Sig Dispense Refill  . Calcium Carbonate-Vitamin D 600-400 MG-UNIT tablet Take 1 tablet by mouth daily. 90 tablet 3  . docusate sodium (COLACE) 100 MG capsule Take 1 capsule (100 mg total) by mouth 2 (two) times daily. 180 capsule 3  . doxepin (SINEQUAN) 10 MG capsule Take 10 mg by mouth at bedtime. (2200)    . Multiple Vitamin (MULTIVITAMIN WITH MINERALS) TABS tablet Take 1 tablet by mouth daily. (0800)    . polyethylene glycol powder (GLYCOLAX/MIRALAX) powder Take 17 g by mouth daily. 850 g PRN  . simvastatin (ZOCOR) 40 MG  tablet Take 1 tablet (40 mg total) by mouth at bedtime. (Patient taking differently: Take 40 mg by mouth at bedtime. (2000)) 90 tablet 3  . tamsulosin (FLOMAX) 0.4 MG CAPS capsule TAKE ONE CAPSULE BY MOUTH ONCE DAILY AFTER SUPPER (Patient taking differently: Take 0.4 mg by mouth daily at 6 PM. (1700)) 30 capsule 10  . Vitamin D3 (VITAMIN D) 25 MCG tablet Take 1,000 Units by mouth daily.      No results found for this or any previous visit (from the past 48 hour(s)). No results found.  Review of Systems  Blood pressure (!) 163/99, pulse 74, temperature 98.7 F (37.1 C), temperature source Oral, height 6' (1.829 m), weight 83.5 kg, SpO2 97 %. Physical Exam   Assessment/Plan Dental rehab in the OR - includes imaging, cleaning, restorations and extractions as necessary  Cristino Martes, DMD 06/09/2019, 7:00 AM

## 2019-06-09 NOTE — Brief Op Note (Signed)
06/09/2019  10:45 AM  PATIENT:  Richard [name]  64 y.o. male  PRE-OPERATIVE DIAGNOSIS:  DENTAL CARIES  POST-OPERATIVE DIAGNOSIS:  DENTAL CARIES  PROCEDURE:  Procedure(s): DENTAL RESTORATION/EXTRACTION Teeth 16, 17 WITH X-RAY Diagnostic, Dental Cleaning, Dental Fillings on Teeth 3,10,30,31 (N/A)  SURGEON:  Surgeon(s) and Role:     Phineas Semen, DMD - Primary  PHYSICIAN ASSISTANT:   ASSISTANTS: Rudi Coco   ANESTHESIA:   general  EBL:  10 mL   BLOOD ADMINISTERED:none  DRAINS:  LOCAL MEDICATIONS USED:  LIDOCAINE   SPECIMEN:  No Specimen  DISPOSITION OF SPECIMEN:  N/A  COUNTS:  YES  TOURNIQUET:  * No tourniquets in log *  DICTATION: .Note written in EPIC  PLAN OF CARE: Discharge to home after PACU  PATIENT DISPOSITION:  PACU - hemodynamically stable.   Delay start of Pharmacological VTE agent (>24hrs) due to surgical blood loss or risk of bleeding: no

## 2019-06-09 NOTE — Anesthesia Procedure Notes (Signed)
Procedure Name: Intubation Date/Time: 06/09/2019 8:02 AM Performed by: Ezekiel Ina, CRNA Pre-anesthesia Checklist: Patient identified, Emergency Drugs available, Suction available, Patient being monitored and Timeout performed Patient Re-evaluated:Patient Re-evaluated prior to induction Oxygen Delivery Method: Circle System Utilized Preoxygenation: Pre-oxygenation with 100% oxygen Induction Type: IV induction Ventilation: Oral airway inserted - appropriate to patient size, Two handed mask ventilation required and Mask ventilation with difficulty Laryngoscope Size: Glidescope and 4 Grade View: Grade I Nasal Tubes: Nasal Rae and Nasal prep performed Tube size: 7.5 mm Number of attempts: 1 Airway Equipment and Method: Oral airway Placement Confirmation: ETT inserted through vocal cords under direct vision,  positive ETCO2 and breath sounds checked- equal and bilateral Secured at: 27 cm Tube secured with: Tape Dental Injury: Teeth and Oropharynx as per pre-operative assessment  Difficulty Due To: Difficult Airway- due to dentition, Difficult Airway- due to large tongue and Difficult Airway- due to limited oral opening Comments: Performed by [place] Ingram SRNA

## 2019-06-09 NOTE — Transfer of Care (Signed)
Immediate Anesthesia Transfer of Care Note  Patient: Richard [name]  Procedure(s) Performed: DENTAL RESTORATION/EXTRACTION Teeth 16, 17 WITH X-RAY Diagnostic, Dental Cleaning, Dental Fillings on Teeth 3,10,30,31 (N/A Mouth)  Patient Location: PACU  Anesthesia Type:General  Level of Consciousness: awake and alert   Airway & Oxygen Therapy: Patient Spontanous Breathing and Patient connected to face mask  Post-op Assessment: Report given to RN, Post -op Vital signs reviewed and stable and Patient moving all extremities  Post vital signs: Reviewed and stable  Last Vitals:  Vitals Value Taken Time  BP 152/95 06/09/19 1020  Temp    Pulse 65 06/09/19 1024  Resp 10 06/09/19 1024  SpO2 94 % 06/09/19 1024  Vitals shown include unvalidated device data.  Last Pain:  Vitals:   06/09/19 0700  TempSrc:   PainSc: 0-No pain         Complications: No apparent anesthesia complications

## 2019-06-09 NOTE — Op Note (Signed)
[facility]  06/09/2019 Rise Paganini [id number]  Preop DX: Dental caries/behavior management issues due to intellectual disabilities/developmental disabilities. Dental Care provided in OR for medically necessary treatment.  Surgeon: Cristino Martes, DMD  Assistant: Rudi Coco and hospital staff.  Anesthesia: General  Procedure: The patient was brought into the operating room and placed on the table in a supine position.  General anesthesia was administered via nasal intubation.  The patient was prepped and draped in the usual manner for an intra-oral general dentistry procedure. The oropharynx was suctioned and a moistened oropharyngeal throat pack was placed.    A full intra-oral exam including all hard and soft tissues was performed.  Type of Exam: Comprehensive   Soft Tissue Exam: Floor of the mouth: Normal Buccal mucosa: Normal Soft palate: Normal Hard palate: Normal Tongue: Normal Gingival: Normal Frenum: Normal  Hard tissue exam:  Present: # 2-31 Missing: # 1 and #32 Un-erupted: N/A Radiographic findings decay: # 17 MODBL Radiographic findings abscess: # 17  Full mouth series of digital radiographs taken and reviewed. A comprehensive treatment plan was developed.  Operative care was accomplished in a standard fashion using high/low speed drills with copious irrigation.  Routine extractions were accomplished with simple elevation and use of forceps. Surgical Extractions were done in a standard fashion with full facial thickness flaps to gain access, otectomy / osteoplasty with copious irrigation to expose the teeth. Teeth with multi-roots were sectioned as needed to minimize surgical trauma.  Alveoplasty:No  All surgical sites were irrigated with copious amounts of saline. Gel foam was placed in the sockets and hemostasis established with firm pressure. Surgical sites were closed with 3-0 Chromic sutures.  Local Anes:Lidocaine 2% with 1:100,000  epinephrine 5.1 mls The estimated blood loss was 10 mls.   Upon completion of all procedures the oropharynx was irrigated of all debris. Mouth was suctioned dry and a posterior throat pack was carefully removed with constant suction. Hemostasis was established and a gauze pack was placed as an intraoral pressure dressing. After spontaneous respirations the patient was extubated and transported to the Post-Anesthesia care unit in awake but in a sedated condition. The patient tolerated the procedure well and without complications.  An explanation of procedures and extractions were given to caregiver, Moody Bruins.  Operative Procedures:  Full mouth debridement: Yes Extractions completed: # 16, #17 Amalgams: N/A Composites/Glass Ionomers: Coronal, Chewing #3, Coronal, smooth #10, Coronal, Smooth and Chewing #30, Smooth #31 Fluoride varnish: Yes Gingivectomy: No Alveloplasty: No Crown: # N/A Bridge: # N/A  Postoperative Meds:  Tylenol 325 mg, 2 tablets every 4-6 hours PRN pain  Postoperative Instructions: Extraction sheet signed and given to patient representative.    Cristino Martes, DMD

## 2019-06-09 NOTE — Anesthesia Postprocedure Evaluation (Signed)
Anesthesia Post Note  Patient: Richard [name]  Procedure(s) Performed: DENTAL RESTORATION/EXTRACTION Teeth 16, 17 WITH X-RAY Diagnostic, Dental Cleaning, Dental Fillings on Teeth 3,10,30,31 (N/A Mouth)     Patient location during evaluation: PACU Anesthesia Type: General Level of consciousness: awake (as in preop) Pain management: pain level controlled Vital Signs Assessment: post-procedure vital signs reviewed and stable Respiratory status: spontaneous breathing, nonlabored ventilation and respiratory function stable Cardiovascular status: blood pressure returned to baseline and stable Postop Assessment: no apparent nausea or vomiting Anesthetic complications: no    Last Vitals:  Vitals:   06/09/19 1020 06/09/19 1035  BP: (!) 152/95 (!) 143/87  Pulse: 64 (!) 58  Resp: 11 10  Temp: 36.4 C   SpO2: 98% 93%    Last Pain:  Vitals:   06/09/19 0700  TempSrc:   PainSc: 0-No pain                 Beryle Lathe

## 2019-07-13 ENCOUNTER — Telehealth: Payer: Self-pay

## 2019-07-13 NOTE — Telephone Encounter (Signed)
Paperwork was completed yesterday not sure when it was faxed.

## 2019-07-13 NOTE — Telephone Encounter (Signed)
Richard [name] states she did fax the paperwork. She wanted to know if we received the paperwork.

## 2019-07-13 NOTE — Telephone Encounter (Signed)
Marchelle Folks called and states she needs the paperwork emailed.   [email or web address] [email or web address]

## 2019-07-14 NOTE — Telephone Encounter (Signed)
Gave paperwork to [facility] to email.

## 2019-09-14 ENCOUNTER — Other Ambulatory Visit: Payer: Self-pay | Admitting: *Deleted

## 2019-09-14 MED ORDER — TAMSULOSIN HCL 0.4 MG PO CAPS: ORAL_CAPSULE | ORAL | 10 refills | Status: DC

## 2019-11-07 ENCOUNTER — Other Ambulatory Visit: Payer: Self-pay | Admitting: Family Medicine

## 2019-11-12 IMAGING — DX DG CHEST 2V
2 series · 2 of 2 positions shown · non-contrast
Comparison: 03/27/2015

CLINICAL DATA: Cough, congestion x1 month

EXAM:
CHEST - 2 VIEW

[chest pa]
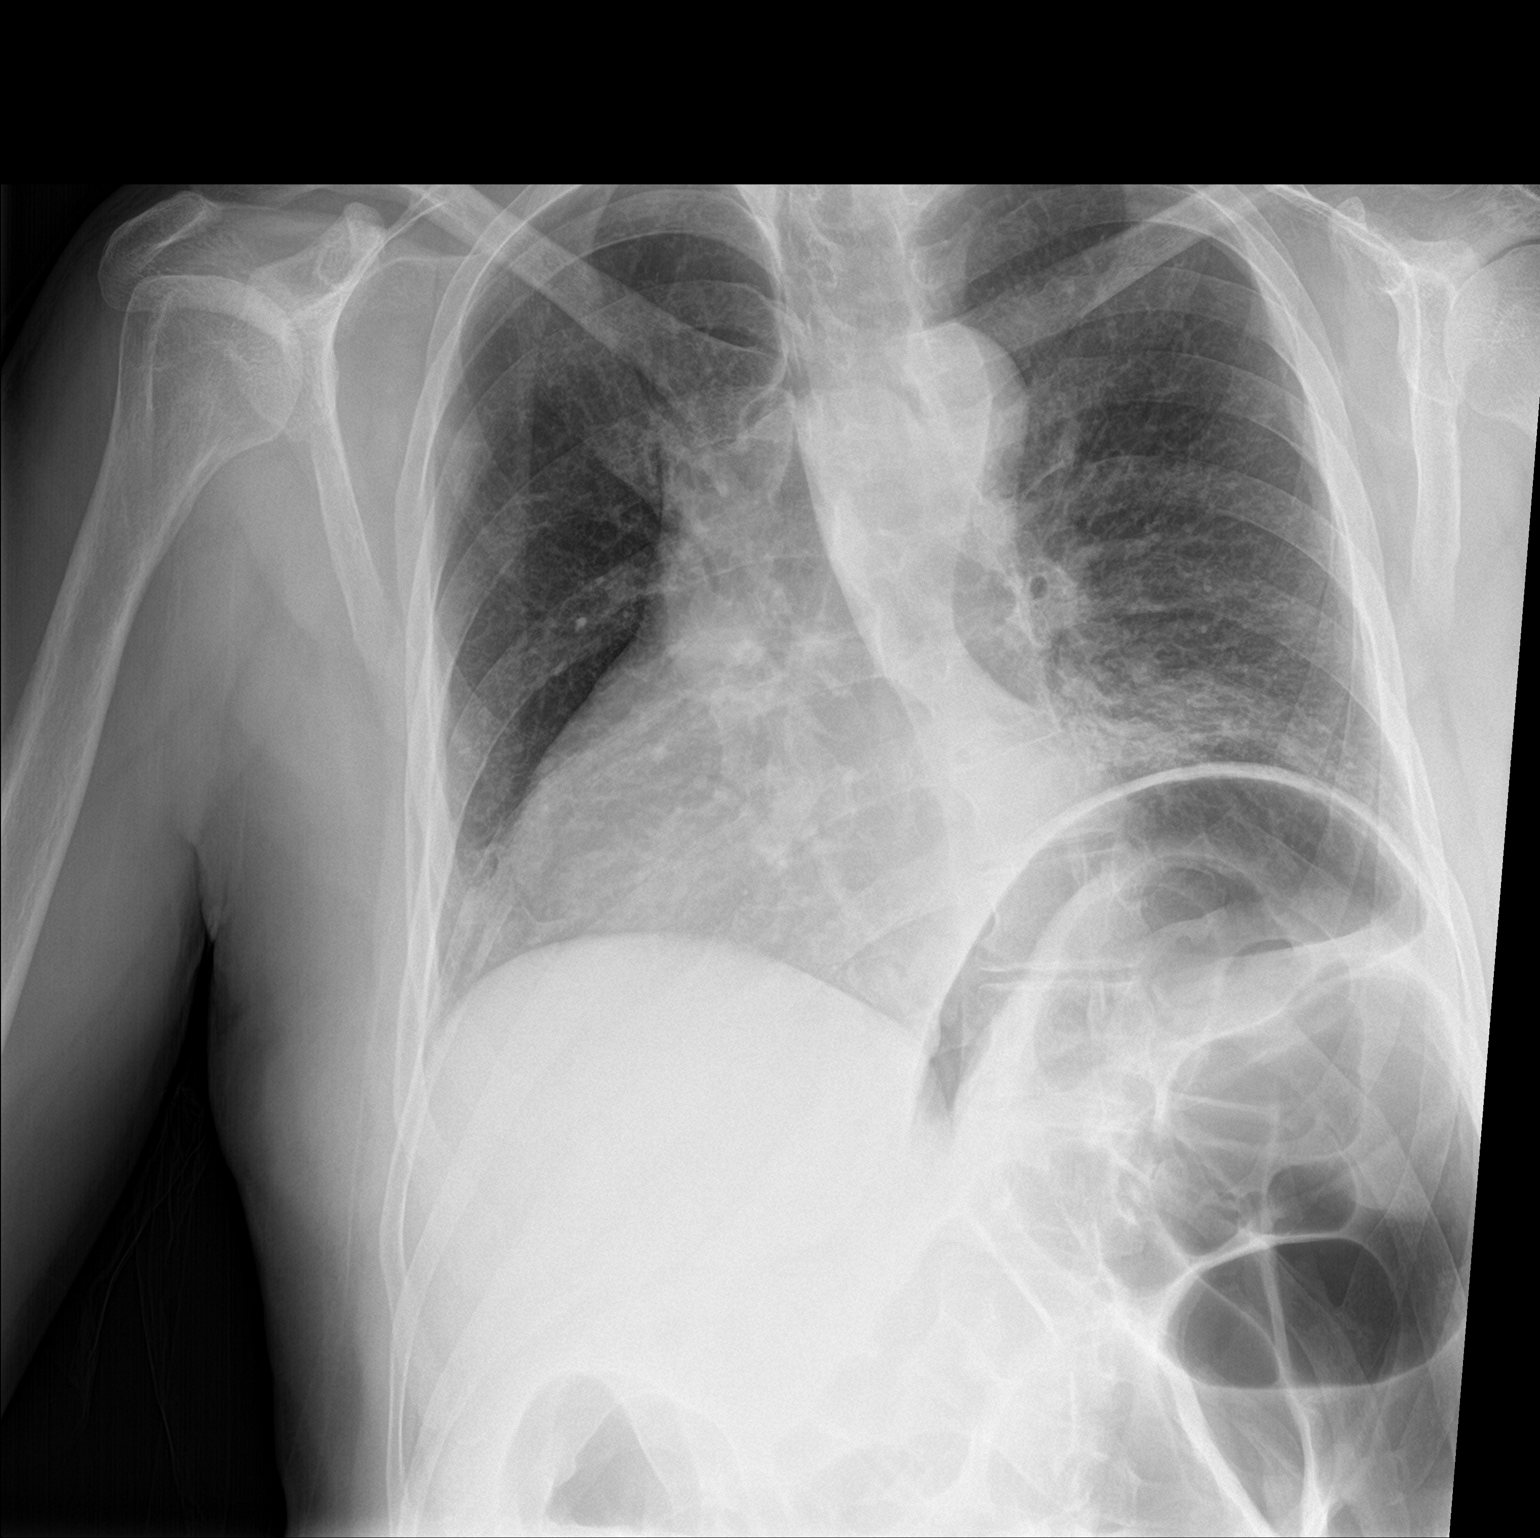

[chest lat]
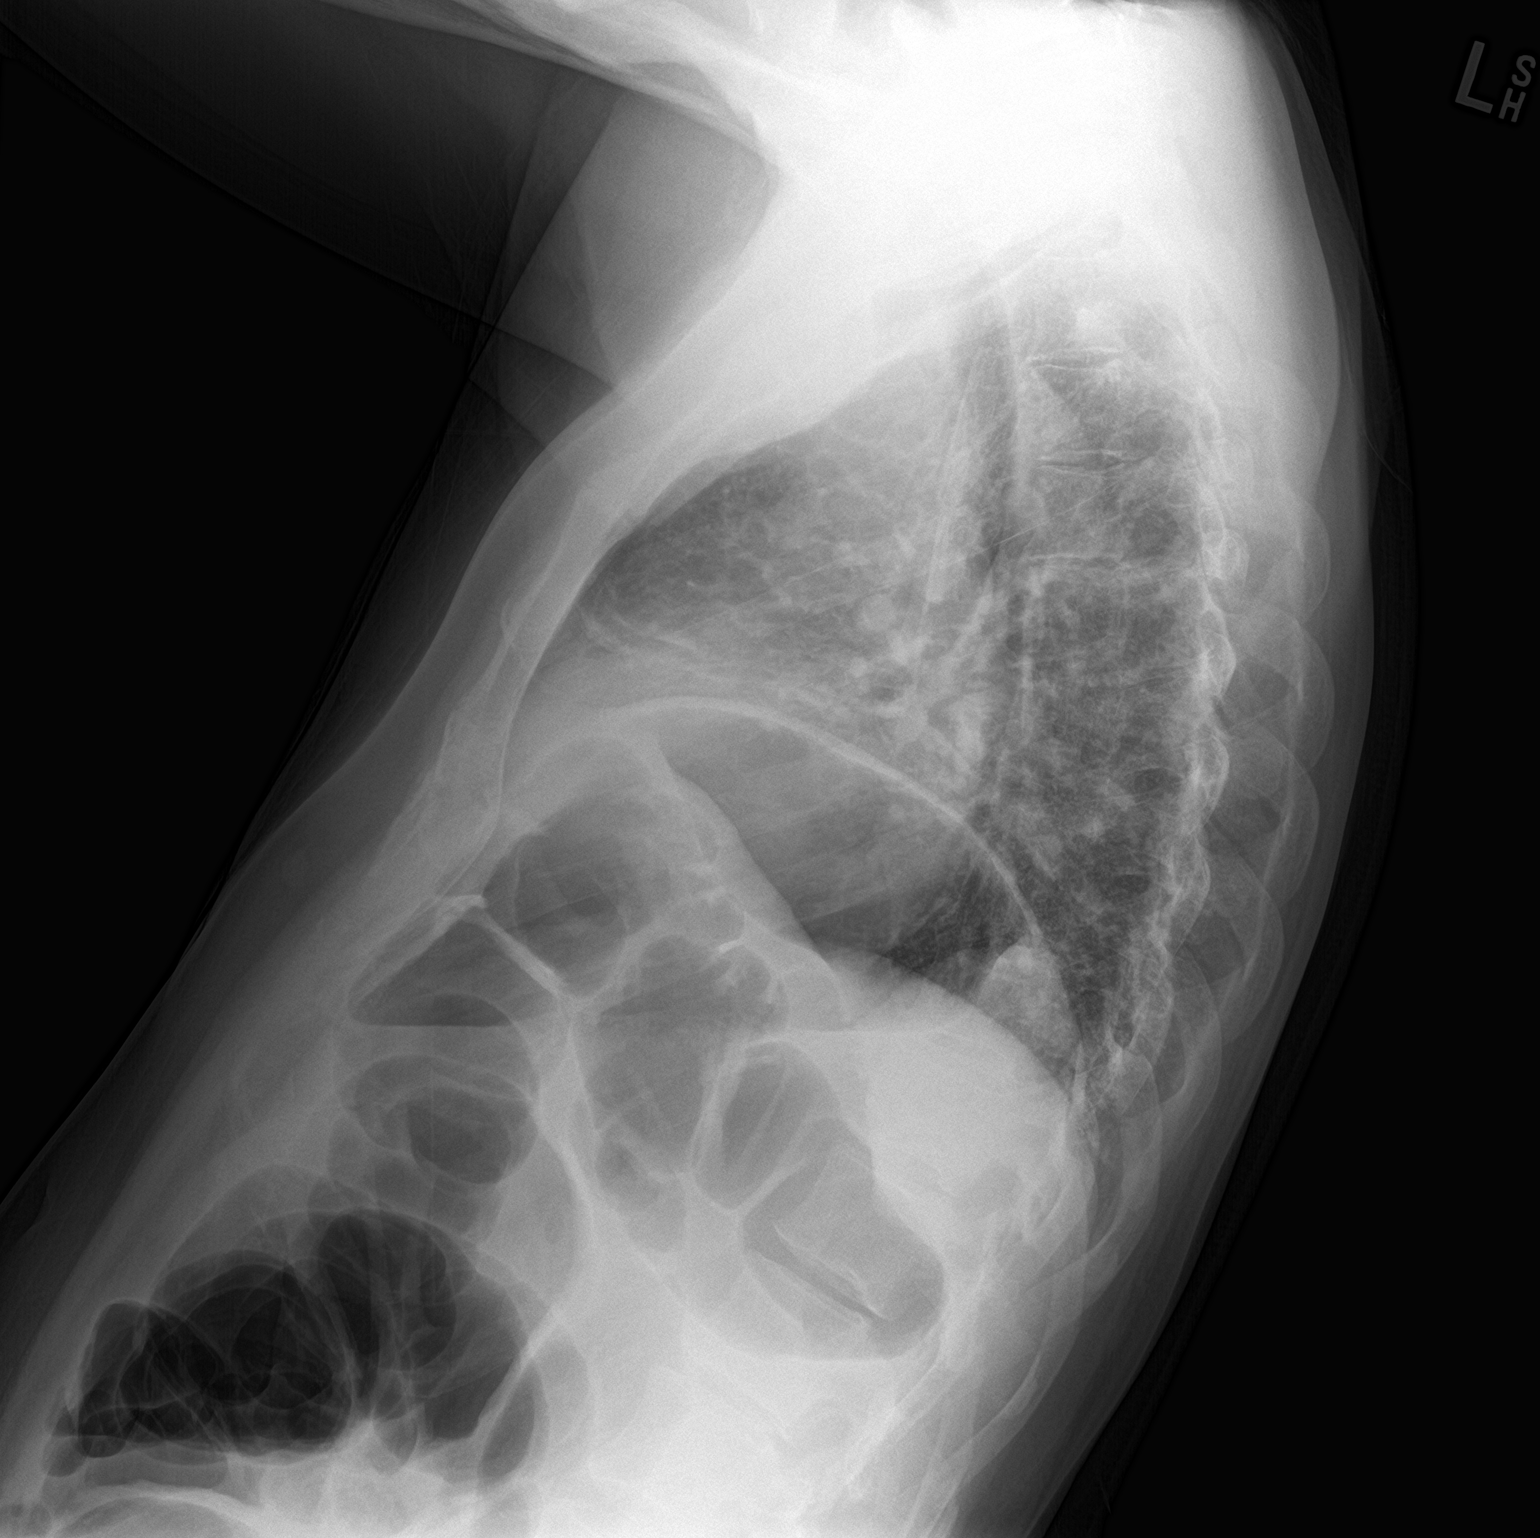

[2 of 2 positions shown; findings below may reference images not displayed]

FINDINGS: Patient is rotated.

Left lower lobe opacity, similar to 7407, favoring chronic
scarring/atelectasis. Associated eventration of left hemidiaphragm.

Lungs are otherwise clear.  No pleural effusion or pneumothorax.

Mild cardiomegaly.

Visualized osseous structures are within normal limits.
IMPRESSION: Chronic left lower lobe opacity, favoring scarring/atelectasis.

## 2020-01-12 ENCOUNTER — Other Ambulatory Visit: Payer: Self-pay

## 2020-01-12 ENCOUNTER — Encounter: Payer: Self-pay | Admitting: Family Medicine

## 2020-01-12 ENCOUNTER — Ambulatory Visit (INDEPENDENT_AMBULATORY_CARE_PROVIDER_SITE_OTHER): Payer: Medicare Other | Admitting: Family Medicine

## 2020-01-12 ENCOUNTER — Ambulatory Visit (INDEPENDENT_AMBULATORY_CARE_PROVIDER_SITE_OTHER): Payer: Medicare Other

## 2020-01-12 VITALS — BP 131/88 | HR 75 | Ht 72.0 in | Wt 187.0 lb

## 2020-01-12 DIAGNOSIS — R4182 Altered mental status, unspecified: Secondary | ICD-10-CM

## 2020-01-12 DIAGNOSIS — R410 Disorientation, unspecified: Secondary | ICD-10-CM | POA: Diagnosis not present

## 2020-01-12 NOTE — Assessment & Plan Note (Addendum)
He appears non-toxic on exam.  No localizing symptoms.   No recent medication changes.  Unable to give urine sample, will bring back tomorrow.  CMP, CBC and CXR ordered.

## 2020-01-12 NOTE — Patient Instructions (Addendum)
Please bring back urine sample Have labs and xray completed today.  If symptoms worsen or he develops new symptoms such as fever, chills or weakness/fatigue please let us know.

## 2020-01-12 NOTE — Progress Notes (Signed)
Richard [name] - 64 y.o. male MRN [id number]  Date of birth: Mar 13, 1956  Subjective Chief Complaint  Patient presents with  . Leg Swelling  . Altered Mental Status    HPI Richard [name] is a 64 y.o. male with history of intellectual disability,seizure disorder and CKD.  He is brought in today by caregiver and is accompanied by brother and sister in law.  They have concern about changes in mental status.  He doesn't seem to be acting like he typically does including changes in fluid intake and is more restless.  He typically naps during the day but hasn't been recently.  He has had normal appetite, bowels are moving normally, he has been afebrile, he has not had any respiratory symptoms.    ROS:  A comprehensive ROS was completed and negative except as noted per HPI  Allergies  Allergen Reactions  . Flucloxacillin Other (See Comments)    Reaction unknown  . Nsaids Other (See Comments)    Only one kidney.    . Phenothiazines Other (See Comments)    Reaction unknown  . Ofloxacin Other (See Comments)    Reaction unknown    Past Medical History:  Diagnosis Date  . Autism spectrum disorder   . Bowel obstruction (HCC)   . Broken leg Left  . CKD (chronic kidney disease) stage 3, GFR 30-59 ml/min (HCC) 11/07/2013   patient has only one kidney  . Detached retina   . Hypercholesteremia   . Hyperlipidemia   . Malnutrition (HCC)   . Mental retardation   . Osteoporosis   . Thyroid disease     Past Surgical History:  Procedure Laterality Date  . ABDOMINAL SURGERY    . APPENDECTOMY    . DENTAL RESTORATION/EXTRACTION WITH X-RAY N/A 06/09/2019   Procedure: DENTAL RESTORATION/EXTRACTION Teeth 16, 17 WITH X-RAY Diagnostic, Dental Cleaning, Dental Fillings on Teeth 7,90,24,09;  Surgeon: Cristino Martes, DMD;  Location: [facility] OR;  Service: Dentistry;  Laterality: N/A;  . EYE SURGERY Left 03-14-2013   catatract  . LEG SURGERY Left     Social History   Socioeconomic History  . Marital  status: Single    Spouse name: Not on file  . Number of children: Not on file  . Years of education: Not on file  . Highest education level: Not on file  Occupational History  . Not on file  Tobacco Use  . Smoking status: Never Smoker  . Smokeless tobacco: Never Used  Vaping Use  . Vaping Use: Never used  Substance and Sexual Activity  . Alcohol use: No  . Drug use: No  . Sexual activity: Never  Other Topics Concern  . Not on file  Social History Narrative   Disabled.  Completed 2nd grade.  Lives in a facility here in [place]. Brother lives nearby.     Social Determinants of Health   Financial Resource Strain:   . Difficulty of Paying Living Expenses: Not on file  Food Insecurity:   . Worried About Programme researcher, broadcasting/film/video in the Last Year: Not on file  . Ran Out of Food in the Last Year: Not on file  Transportation Needs:   . Lack of Transportation (Medical): Not on file  . Lack of Transportation (Non-Medical): Not on file  Physical Activity:   . Days of Exercise per Week: Not on file  . Minutes of Exercise per Session: Not on file  Stress:   . Feeling of Stress : Not on file  Social Connections:   .  Frequency of Communication with Friends and Family: Not on file  . Frequency of Social Gatherings with Friends and Family: Not on file  . Attends Religious Services: Not on file  . Active Member of Clubs or Organizations: Not on file  . Attends Banker Meetings: Not on file  . Marital Status: Not on file    Family History  Problem Relation Age of Onset  . Hypertension Other   . Diabetes Other   . Hyperlipidemia Other   . Scoliosis Other     Health Maintenance  Topic Date Due  . COLONOSCOPY  Never done  . COVID-19 Vaccine (2 - Moderna 2-dose series) 05/14/2019  . INFLUENZA VACCINE  10/30/2019  . TETANUS/TDAP  12/31/2026  . Hepatitis C Screening  Completed  . HIV Screening  Completed      ----------------------------------------------------------------------------------------------------------------------------------------------------------------------------------------------------------------- Physical Exam BP 131/88 (BP Location: Left Arm, Patient Position: Sitting, Cuff Size: Normal)   Pulse 75   Wt 187 lb (84.8 kg)   SpO2 96%   BMI 25.36 kg/m   Physical Exam Constitutional:      Appearance: Normal appearance.  HENT:     Head: Normocephalic and atraumatic.  Eyes:     General: No scleral icterus. Cardiovascular:     Rate and Rhythm: Normal rate and regular rhythm.  Pulmonary:     Effort: Pulmonary effort is normal.     Breath sounds: Normal breath sounds.  Abdominal:     General: There is no distension.     Palpations: Abdomen is soft.     Tenderness: There is no abdominal tenderness. There is no guarding.  Musculoskeletal:     Cervical back: Neck supple.  Skin:    General: Skin is warm and dry.  Neurological:     General: No focal deficit present.     Mental Status: He is alert.  Psychiatric:        Mood and Affect: Mood normal.        Behavior: Behavior normal.     ------------------------------------------------------------------------------------------------------------------------------------------------------------------------------------------------------------------- Assessment and Plan  Altered mental state He appears non-toxic on exam.  No localizing symptoms.   No recent medication changes.  Unable to give urine sample, will bring back tomorrow.  CMP, CBC and CXR ordered.     No orders of the defined types were placed in this encounter.  35 minutes spent including pre visit preparation, review of prior notes and labs, encounter with patient via video visit and same day documentation.  No follow-ups on file.    This visit occurred during the SARS-CoV-2 public health emergency.  Safety protocols were in place, including  screening questions prior to the visit, additional usage of staff PPE, and extensive cleaning of exam room while observing appropriate contact time as indicated for disinfecting solutions.

## 2020-01-13 LAB — POCT URINALYSIS DIP (CLINITEK)
Bilirubin, UA: NEGATIVE
Blood, UA: NEGATIVE
Glucose, UA: NEGATIVE mg/dL
Ketones, POC UA: NEGATIVE mg/dL
Leukocytes, UA: NEGATIVE
Nitrite, UA: NEGATIVE
POC PROTEIN,UA: NEGATIVE
Spec Grav, UA: 1.01 (ref 1.010–1.025)
Urobilinogen, UA: 0.2 E.U./dL
pH, UA: 6 (ref 5.0–8.0)

## 2020-01-13 LAB — CBC WITH DIFFERENTIAL/PLATELET
Absolute Monocytes: 800 cells/uL (ref 200–950)
Basophils Absolute: 18 cells/uL (ref 0–200)
Basophils Relative: 0.2 %
Eosinophils Absolute: 156 cells/uL (ref 15–500)
Eosinophils Relative: 1.7 %
HCT: 45.3 % (ref 38.5–50.0)
Hemoglobin: 15.4 g/dL (ref 13.2–17.1)
Lymphs Abs: 1858 cells/uL (ref 850–3900)
MCH: 32.6 pg (ref 27.0–33.0)
MCHC: 34 g/dL (ref 32.0–36.0)
MCV: 95.8 fL (ref 80.0–100.0)
MPV: 12.9 fL — ABNORMAL HIGH (ref 7.5–12.5)
Monocytes Relative: 8.7 %
Neutro Abs: 6366 cells/uL (ref 1500–7800)
Neutrophils Relative %: 69.2 %
Platelets: 221 10*3/uL (ref 140–400)
RBC: 4.73 10*6/uL (ref 4.20–5.80)
RDW: 12.6 % (ref 11.0–15.0)
Total Lymphocyte: 20.2 %
WBC: 9.2 10*3/uL (ref 3.8–10.8)

## 2020-01-13 LAB — COMPLETE METABOLIC PANEL WITH GFR
AG Ratio: 1.8 (calc) (ref 1.0–2.5)
ALT: 15 U/L (ref 9–46)
AST: 20 U/L (ref 10–35)
Albumin: 4.3 g/dL (ref 3.6–5.1)
Alkaline phosphatase (APISO): 91 U/L (ref 35–144)
BUN/Creatinine Ratio: 14 (calc) (ref 6–22)
BUN: 27 mg/dL — ABNORMAL HIGH (ref 7–25)
CO2: 22 mmol/L (ref 20–32)
Calcium: 9.5 mg/dL (ref 8.6–10.3)
Chloride: 107 mmol/L (ref 98–110)
Creat: 1.89 mg/dL — ABNORMAL HIGH (ref 0.70–1.25)
GFR, Est African American: 43 mL/min/{1.73_m2} — ABNORMAL LOW (ref >=60)
GFR, Est Non African American: 37 mL/min/{1.73_m2} — ABNORMAL LOW (ref >=60)
Globulin: 2.4 g/dL (calc) (ref 1.9–3.7)
Glucose, Bld: 81 mg/dL (ref 65–139)
Potassium: 4.8 mmol/L (ref 3.5–5.3)
Sodium: 140 mmol/L (ref 135–146)
Total Bilirubin: 0.4 mg/dL (ref 0.2–1.2)
Total Protein: 6.7 g/dL (ref 6.1–8.1)

## 2020-01-13 NOTE — Addendum Note (Signed)
Addended by: Ardyth Man on: 01/13/2020 03:16 PM   Modules accepted: Orders

## 2020-01-15 ENCOUNTER — Other Ambulatory Visit: Payer: Self-pay | Admitting: Family Medicine

## 2020-01-15 MED ORDER — FUROSEMIDE 20 MG PO TABS: ORAL_TABLET | ORAL | 0 refills | Status: DC

## 2020-01-25 ENCOUNTER — Ambulatory Visit (INDEPENDENT_AMBULATORY_CARE_PROVIDER_SITE_OTHER): Payer: Medicare Other | Admitting: Family Medicine

## 2020-01-25 ENCOUNTER — Encounter: Payer: Self-pay | Admitting: Family Medicine

## 2020-01-25 VITALS — BP 115/72 | HR 107 | Ht 72.0 in | Wt 185.0 lb

## 2020-01-25 DIAGNOSIS — N401 Enlarged prostate with lower urinary tract symptoms: Secondary | ICD-10-CM | POA: Diagnosis not present

## 2020-01-25 DIAGNOSIS — N400 Enlarged prostate without lower urinary tract symptoms: Secondary | ICD-10-CM | POA: Insufficient documentation

## 2020-01-25 DIAGNOSIS — Z23 Encounter for immunization: Secondary | ICD-10-CM

## 2020-01-25 DIAGNOSIS — I517 Cardiomegaly: Secondary | ICD-10-CM | POA: Diagnosis not present

## 2020-01-25 DIAGNOSIS — K449 Diaphragmatic hernia without obstruction or gangrene: Secondary | ICD-10-CM

## 2020-01-25 DIAGNOSIS — N1832 Chronic kidney disease, stage 3b: Secondary | ICD-10-CM

## 2020-01-25 DIAGNOSIS — N261 Atrophy of kidney (terminal): Secondary | ICD-10-CM

## 2020-01-25 NOTE — Progress Notes (Signed)
Pt was seen by Dr. Ashley Royalty on 10/14 for altered mental status, leg swelling. Pt had labs done and CXR which showed enlargement and increased vascular congestion. He was started on Lasix 40 mg x 3 days and 20 mg until his f/u today.   His brother was asking about how this may have started. And would like to discuss next steps with regards to him getting in with cardiology.   Pt has also had cough/runny nose x 1 wk.

## 2020-01-25 NOTE — Assessment & Plan Note (Addendum)
Unclear etiology he has no diagnosis of hypertension.  In looking back over her old chest x-rays mild cardiomegaly was mentioned going back as far as 2013.  Though I was unable to find anything in the old records that we have here but I do not have his extensive old records from [place].  Will move forward with cardiomegaly for further evaluation to look at the chambers and the valves.  No overload of fluid or volume today on exam.  Will monitor carefully for any new onset symptoms or signs of heart failure.

## 2020-01-25 NOTE — Progress Notes (Signed)
Established Patient Office Visit  Subjective:  Patient ID: Richard [name], male    DOB: 01-30-1956  Age: 64 y.o. MRN: [id number]  CC:  Chief Complaint  Patient presents with  . Follow-up    HPI Richard [name] presents for for follow-up of recent appointment about 2 weeks ago for some mental status changes and increased swelling in his legs.  He had no prior history of that before.  Lab work was fairly unrevealing but he was given Lasix for couple days to reduce the fluid.  He is doing well overall since then.  Though this week he has had a little bit of nasal congestion and cough but no fever or chills.  Several men at the group home have similar symptoms.  His brother is also here with him today and is concerned because his recent chest x-ray showed some vascular  congestion as well as some cardiomegaly.  In going back to look at all chest x-rays it was noted all the way back to 2013 a mildly enlarged cardiac silhouette on chest x-ray.  Past Medical History:  Diagnosis Date  . Autism spectrum disorder   . Bowel obstruction (HCC)   . Broken leg Left  . CKD (chronic kidney disease) stage 3, GFR 30-59 ml/min (HCC) 11/07/2013   patient has only one kidney  . Detached retina   . Hypercholesteremia   . Hyperlipidemia   . Malnutrition (HCC)   . Mental retardation   . Osteoporosis   . Thyroid disease     Past Surgical History:  Procedure Laterality Date  . ABDOMINAL SURGERY    . APPENDECTOMY    . DENTAL RESTORATION/EXTRACTION WITH X-RAY N/A 06/09/2019   Procedure: DENTAL RESTORATION/EXTRACTION Teeth 16, 17 WITH X-RAY Diagnostic, Dental Cleaning, Dental Fillings on Teeth 5,63,87,56;  Surgeon: Cristino Martes, DMD;  Location: [facility] OR;  Service: Dentistry;  Laterality: N/A;  . EYE SURGERY Left 03-14-2013   catatract  . LEG SURGERY Left     Family History  Problem Relation Age of Onset  . Hypertension Other   . Diabetes Other   . Hyperlipidemia Other   . Scoliosis Other      Social History   Socioeconomic History  . Marital status: Single    Spouse name: Not on file  . Number of children: Not on file  . Years of education: Not on file  . Highest education level: Not on file  Occupational History  . Not on file  Tobacco Use  . Smoking status: Never Smoker  . Smokeless tobacco: Never Used  Vaping Use  . Vaping Use: Never used  Substance and Sexual Activity  . Alcohol use: No  . Drug use: No  . Sexual activity: Never  Other Topics Concern  . Not on file  Social History Narrative   Disabled.  Completed 2nd grade.  Lives in a facility here in [place]. Brother lives nearby.     Social Determinants of Health   Financial Resource Strain:   . Difficulty of Paying Living Expenses: Not on file  Food Insecurity:   . Worried About Programme researcher, broadcasting/film/video in the Last Year: Not on file  . Ran Out of Food in the Last Year: Not on file  Transportation Needs:   . Lack of Transportation (Medical): Not on file  . Lack of Transportation (Non-Medical): Not on file  Physical Activity:   . Days of Exercise per Week: Not on file  . Minutes of Exercise per Session: Not on file  Stress:   . Feeling of Stress : Not on file  Social Connections:   . Frequency of Communication with Friends and Family: Not on file  . Frequency of Social Gatherings with Friends and Family: Not on file  . Attends Religious Services: Not on file  . Active Member of Clubs or Organizations: Not on file  . Attends Banker Meetings: Not on file  . Marital Status: Not on file  Intimate Partner Violence:   . Fear of Current or Ex-Partner: Not on file  . Emotionally Abused: Not on file  . Physically Abused: Not on file  . Sexually Abused: Not on file    Outpatient Medications Prior to Visit  Medication Sig Dispense Refill  . Calcium Carbonate-Vitamin D 600-400 MG-UNIT tablet Take 1 tablet by mouth daily. 90 tablet 3  . docusate sodium (COLACE) 100 MG capsule Take 1 capsule (100  mg total) by mouth 2 (two) times daily. 180 capsule 3  . doxepin (SINEQUAN) 10 MG capsule Take 10 mg by mouth at bedtime. (2200)    . furosemide (LASIX) 20 MG tablet Take 40 mg x3 days then reduce to 20mg  daily until follow up 20 tablet 0  . Multiple Vitamin (MULTIVITAMIN WITH MINERALS) TABS tablet Take 1 tablet by mouth daily. (0800)    . polyethylene glycol powder (GLYCOLAX/MIRALAX) powder Take 17 g by mouth daily. 850 g PRN  . simvastatin (ZOCOR) 40 MG tablet TAKE 1 TABLET BY MOUTH AT BEDTIME 30 tablet 11  . tamsulosin (FLOMAX) 0.4 MG CAPS capsule TAKE ONE CAPSULE BY MOUTH ONCE DAILY AFTER SUPPER 30 capsule 10  . Vitamin D3 (VITAMIN D) 25 MCG tablet Take 1,000 Units by mouth daily.     No facility-administered medications prior to visit.    Allergies  Allergen Reactions  . Flucloxacillin Other (See Comments)    Reaction unknown  . Nsaids Other (See Comments)    Only one kidney.    . Phenothiazines Other (See Comments)    Reaction unknown  . Ofloxacin Other (See Comments)    Reaction unknown    ROS Review of Systems    Objective:    Physical Exam Constitutional:      Appearance: He is well-developed.  HENT:     Head: Normocephalic and atraumatic.  Cardiovascular:     Rate and Rhythm: Normal rate and regular rhythm.     Heart sounds: Normal heart sounds.  Pulmonary:     Effort: Pulmonary effort is normal.     Breath sounds: Normal breath sounds.  Musculoskeletal:     Comments: No lower extremity edema.  Dorsal pedal pulse 1+ in the left foot.  Good capillary refill.  No rash.  Skin:    General: Skin is warm and dry.  Neurological:     Mental Status: He is alert and oriented to person, place, and time.  Psychiatric:        Behavior: Behavior normal.     BP 115/72   Pulse (!) 107   Ht 6' (1.829 m)   Wt 185 lb (83.9 kg)   SpO2 98%   BMI 25.09 kg/m  Wt Readings from Last 3 Encounters:  01/25/20 185 lb (83.9 kg)  01/12/20 187 lb (84.8 kg)  06/09/19 184 lb  (83.5 kg)     There are no preventive care reminders to display for this patient.  There are no preventive care reminders to display for this patient.  Lab Results  Component Value Date   TSH 3.06  06/30/2017   Lab Results  Component Value Date   WBC 9.2 01/12/2020   HGB 15.4 01/12/2020   HCT 45.3 01/12/2020   MCV 95.8 01/12/2020   PLT 221 01/12/2020   Lab Results  Component Value Date   NA 140 01/12/2020   K 4.8 01/12/2020   CO2 22 01/12/2020   GLUCOSE 81 01/12/2020   BUN 27 (H) 01/12/2020   CREATININE 1.89 (H) 01/12/2020   BILITOT 0.4 01/12/2020   ALKPHOS 131 (H) 09/21/2017   AST 20 01/12/2020   ALT 15 01/12/2020   PROT 6.7 01/12/2020   ALBUMIN 4.1 09/21/2017   CALCIUM 9.5 01/12/2020   ANIONGAP 8 06/09/2019   Lab Results  Component Value Date   CHOL 167 04/21/2019   Lab Results  Component Value Date   HDL 42 04/21/2019   Lab Results  Component Value Date   LDLCALC 94 04/21/2019   Lab Results  Component Value Date   TRIG 221 (H) 04/21/2019   Lab Results  Component Value Date   CHOLHDL 4.0 04/21/2019   No results found for: HGBA1C    Assessment & Plan:   Problem List Items Addressed This Visit      Cardiovascular and Mediastinum   Cardiomegaly    Unclear etiology he has no diagnosis of hypertension.  In looking back over her old chest x-rays mild cardiomegaly was mentioned going back as far as 2013.  Though I was unable to find anything in the old records that we have here but I do not have his extensive old records from [place].  Will move forward with cardiomegaly for further evaluation to look at the chambers and the valves.  No overload of fluid or volume today on exam.  Will monitor carefully for any new onset symptoms or signs of heart failure.      Relevant Orders   ECHOCARDIOGRAM COMPLETE     Genitourinary   Chronic kidney disease (CKD) stage G3b/A1, moderately decreased glomerular filtration rate (GFR) between 30-44 mL/min/1.73 square  meter and albuminuria creatinine ratio less than 30 mg/g (HCC)    Followed by nephrology yearly.  Renal function much improved since coming off of ACE inhibitor and diuretic.  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      BPH (benign prostatic hyperplasia)   Atrophy of right kidney     Other   Hiatal hernia    Other Visit Diagnoses    Need for immunization against influenza    -  Primary   Relevant Orders   Flu Vaccine QUAD 36+ mos IM (Completed)      No orders of the defined types were placed in this encounter.   Follow-up: No follow-ups on file.   I spent 32 minutes on the day of the encounter to include pre-visit record review, face-to-face time with the patient and post visit ordering of test.   [name], MD

## 2020-01-25 NOTE — Assessment & Plan Note (Signed)
Followed by nephrology yearly.  Renal function much improved since coming off of ACE inhibitor and diuretic.  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

## 2020-02-01 ENCOUNTER — Telehealth: Payer: Self-pay | Admitting: Family Medicine

## 2020-02-01 MED ORDER — AZITHROMYCIN 250 MG PO TABS: ORAL_TABLET | ORAL | 0 refills | Status: AC

## 2020-02-01 NOTE — Telephone Encounter (Signed)
Sent over antibiotic, if not improving then please let me know.

## 2020-02-01 NOTE — Telephone Encounter (Signed)
Dr. Linford Arnold   I called to give Richard [name] manager information on when Echo is scheduled for Mr. Gulbranson and while on the phone she mentioned that since last Wednesday when he was last seen for his cough he is now running a low grade fever and not eating. He had a temp of 99.4 they have been giving him Tylenol and Robitussin but he is very snotty and sneezing. Please advise. - CF

## 2020-02-02 ENCOUNTER — Telehealth: Payer: Medicare Other | Admitting: Family Medicine

## 2020-02-02 NOTE — Telephone Encounter (Signed)
Spoke w/Navia and advised her of Dr. Shelah Lewandowsky recommendations of starting the ABX and if Richard [name] is not feeling any better by Monday to call that morning and she will do a visit with him at that time.   She was agreeable to this .

## 2020-02-07 ENCOUNTER — Ambulatory Visit (INDEPENDENT_AMBULATORY_CARE_PROVIDER_SITE_OTHER): Payer: Medicare Other | Admitting: Family Medicine

## 2020-02-07 ENCOUNTER — Ambulatory Visit (INDEPENDENT_AMBULATORY_CARE_PROVIDER_SITE_OTHER): Payer: Medicare Other

## 2020-02-07 ENCOUNTER — Encounter: Payer: Self-pay | Admitting: Family Medicine

## 2020-02-07 ENCOUNTER — Other Ambulatory Visit: Payer: Self-pay

## 2020-02-07 VITALS — BP 141/89 | HR 92 | Temp 98.2°F | Ht 72.0 in | Wt 180.0 lb

## 2020-02-07 DIAGNOSIS — E7800 Pure hypercholesterolemia, unspecified: Secondary | ICD-10-CM | POA: Diagnosis not present

## 2020-02-07 DIAGNOSIS — N401 Enlarged prostate with lower urinary tract symptoms: Secondary | ICD-10-CM | POA: Diagnosis not present

## 2020-02-07 DIAGNOSIS — F5101 Primary insomnia: Secondary | ICD-10-CM

## 2020-02-07 DIAGNOSIS — R059 Cough, unspecified: Secondary | ICD-10-CM

## 2020-02-07 DIAGNOSIS — I517 Cardiomegaly: Secondary | ICD-10-CM | POA: Diagnosis not present

## 2020-02-07 DIAGNOSIS — K5904 Chronic idiopathic constipation: Secondary | ICD-10-CM | POA: Diagnosis not present

## 2020-02-07 MED ORDER — VITAMIN D3 25 MCG PO TABS: 1000.0000 [IU] | ORAL_TABLET | Freq: Every day | ORAL | 11 refills | Status: DC

## 2020-02-07 MED ORDER — FUROSEMIDE 20 MG PO TABS: 20.0000 mg | ORAL_TABLET | ORAL | 2 refills | Status: DC

## 2020-02-07 MED ORDER — SIMVASTATIN 40 MG PO TABS: 40.0000 mg | ORAL_TABLET | Freq: Every day | ORAL | 11 refills | Status: DC

## 2020-02-07 MED ORDER — DOCUSATE SODIUM 100 MG PO CAPS: 100.0000 mg | ORAL_CAPSULE | Freq: Two times a day (BID) | ORAL | 11 refills | Status: AC

## 2020-02-07 MED ORDER — DOXEPIN HCL 10 MG PO CAPS: 10.0000 mg | ORAL_CAPSULE | Freq: Every day | ORAL | 5 refills | Status: DC

## 2020-02-07 MED ORDER — ADULT MULTIVITAMIN W/MINERALS CH: 1.0000 | ORAL_TABLET | Freq: Every day | ORAL | 11 refills | Status: AC

## 2020-02-07 MED ORDER — CALCIUM CARBONATE-VITAMIN D 600-400 MG-UNIT PO TABS: 1.0000 | ORAL_TABLET | Freq: Every day | ORAL | 11 refills | Status: AC

## 2020-02-07 MED ORDER — TAMSULOSIN HCL 0.4 MG PO CAPS: ORAL_CAPSULE | ORAL | 11 refills | Status: DC

## 2020-02-07 MED ORDER — POLYETHYLENE GLYCOL 3350 17 GM/SCOOP PO POWD: 17.0000 g | Freq: Every day | ORAL | 99 refills | Status: AC | PRN

## 2020-02-07 NOTE — Progress Notes (Addendum)
Established Patient Office Visit  Subjective:  Patient ID: Richard [name], male    DOB: May 25, 1955  Age: 64 y.o. MRN: [id number]  CC:  Chief Complaint  Patient presents with  . Cough  . Nasal Congestion    HPI Richard PaganiniCharles [name] presents for cough and runny nose.  Saw him for routine follow-up on October 27 at that time his brother had noted a little bit of nasal congestion and a little bit of cough but no fever or chills.  Live in a group home and several other members had had similar upper respiratory symptoms.  Exam was clear at that time.  But the group home actually called back and said he really was not getting any better.  So on November 3, sent in a prescription for azithromycin.  He did complete the 5-day course of antibiotics.  Really unfortunately is not any better.  He ran a low-grade temp the other day around 99.  The cough seems to be getting more frequent every couple minutes he will cough.  It sounds productive.  They have not noticed any lower extremity swelling.  Appetite has been okay really only had 1 night where he did not want to eat dinner he just wanted to go to bed because he felt tired.  Past Medical History:  Diagnosis Date  . Autism spectrum disorder   . Bowel obstruction (HCC)   . Broken leg Left  . CKD (chronic kidney disease) stage 3, GFR 30-59 ml/min (HCC) 11/07/2013   patient has only one kidney  . Detached retina   . Hypercholesteremia   . Hyperlipidemia   . Malnutrition (HCC)   . Mental retardation   . Osteoporosis   . Thyroid disease     Past Surgical History:  Procedure Laterality Date  . ABDOMINAL SURGERY    . APPENDECTOMY    . DENTAL RESTORATION/EXTRACTION WITH X-RAY N/A 06/09/2019   Procedure: DENTAL RESTORATION/EXTRACTION Teeth 16, 17 WITH X-RAY Diagnostic, Dental Cleaning, Dental Fillings on Teeth 7,82,95,623,10,30,31;  Surgeon: Cristino MartesWilson, Tommy J, DMD;  Location: [facility] OR;  Service: Dentistry;  Laterality: N/A;  . EYE SURGERY Left 03-14-2013   catatract   . LEG SURGERY Left     Family History  Problem Relation Age of Onset  . Hypertension Other   . Diabetes Other   . Hyperlipidemia Other   . Scoliosis Other     Social History   Socioeconomic History  . Marital status: Single    Spouse name: Not on file  . Number of children: Not on file  . Years of education: Not on file  . Highest education level: Not on file  Occupational History  . Not on file  Tobacco Use  . Smoking status: Never Smoker  . Smokeless tobacco: Never Used  Vaping Use  . Vaping Use: Never used  Substance and Sexual Activity  . Alcohol use: No  . Drug use: No  . Sexual activity: Never  Other Topics Concern  . Not on file  Social History Narrative   Disabled.  Completed 2nd grade.  [place]ives in [place] facility here in [place]. Brother lives nearby.     Social Determinants of Health   Financial Resource Strain:   . Difficulty of Paying Living Expenses: Not on file  Food Insecurity:   . Worried About Programme researcher, broadcasting/film/videounning Out of Food in the Last Year: Not on file  . Ran Out of Food in the Last Year: Not on file  Transportation Needs:   . Lack of Transportation (  Medical): Not on file  . Lack of Transportation (Non-Medical): Not on file  Physical Activity:   . Days of Exercise per Week: Not on file  . Minutes of Exercise per Session: Not on file  Stress:   . Feeling of Stress : Not on file  Social Connections:   . Frequency of Communication with Friends and Family: Not on file  . Frequency of Social Gatherings with Friends and Family: Not on file  . Attends Religious Services: Not on file  . Active Member of Clubs or Organizations: Not on file  . Attends Banker Meetings: Not on file  . Marital Status: Not on file  Intimate Partner Violence:   . Fear of Current or Ex-Partner: Not on file  . Emotionally Abused: Not on file  . Physically Abused: Not on file  . Sexually Abused: Not on file    Outpatient Medications Prior to Visit  Medication Sig Dispense  Refill  . Calcium Carbonate-Vitamin D 600-400 MG-UNIT tablet Take 1 tablet by mouth daily. 90 tablet 3  . docusate sodium (COLACE) 100 MG capsule Take 1 capsule (100 mg total) by mouth 2 (two) times daily. 180 capsule 3  . doxepin (SINEQUAN) 10 MG capsule Take 10 mg by mouth at bedtime. (2200)    . furosemide (LASIX) 20 MG tablet Take 40 mg x3 days then reduce to 20mg  daily until follow up 20 tablet 0  . Multiple Vitamin (MULTIVITAMIN WITH MINERALS) TABS tablet Take 1 tablet by mouth daily. (0800)    . polyethylene glycol powder (GLYCOLAX/MIRALAX) powder Take 17 g by mouth daily. 850 g PRN  . simvastatin (ZOCOR) 40 MG tablet TAKE 1 TABLET BY MOUTH AT BEDTIME 30 tablet 11  . tamsulosin (FLOMAX) 0.4 MG CAPS capsule TAKE ONE CAPSULE BY MOUTH ONCE DAILY AFTER SUPPER 30 capsule 10  . Vitamin D3 (VITAMIN D) 25 MCG tablet Take 1,000 Units by mouth daily.     No facility-administered medications prior to visit.    Allergies  Allergen Reactions  . Flucloxacillin Other (See Comments)    Reaction unknown  . Nsaids Other (See Comments)    Only one kidney.    . Phenothiazines Other (See Comments)    Reaction unknown  . Ofloxacin Other (See Comments)    Reaction unknown    ROS Review of Systems    Objective:    Physical Exam Constitutional:      Appearance: He is well-developed.  HENT:     Head: Normocephalic and atraumatic.     Right Ear: External ear normal.     Left Ear: External ear normal.     Ears:     Comments: Both TMs blocked by cerumen    Nose: Nose normal.     Mouth/Throat:     Mouth: Mucous membranes are moist.     Pharynx: Oropharynx is clear. No posterior oropharyngeal erythema.  Eyes:     Conjunctiva/sclera: Conjunctivae normal.     Pupils: Pupils are equal, round, and reactive to light.  Neck:     Thyroid: No thyromegaly.  Cardiovascular:     Rate and Rhythm: Normal rate.     Heart sounds: Normal heart sounds.  Pulmonary:     Effort: Pulmonary effort is  normal.     Breath sounds: Normal breath sounds.  Musculoskeletal:     Cervical back: Neck supple.  Lymphadenopathy:     Cervical: No cervical adenopathy.  Skin:    General: Skin is warm and dry.  Neurological:  Mental Status: He is alert and oriented to person, place, and time.  Psychiatric:        Mood and Affect: Mood normal.     BP (!) 141/89   Pulse 92   Temp 98.2 F (36.8 C)   Ht 6' (1.829 m)   Wt 180 lb (81.6 kg)   SpO2 98%   BMI 24.41 kg/m  Wt Readings from Last 3 Encounters:  02/07/20 180 lb (81.6 kg)  01/25/20 185 lb (83.9 kg)  01/12/20 187 lb (84.8 kg)     There are no preventive care reminders to display for this patient.  There are no preventive care reminders to display for this patient.  Lab Results  Component Value Date   TSH 3.06 06/30/2017   Lab Results  Component Value Date   WBC 9.2 01/12/2020   HGB 15.4 01/12/2020   HCT 45.3 01/12/2020   MCV 95.8 01/12/2020   PLT 221 01/12/2020   Lab Results  Component Value Date   NA 140 01/12/2020   K 4.8 01/12/2020   CO2 22 01/12/2020   GLUCOSE 81 01/12/2020   BUN 27 (H) 01/12/2020   CREATININE 1.89 (H) 01/12/2020   BILITOT 0.4 01/12/2020   ALKPHOS 131 (H) 09/21/2017   AST 20 01/12/2020   ALT 15 01/12/2020   PROT 6.7 01/12/2020   ALBUMIN 4.1 09/21/2017   CALCIUM 9.5 01/12/2020   ANIONGAP 8 06/09/2019   Lab Results  Component Value Date   CHOL 167 04/21/2019   Lab Results  Component Value Date   HDL 42 04/21/2019   Lab Results  Component Value Date   LDLCALC 94 04/21/2019   Lab Results  Component Value Date   TRIG 221 (H) 04/21/2019   Lab Results  Component Value Date   CHOLHDL 4.0 04/21/2019   No results found for: HGBA1C    Assessment & Plan:   Problem List Items Addressed This Visit      Genitourinary   BPH (benign prostatic hyperplasia)   Relevant Medications   tamsulosin (FLOMAX) 0.4 MG CAPS capsule     Other   Primary insomnia   Relevant Medications    doxepin (SINEQUAN) 10 MG capsule   Hyperlipidemia   Relevant Medications   furosemide (LASIX) 20 MG tablet   simvastatin (ZOCOR) 40 MG tablet   Constipation   Relevant Medications   polyethylene glycol powder (GLYCOLAX/MIRALAX) 17 GM/SCOOP powder   docusate sodium (COLACE) 100 MG capsule    Other Visit Diagnoses    Cough    -  Primary   Relevant Orders   DG Chest 2 View      Cough -clear etiology.  When I first saw him his exam was pretty normal cephalic it was likely viral but he did not improve so we treated him with a Z-Pak he just finished up his antibiotic yesterday and is really not any better if anything he is getting a little bit worse.  Recommend chest x-ray today for further evaluation it does make me wonder if he could have some increased vascular congestion and if some of this could even be volume overload versus a viral or bacterial etiology.  Depending on chest x-ray results could consider to do additional labs if needed.  I suspect we will likely end up treating with a second antibiotic.  Hopefully no sign of pneumonia though lung exam was clear today.  So consider he could have a secondary sinusitis that is causing some postnasal drip.  He is really  not able to describe his symptoms.  He also reports that he needs refills on all of his medications wants to know what to do about the furosemide he has been taking it daily.  We will try to decrease to every other day and see if he tolerates that well.  BUN and creatinine were slightly elevated on recent labs.  Meds ordered this encounter  Medications  . furosemide (LASIX) 20 MG tablet    Sig: Take 1 tablet (20 mg total) by mouth every other day.    Dispense:  15 tablet    Refill:  2  . Vitamin D3 (VITAMIN D) 25 MCG tablet    Sig: Take 1 tablet (1,000 Units total) by mouth daily.    Dispense:  30 tablet    Refill:  11  . tamsulosin (FLOMAX) 0.4 MG CAPS capsule    Sig: TAKE ONE CAPSULE BY MOUTH ONCE DAILY AFTER SUPPER     Dispense:  30 capsule    Refill:  11  . simvastatin (ZOCOR) 40 MG tablet    Sig: Take 1 tablet (40 mg total) by mouth at bedtime.    Dispense:  30 tablet    Refill:  11  . polyethylene glycol powder (GLYCOLAX/MIRALAX) 17 GM/SCOOP powder    Sig: Take 17 g by mouth daily as needed for moderate constipation or severe constipation. For constipation    Dispense:  850 g    Refill:  PRN  . Multiple Vitamin (MULTIVITAMIN WITH MINERALS) TABS tablet    Sig: Take 1 tablet by mouth daily. (0800)    Dispense:  30 tablet    Refill:  11  . doxepin (SINEQUAN) 10 MG capsule    Sig: Take 1 capsule (10 mg total) by mouth at bedtime. (2200)    Dispense:  30 capsule    Refill:  5  . docusate sodium (COLACE) 100 MG capsule    Sig: Take 1 capsule (100 mg total) by mouth 2 (two) times daily.    Dispense:  60 capsule    Refill:  11  . Calcium Carbonate-Vitamin D 600-400 MG-UNIT tablet    Sig: Take 1 tablet by mouth daily.    Dispense:  30 tablet    Refill:  11    Follow-up: No follow-ups on file.    [name], MD

## 2020-02-07 NOTE — Addendum Note (Signed)
Addended by: [name] D on: 02/07/2020 05:48 PM   Modules accepted: Orders, Level of Service

## 2020-02-08 ENCOUNTER — Telehealth: Payer: Self-pay

## 2020-02-08 MED ORDER — CEFDINIR 300 MG PO CAPS: 300.0000 mg | ORAL_CAPSULE | Freq: Two times a day (BID) | ORAL | 0 refills | Status: DC

## 2020-02-08 NOTE — Telephone Encounter (Signed)
I just got his results and put the note on it.  I do not know if his brother has access to Charlie's chart or not so you may need to call him with those results.  Also let him know that I did go ahead and send in another antibiotic to her pharmacy so we may need to also called the group home where [place] lives and let them know to expect that a new antibiotic will be delivered so that he can get started on that as soon as possible.  Also I want him to continue with the Lasix tab once daily through the weekend and then decrease down to every other day.

## 2020-02-08 NOTE — Addendum Note (Signed)
Addended by: [name] D on: 02/08/2020 02:36 PM   Modules accepted: Orders

## 2020-02-08 NOTE — Telephone Encounter (Signed)
Received call from pharmacy, patient has cross sensitivity with Cefdinir and need OK from provider before they can fill for patient.   Please send response to pool as I will not be here Thursday or Friday

## 2020-02-08 NOTE — Telephone Encounter (Signed)
Brother and guardian advised of results and recommendations.

## 2020-02-08 NOTE — Telephone Encounter (Signed)
[name]'s brother Richard [name] would like a call with the x-ray results. I did call the imaging department and they will reach out to radiology for the read.   [phone]

## 2020-02-09 ENCOUNTER — Telehealth: Payer: Self-pay

## 2020-02-09 MED ORDER — AMOXICILLIN-POT CLAVULANATE 875-125 MG PO TABS: 1.0000 | ORAL_TABLET | Freq: Two times a day (BID) | ORAL | 0 refills | Status: DC

## 2020-02-09 NOTE — Telephone Encounter (Signed)
Shanda Bumps with the pharmacy called and states the cefdinir has a cross sensitivity to flucloxacillin. She wanted to know it I is ok for him to take.

## 2020-02-09 NOTE — Telephone Encounter (Signed)
Not sure of his exact reaction I really do not have that well documented in our notes.  So we will just go ahead and change to Augmentin.  New prescription sent and updated.

## 2020-02-09 NOTE — Telephone Encounter (Signed)
Pharmacy advised  

## 2020-02-20 ENCOUNTER — Ambulatory Visit (HOSPITAL_BASED_OUTPATIENT_CLINIC_OR_DEPARTMENT_OTHER)
Admission: RE | Admit: 2020-02-20 | Discharge: 2020-02-20 | Disposition: A | Discharge status: Home / Self Care | Payer: Medicare Other | Source: Ambulatory Visit | Attending: Family Medicine | Admitting: Family Medicine

## 2020-02-20 ENCOUNTER — Other Ambulatory Visit: Payer: Self-pay | Admitting: Family Medicine

## 2020-02-20 DIAGNOSIS — I517 Cardiomegaly: Secondary | ICD-10-CM

## 2020-02-20 LAB — ECHOCARDIOGRAM LIMITED: S' Lateral: 2.77 cm

## 2020-02-20 NOTE — Progress Notes (Signed)
  Echocardiogram 2D Echocardiogram has been performed.  Richard [name] 02/20/2020, 11:22 AM

## 2020-05-08 ENCOUNTER — Encounter: Payer: Medicare Other | Admitting: Family Medicine

## 2020-05-21 ENCOUNTER — Ambulatory Visit (INDEPENDENT_AMBULATORY_CARE_PROVIDER_SITE_OTHER): Payer: Medicare Other | Admitting: Family Medicine

## 2020-05-21 ENCOUNTER — Encounter: Payer: Self-pay | Admitting: Family Medicine

## 2020-05-21 ENCOUNTER — Other Ambulatory Visit: Payer: Self-pay

## 2020-05-21 VITALS — BP 135/82 | HR 82 | Temp 97.6°F | Resp 14 | Ht 72.0 in | Wt 182.0 lb

## 2020-05-21 DIAGNOSIS — Z125 Encounter for screening for malignant neoplasm of prostate: Secondary | ICD-10-CM

## 2020-05-21 DIAGNOSIS — H6122 Impacted cerumen, left ear: Secondary | ICD-10-CM | POA: Diagnosis not present

## 2020-05-21 DIAGNOSIS — Z0000 Encounter for general adult medical examination without abnormal findings: Secondary | ICD-10-CM

## 2020-05-21 DIAGNOSIS — N401 Enlarged prostate with lower urinary tract symptoms: Secondary | ICD-10-CM

## 2020-05-21 DIAGNOSIS — Z1211 Encounter for screening for malignant neoplasm of colon: Secondary | ICD-10-CM | POA: Diagnosis not present

## 2020-05-21 DIAGNOSIS — N1832 Chronic kidney disease, stage 3b: Secondary | ICD-10-CM

## 2020-05-21 NOTE — Patient Instructions (Signed)

## 2020-05-21 NOTE — Progress Notes (Addendum)
Subjective:   Richard [name] is a 65 y.o. male who presents for Medicare Annual/Subsequent preventive examination.  Review of Systems      Established Patient Office Visit  Subjective:  Patient ID: Richard [name], male    DOB: Jun 19, 1955  Age: 65 y.o. MRN: [id number]  CC: No chief complaint on file.   HPI Majour Frei presents for [facility]   Past Medical History:  Diagnosis Date  . Autism spectrum disorder   . Bowel obstruction (HCC)   . Broken leg Left  . CKD (chronic kidney disease) stage 3, GFR 30-59 ml/min (HCC) 11/07/2013   patient has only one kidney  . Detached retina   . Hypercholesteremia   . Hyperlipidemia   . Malnutrition (HCC)   . Mental retardation   . Osteoporosis   . Thyroid disease     Past Surgical History:  Procedure Laterality Date  . ABDOMINAL SURGERY    . APPENDECTOMY    . DENTAL RESTORATION/EXTRACTION WITH X-RAY N/A 06/09/2019   Procedure: DENTAL RESTORATION/EXTRACTION Teeth 16, 17 WITH X-RAY Diagnostic, Dental Cleaning, Dental Fillings on Teeth 8,34,19,62;  Surgeon: Cristino Martes, DMD;  Location: [facility] OR;  Service: Dentistry;  Laterality: N/A;  . EYE SURGERY Left 03-14-2013   catatract  . LEG SURGERY Left     Family History  Problem Relation Age of Onset  . Hypertension Other   . Diabetes Other   . Hyperlipidemia Other   . Scoliosis Other     Social History   Socioeconomic History  . Marital status: Single    Spouse name: Not on file  . Number of children: Not on file  . Years of education: Not on file  . Highest education level: Not on file  Occupational History  . Not on file  Tobacco Use  . Smoking status: Never Smoker  . Smokeless tobacco: Never Used  Vaping Use  . Vaping Use: Never used  Substance and Sexual Activity  . Alcohol use: No  . Drug use: No  . Sexual activity: Never  Other Topics Concern  . Not on file  Social History Narrative   Disabled.  Completed 2nd grade.  Lives in a facility here in  [place]. Brother lives nearby.     Social Determinants of Health   Financial Resource Strain: Not on file  Food Insecurity: Not on file  Transportation Needs: Not on file  Physical Activity: Not on file  Stress: Not on file  Social Connections: Not on file  Intimate Partner Violence: Not on file    Outpatient Medications Prior to Visit  Medication Sig Dispense Refill  . Calcium Carbonate-Vitamin D 600-400 MG-UNIT tablet Take 1 tablet by mouth daily. 30 tablet 11  . docusate sodium (COLACE) 100 MG capsule Take 1 capsule (100 mg total) by mouth 2 (two) times daily. 60 capsule 11  . doxepin (SINEQUAN) 10 MG capsule Take 1 capsule (10 mg total) by mouth at bedtime. (2200) 30 capsule 5  . furosemide (LASIX) 20 MG tablet Take 1 tablet (20 mg total) by mouth every other day. 15 tablet 2  . Multiple Vitamin (MULTIVITAMIN WITH MINERALS) TABS tablet Take 1 tablet by mouth daily. (0800) 30 tablet 11  . polyethylene glycol powder (GLYCOLAX/MIRALAX) 17 GM/SCOOP powder Take 17 g by mouth daily as needed for moderate constipation or severe constipation. For constipation 850 g PRN  . simvastatin (ZOCOR) 40 MG tablet Take 1 tablet (40 mg total) by mouth at bedtime. 30 tablet 11  . tamsulosin (  FLOMAX) 0.4 MG CAPS capsule TAKE ONE CAPSULE BY MOUTH ONCE DAILY AFTER SUPPER 30 capsule 11  . Vitamin D3 (VITAMIN D) 25 MCG tablet Take 1 tablet (1,000 Units total) by mouth daily. 30 tablet 11  . amoxicillin-clavulanate (AUGMENTIN) 875-125 MG tablet Take 1 tablet by mouth 2 (two) times daily. (Patient not taking: Reported on 05/21/2020) 20 tablet 0   No facility-administered medications prior to visit.    Allergies  Allergen Reactions  . Flucloxacillin Other (See Comments)    Reaction unknown  . Nsaids Other (See Comments)    Only one kidney.    . Phenothiazines Other (See Comments)    Reaction unknown  . Ofloxacin Other (See Comments)    Reaction unknown    ROS Review of Systems    Objective:     Physical Exam Constitutional:      Appearance: Normal appearance. He is normal weight.  HENT:     Head: Normocephalic and atraumatic.     Right Ear: External ear normal.     Left Ear: Tympanic membrane, ear canal and external ear normal.     Ears:     Comments: Right canal is blocked by cerumen    Nose: Nose normal.     Mouth/Throat:     Mouth: Mucous membranes are moist.     Pharynx: Oropharynx is clear.  Eyes:     Extraocular Movements: Extraocular movements intact.     Conjunctiva/sclera: Conjunctivae normal.  Cardiovascular:     Rate and Rhythm: Normal rate and regular rhythm.     Pulses: Normal pulses.  Pulmonary:     Effort: Pulmonary effort is normal.     Breath sounds: Normal breath sounds.  Abdominal:     General: Abdomen is flat. Bowel sounds are normal.     Palpations: Abdomen is soft.     Comments: Old surgical scar.   Skin:    Findings: No rash.  Neurological:     Mental Status: He is alert.     BP 135/82   Pulse 82   Temp 97.6 F (36.4 C)   Resp 14   Ht 6' (1.829 m)   Wt 182 lb (82.6 kg)   SpO2 99%   BMI 24.68 kg/m  Wt Readings from Last 3 Encounters:  05/21/20 182 lb (82.6 kg)  02/07/20 180 lb (81.6 kg)  01/25/20 185 lb (83.9 kg)     There are no preventive care reminders to display for this patient.  There are no preventive care reminders to display for this patient.  Lab Results  Component Value Date   TSH 3.06 06/30/2017   Lab Results  Component Value Date   WBC 9.2 01/12/2020   HGB 15.4 01/12/2020   HCT 45.3 01/12/2020   MCV 95.8 01/12/2020   PLT 221 01/12/2020   Lab Results  Component Value Date   NA 140 01/12/2020   K 4.8 01/12/2020   CO2 22 01/12/2020   GLUCOSE 81 01/12/2020   BUN 27 (H) 01/12/2020   CREATININE 1.89 (H) 01/12/2020   BILITOT 0.4 01/12/2020   ALKPHOS 131 (H) 09/21/2017   AST 20 01/12/2020   ALT 15 01/12/2020   PROT 6.7 01/12/2020   ALBUMIN 4.1 09/21/2017   CALCIUM 9.5 01/12/2020   ANIONGAP 8  06/09/2019   Lab Results  Component Value Date   CHOL 167 04/21/2019   Lab Results  Component Value Date   HDL 42 04/21/2019   Lab Results  Component Value Date   LDLCALC  94 04/21/2019   Lab Results  Component Value Date   TRIG 221 (H) 04/21/2019   Lab Results  Component Value Date   CHOLHDL 4.0 04/21/2019   No results found for: HGBA1C    Assessment & Plan:   Problem List Items Addressed This Visit      Genitourinary   Chronic kidney disease (CKD) stage G3b/A1, moderately decreased glomerular filtration rate (GFR) between 30-44 mL/min/1.73 square meter and albuminuria creatinine ratio less than 30 mg/g (HCC)   Relevant Orders   BASIC METABOLIC PANEL WITH GFR   BPH (benign prostatic hyperplasia)   Relevant Orders   PSA    Other Visit Diagnoses    Encounter for Medicare annual wellness exam    -  Primary   Hearing loss due to cerumen impaction, left       Screen for colon cancer       Relevant Orders   Cologuard   Screening for prostate cancer       Relevant Orders   PSA      No orders of the defined types were placed in this encounter.   Follow-up: No follow-ups on file.    Nani Gasseratherine Gurjit Loconte, MD Cardiac Risk Factors include: male gender     Objective:    Today's Vitals   05/21/20 1343  BP: 135/82  Pulse: 82  Resp: 14  Temp: 97.6 F (36.4 C)  SpO2: 99%  Weight: 182 lb (82.6 kg)  Height: 6' (1.829 m)   Body mass index is 24.68 kg/m.  Advanced Directives 03/28/2015 03/27/2015 11/07/2013 03/16/2013 02/25/2013 02/24/2013  Does Patient Have a Medical Advance Directive? No No Patient has advance directive, copy not in chart Patient does not have advance directive Patient has advance directive, copy not in chart -  Type of Advance Directive - - Other (Comment) Healthcare Power of [employer] Healthcare Power of Attorney -  Does patient want to make changes to medical advance directive? - - No change requested No No -  Copy of Healthcare Power of  Attorney in Chart? - - Copy requested from family Copy requested from family Copy requested from family -  Would patient like information on creating a medical advance directive? No - patient declined information No - patient declined information - - - -  Pre-existing out of facility DNR order (yellow form or pink MOST form) - - No No No No    Current Medications (verified) Outpatient Encounter Medications as of 05/21/2020  Medication Sig  . Calcium Carbonate-Vitamin D 600-400 MG-UNIT tablet Take 1 tablet by mouth daily.  Marland Kitchen. docusate sodium (COLACE) 100 MG capsule Take 1 capsule (100 mg total) by mouth 2 (two) times daily.  Marland Kitchen. doxepin (SINEQUAN) 10 MG capsule Take 1 capsule (10 mg total) by mouth at bedtime. (2200)  . furosemide (LASIX) 20 MG tablet Take 1 tablet (20 mg total) by mouth every other day.  . Multiple Vitamin (MULTIVITAMIN WITH MINERALS) TABS tablet Take 1 tablet by mouth daily. (0800)  . polyethylene glycol powder (GLYCOLAX/MIRALAX) 17 GM/SCOOP powder Take 17 g by mouth daily as needed for moderate constipation or severe constipation. For constipation  . simvastatin (ZOCOR) 40 MG tablet Take 1 tablet (40 mg total) by mouth at bedtime.  . tamsulosin (FLOMAX) 0.4 MG CAPS capsule TAKE ONE CAPSULE BY MOUTH ONCE DAILY AFTER SUPPER  . Vitamin D3 (VITAMIN D) 25 MCG tablet Take 1 tablet (1,000 Units total) by mouth daily.  . [DISCONTINUED] amoxicillin-clavulanate (AUGMENTIN) 875-125 MG tablet Take 1 tablet by  mouth 2 (two) times daily. (Patient not taking: Reported on 05/21/2020)  . [DISCONTINUED] Calcium Carbonate-Vitamin D 600-400 MG-UNIT tablet Take 1 tablet by mouth daily.   No facility-administered encounter medications on file as of 05/21/2020.    Allergies (verified) Flucloxacillin, Nsaids, Phenothiazines, and Ofloxacin   History: Past Medical History:  Diagnosis Date  . Autism spectrum disorder   . Bowel obstruction (HCC)   . Broken leg Left  . CKD (chronic kidney disease)  stage 3, GFR 30-59 ml/min (HCC) 11/07/2013   patient has only one kidney  . Detached retina   . Hypercholesteremia   . Hyperlipidemia   . Malnutrition (HCC)   . Mental retardation   . Osteoporosis   . Thyroid disease    Past Surgical History:  Procedure Laterality Date  . ABDOMINAL SURGERY    . APPENDECTOMY    . DENTAL RESTORATION/EXTRACTION WITH X-RAY N/A 06/09/2019   Procedure: DENTAL RESTORATION/EXTRACTION Teeth 16, 17 WITH X-RAY Diagnostic, Dental Cleaning, Dental Fillings on Teeth 1,61,09,60;  Surgeon: Cristino Martes, DMD;  Location: [facility] OR;  Service: Dentistry;  Laterality: N/A;  . EYE SURGERY Left 03-14-2013   catatract  . LEG SURGERY Left    Family History  Problem Relation Age of Onset  . Hypertension Other   . Diabetes Other   . Hyperlipidemia Other   . Scoliosis Other    Social History   Socioeconomic History  . Marital status: Single    Spouse name: Not on file  . Number of children: Not on file  . Years of education: Not on file  . Highest education level: Not on file  Occupational History  . Not on file  Tobacco Use  . Smoking status: Never Smoker  . Smokeless tobacco: Never Used  Vaping Use  . Vaping Use: Never used  Substance and Sexual Activity  . Alcohol use: No  . Drug use: No  . Sexual activity: Never  Other Topics Concern  . Not on file  Social History Narrative   Disabled.  Completed 2nd grade.  Lives in a facility here in [place]. Brother lives nearby.     Social Determinants of Health   Financial Resource Strain: Not on file  Food Insecurity: Not on file  Transportation Needs: Not on file  Physical Activity: Not on file  Stress: Not on file  Social Connections: Not on file    Tobacco Counseling Counseling given: Not Answered   Clinical Intake:    Physical Exam Constitutional:      Appearance: Normal appearance. He is normal weight.  HENT:     Head: Normocephalic and atraumatic.     Right Ear: External ear normal.     Left Ear:  Tympanic membrane, ear canal and external ear normal.     Ears:     Comments: Right canal is blocked by cerumen    Nose: Nose normal.     Mouth/Throat:     Mouth: Mucous membranes are moist.     Pharynx: Oropharynx is clear.  Eyes:     Extraocular Movements: Extraocular movements intact.     Conjunctiva/sclera: Conjunctivae normal.  Cardiovascular:     Rate and Rhythm: Normal rate and regular rhythm.     Pulses: Normal pulses.  Pulmonary:     Effort: Pulmonary effort is normal.     Breath sounds: Normal breath sounds.  Abdominal:     General: Abdomen is flat. Bowel sounds are normal.     Palpations: Abdomen is soft.  Comments: Old surgical scar.   Skin:    Findings: No rash.  Neurological:     Mental Status: He is alert.               Diabetic?No          Activities of Daily Living In your present state of health, do you have any difficulty performing the following activities: 05/21/2020 06/09/2019  Hearing? N N  Vision? N N  Difficulty concentrating or making decisions? Malvin Johns  Walking or climbing stairs? N Y  Dressing or bathing? N Y  Doing errands, shopping? Y -  Quarry manager and eating ? Y -  Using the Toilet? N -  In the past six months, have you accidently leaked urine? N -  Do you have problems with loss of bowel control? N -  Managing your Medications? Y -  Managing your Finances? Y -  Housekeeping or managing your Housekeeping? Y -  Some recent data might be hidden    Patient Care Team: Agapito Games, MD as PCP - General (Family Medicine) Abran Cantor, MD as Referring Physician (Nephrology)  Indicate any recent Medical Services you may have received from other than [facility] providers in the past year (date may be approximate).     Assessment:   This is a routine wellness examination for [place].  Hearing/Vision screen No exam data present  Dietary issues and exercise activities discussed: Current Exercise Habits: The patient  does not participate in regular exercise at present, Exercise limited by: Other - see comments  Goals   None    Depression Screen PHQ 2/9 Scores 05/21/2020 01/25/2020 04/21/2019 02/22/2019 12/20/2018 12/30/2016  PHQ - 2 Score - - - - - 0  Exception Documentation Medical reason Medical reason Medical reason Medical reason Other- indicate reason in comment box -  Not completed - - - - pt not able to verbally communicate -    Fall Risk Fall Risk  12/20/2018 02/04/2018  Falls in the past year? 0 0  Number falls in past yr: 0 -  Injury with Fall? 0 -    FALL RISK PREVENTION PERTAINING TO THE HOME:  Any stairs in or around the home? No  If so, are there any without handrails? No  Home free of loose throw rugs in walkways, pet beds, electrical cords, etc? No  Adequate lighting in your home to reduce risk of falls? Yes   ASSISTIVE DEVICES UTILIZED TO PREVENT FALLS:  Life alert? No  Use of a cane, walker or w/c? No  Grab bars in the bathroom? No  Shower chair or bench in shower? No  Elevated toilet seat or a handicapped toilet? No   TIMED UP AND GO:  Gait slow and steady without use of assistive device  Cognitive Function:        Immunizations Immunization History  Administered Date(s) Administered  . Influenza,inj,Quad PF,6+ Mos 11/30/2014, 12/30/2016, 12/30/2017, 12/20/2018, 01/25/2020  . Influenza-Unspecified 11/29/2012, 01/09/2016  . Moderna Sars-Covid-2 Vaccination 04/16/2019  . PPD Test 02/08/2018  . Tdap 12/30/2016  . Zoster Recombinat (Shingrix) 04/20/2018, 10/11/2018    TDAP status: Up to date  Flu Vaccine status: Up to date  Pneumococcal vaccine status: Up to date  Covid-19 vaccine status: Completed vaccines  Qualifies for Shingles Vaccine? No   Shingrix Completed?: Yes  Screening Tests Health Maintenance  Topic Date Due  . COLONOSCOPY (Pts 45-50yrs Insurance coverage will need to be confirmed)  01/24/2021 (Originally 10/14/2002)  . COVID-19 Vaccine (2 -  Moderna 3-dose series) 01/24/2021 (Originally 05/14/2019)  . TETANUS/TDAP  12/31/2026  . INFLUENZA VACCINE  Completed  . Hepatitis C Screening  Completed  . HIV Screening  Completed    Health Maintenance  There are no preventive care reminders to display for this patient.   Unable to perform 6 cit because patient is nonverbal.  Colorectal cancer screening: Type of screening: Cologuard. Completed will order. Repeat every 3 years  Lung Cancer Screening: (Low Dose CT Chest recommended if Age 19-80 years, 30 pack-year currently smoking OR have quit w/in 15years.) does not qualify.   Lung Cancer Screening Referral: NA  Additional Screening:  Hepatitis C Screening: does not qualify; Completed Yes  Vision Screening: Recommended annual ophthalmology exams for early detection of glaucoma and other disorders of the eye. Is the patient up to date with their annual eye exam?  No   Dental Screening: Recommended annual dental exams for proper oral hygiene  Community Resource Referral / Chronic Care Management: CRR required this visit?  No   CCM required this visit?  No      Plan:     I have personally reviewed and noted the following in the patient's chart:   . Medical and social history . Use of alcohol, tobacco or illicit drugs  . Current medications and supplements . Functional ability and status . Nutritional status . Physical activity . Advanced directives . List of other physicians . Hospitalizations, surgeries, and ER visits in previous 12 months . Vitals . Screenings to include cognitive, depression, and falls . Referrals and appointments  In addition, I have reviewed and discussed with patient certain preventive protocols, quality metrics, and best practice recommendations. A written personalized care plan for preventive services as well as general preventive health recommendations were provided to patient.     [name], MD   05/21/2020

## 2020-05-22 LAB — BASIC METABOLIC PANEL WITH GFR
BUN/Creatinine Ratio: 16 (calc) (ref 6–22)
BUN: 30 mg/dL — ABNORMAL HIGH (ref 7–25)
CO2: 29 mmol/L (ref 20–32)
Calcium: 9.8 mg/dL (ref 8.6–10.3)
Chloride: 108 mmol/L (ref 98–110)
Creat: 1.83 mg/dL — ABNORMAL HIGH (ref 0.70–1.25)
GFR, Est African American: 45 mL/min/{1.73_m2} — ABNORMAL LOW (ref >=60)
GFR, Est Non African American: 39 mL/min/{1.73_m2} — ABNORMAL LOW (ref >=60)
Glucose, Bld: 95 mg/dL (ref 65–99)
Potassium: 4.9 mmol/L (ref 3.5–5.3)
Sodium: 144 mmol/L (ref 135–146)

## 2020-05-22 LAB — PSA: PSA: 2.72 ng/mL (ref <=4.0)

## 2020-06-04 ENCOUNTER — Other Ambulatory Visit: Payer: Self-pay | Admitting: Family Medicine

## 2020-08-04 ENCOUNTER — Other Ambulatory Visit: Payer: Self-pay | Admitting: Family Medicine

## 2020-08-04 DIAGNOSIS — F5101 Primary insomnia: Secondary | ICD-10-CM

## 2020-11-12 ENCOUNTER — Telehealth: Payer: Self-pay

## 2020-11-12 NOTE — Telephone Encounter (Signed)
Patients team lead left paperwork (Medical Assessment) to be filled out. Please contact the Brother @ [phone] Criss Alvine when the paperwork is complete. Paperwork is placed in the message folder.

## 2020-11-13 NOTE — Telephone Encounter (Signed)
Pt's forms have been completed.   Will give to Kenae Lindquist V. To contact pt's brother for f/u on this.

## 2020-11-13 NOTE — Telephone Encounter (Signed)
Left a message for patients brother, to let him know that they have to come in to fill out paperwork for [place] to release paperwork to the place where pt is currently residing. I, also let them know that the paperwork was completed,and they can come and get the paperwork and take it to the place where pt is currently residing.- tvt

## 2020-11-13 NOTE — Telephone Encounter (Signed)
Gave forms to the sister of the patient, whom I was told by Charna Archer, and Loralee Pacas that she was the POA of this patient. The sister stated that she will bring POA paperwork back to scan into patients file the next business day- tvt

## 2020-11-28 ENCOUNTER — Encounter: Payer: Self-pay | Admitting: Family Medicine

## 2021-01-23 ENCOUNTER — Other Ambulatory Visit: Payer: Self-pay | Admitting: Family Medicine

## 2021-01-23 DIAGNOSIS — E7800 Pure hypercholesterolemia, unspecified: Secondary | ICD-10-CM

## 2021-04-30 ENCOUNTER — Other Ambulatory Visit: Payer: Self-pay | Admitting: Family Medicine

## 2021-04-30 DIAGNOSIS — E7800 Pure hypercholesterolemia, unspecified: Secondary | ICD-10-CM

## 2021-06-04 ENCOUNTER — Other Ambulatory Visit: Payer: Self-pay | Admitting: Family Medicine

## 2021-06-04 DIAGNOSIS — E7800 Pure hypercholesterolemia, unspecified: Secondary | ICD-10-CM

## 2021-06-25 ENCOUNTER — Other Ambulatory Visit: Payer: Self-pay | Admitting: Family Medicine

## 2021-06-25 DIAGNOSIS — E7800 Pure hypercholesterolemia, unspecified: Secondary | ICD-10-CM

## 2021-06-29 ENCOUNTER — Other Ambulatory Visit: Payer: Self-pay | Admitting: Family Medicine

## 2021-07-04 ENCOUNTER — Other Ambulatory Visit: Payer: Self-pay | Admitting: Family Medicine

## 2021-07-29 ENCOUNTER — Other Ambulatory Visit: Payer: Self-pay | Admitting: Family Medicine

## 2021-07-29 DIAGNOSIS — E7800 Pure hypercholesterolemia, unspecified: Secondary | ICD-10-CM

## 2021-08-05 ENCOUNTER — Other Ambulatory Visit: Payer: Self-pay | Admitting: Family Medicine

## 2021-08-05 DIAGNOSIS — F5101 Primary insomnia: Secondary | ICD-10-CM

## 2021-08-07 ENCOUNTER — Ambulatory Visit: Payer: Medicare Other | Admitting: Family Medicine

## 2021-09-11 ENCOUNTER — Encounter: Payer: Self-pay | Admitting: Family Medicine

## 2021-09-11 ENCOUNTER — Ambulatory Visit (INDEPENDENT_AMBULATORY_CARE_PROVIDER_SITE_OTHER): Payer: Medicare Other | Admitting: Family Medicine

## 2021-09-11 VITALS — BP 135/95 | HR 102 | Ht 72.0 in | Wt 185.0 lb

## 2021-09-11 DIAGNOSIS — E7800 Pure hypercholesterolemia, unspecified: Secondary | ICD-10-CM | POA: Diagnosis not present

## 2021-09-11 DIAGNOSIS — Z0000 Encounter for general adult medical examination without abnormal findings: Secondary | ICD-10-CM | POA: Diagnosis not present

## 2021-09-11 DIAGNOSIS — N1832 Chronic kidney disease, stage 3b: Secondary | ICD-10-CM | POA: Diagnosis not present

## 2021-09-11 DIAGNOSIS — Z1211 Encounter for screening for malignant neoplasm of colon: Secondary | ICD-10-CM

## 2021-09-11 DIAGNOSIS — Z125 Encounter for screening for malignant neoplasm of prostate: Secondary | ICD-10-CM | POA: Diagnosis not present

## 2021-09-11 NOTE — Progress Notes (Signed)
Subjective:   Richard [name] is a 66 y.o. male who presents for Medicare Annual/Subsequent preventive examination.  Review of [place] who is with him from the adult home has no specific concerns.  He is nonverbal and currently lives in a group home.  She says he is up and walking around and active during the day as he has not been sick recently.  He does occasionally cough.  But not frequent.       Objective:    Today's Vitals   09/11/21 1035 09/11/21 1053  BP: (!) 151/101 (!) 135/95  Pulse: (!) 107 (!) 102  SpO2: 96%   Weight: 185 lb (83.9 kg)   Height: 6' (1.829 m)    Body mass index is 25.09 kg/m.     03/28/2015    6:59 AM 03/27/2015    6:26 PM 11/07/2013   11:11 AM 03/16/2013    7:34 PM 02/25/2013    3:00 PM 02/24/2013    7:35 PM  Advanced Directives  Does Patient Have a Medical Advance Directive? No No Patient has advance directive, copy not in chart Patient does not have advance directive Patient has advance directive, copy not in chart   Type of Advance Directive   Other (Comment) Healthcare Power of [place]   Does patient want to make changes to medical advance directive?   No change requested No No   Copy of Healthcare Power of Attorney in Chart?   Copy requested from family Copy requested from family Copy requested from family   Would patient like information on creating a medical advance directive? No - patient declined information No - patient declined information      Pre-existing out of facility DNR order (yellow form or pink MOST form)   No No No No    Current Medications (verified) Outpatient Encounter Medications as of 09/11/2021  Medication Sig   Calcium Carbonate-Vitamin D 600-400 MG-UNIT tablet Take 1 tablet by mouth daily.   docusate sodium (COLACE) 100 MG capsule Take 1 capsule (100 mg total) by mouth 2 (two) times daily.   doxepin (SINEQUAN) 10 MG capsule TAKE ONE CAPSULE BY MOUTH AT BEDTIME   furosemide  (LASIX) 20 MG tablet TAKE 1 TABLET BY MOUTH EVERY OTHER DAY **NO REFILLS** NEED APPOINTMENT FOR FURTHER REFILLS   Multiple Vitamin (MULTIVITAMIN WITH MINERALS) TABS tablet Take 1 tablet by mouth daily. (0800)   polyethylene glycol powder (GLYCOLAX/MIRALAX) 17 GM/SCOOP powder Take 17 g by mouth daily as needed for moderate constipation or severe constipation. For constipation   simvastatin (ZOCOR) 40 MG tablet TAKE 1 TABLET BY MOUTH AT BEDTIME **NO REFILLS**   tamsulosin (FLOMAX) 0.4 MG CAPS capsule TAKE ONE CAPSULE BY MOUTH ONCE DAILY AFTER SUPPER   Vitamin D3 (VITAMIN D) 25 MCG tablet Take 1 tablet (1,000 Units total) by mouth daily.   [DISCONTINUED] Calcium Carbonate-Vitamin D 600-400 MG-UNIT tablet Take 1 tablet by mouth daily.   No facility-administered encounter medications on file as of 09/11/2021.    Allergies (verified) Floxacillin (flucloxacillin), Nsaids, Phenothiazines, and Ofloxacin   History: Past Medical History:  Diagnosis Date   Autism spectrum disorder    Bowel obstruction (HCC)    Broken leg Left   CKD (chronic kidney disease) stage 3, GFR 30-59 ml/min ([place]) 11/07/2013   patient has only one kidney   Detached retina    Hypercholesteremia    Hyperlipidemia    Malnutrition ([place])    Mental retardation  Osteoporosis    Thyroid disease    Past Surgical History:  Procedure Laterality Date   ABDOMINAL SURGERY     APPENDECTOMY     DENTAL RESTORATION/EXTRACTION WITH X-RAY N/A 06/09/2019   Procedure: DENTAL RESTORATION/EXTRACTION Teeth 16, 17 WITH X-RAY Diagnostic, Dental Cleaning, Dental Fillings on Teeth 2,23,36,12;  Surgeon: Cristino Martes, DMD;  Location: [facility] OR;  Service: Dentistry;  Laterality: N/A;   EYE SURGERY Left 03-14-2013   catatract   LEG SURGERY Left    Family History  Problem Relation Age of Onset   Hypertension Other    Diabetes Other    Hyperlipidemia Other    Scoliosis Other    Social History   Socioeconomic History   Marital status: Single     Spouse name: Not on file   Number of children: Not on file   Years of education: Not on file   Highest education level: Not on file  Occupational History   Not on file  Tobacco Use   Smoking status: Never   Smokeless tobacco: Never  Vaping Use   Vaping Use: Never used  Substance and Sexual Activity   Alcohol use: No   Drug use: No   Sexual activity: Never  Other Topics Concern   Not on file  Social History Narrative   Disabled.  Completed 2nd grade.  Lives in a facility here in [place]. Brother lives nearby.     Social Determinants of Health   Financial Resource Strain: Not on file  Food Insecurity: Not on file  Transportation Needs: Not on file  Physical Activity: Not on file  Stress: Not on file  Social Connections: Not on file    Tobacco Counseling Counseling given: Not Answered   Clinical Intake:  Pre-visit preparation completed: Yes  Pain : No/denies pain     BMI - recorded: 25 Nutritional Status: BMI 25 -29 Overweight  How often do you need to have someone help you when you read instructions, pamphlets, or other written materials from your doctor or pharmacy?: 5 - Always  Diabetic?No     Comments: Non verbal   Activities of Daily Living    09/11/2021   11:08 AM  In your present state of health, do you have any difficulty performing the following activities:  Hearing? 0  Vision? 0  Difficulty concentrating or making decisions? 1  Walking or climbing stairs? 1  Dressing or bathing? 1  Doing errands, shopping? 1  Preparing Food and eating ? Y  Using the Toilet? N  Managing your Medications? Y  Managing your Finances? Y  Housekeeping or managing your Housekeeping? Y    Patient Care Team: Agapito Games, MD as PCP - General (Family Medicine) Abran Cantor, MD as Referring Physician (Nephrology)  Indicate any recent Medical Services you may have received from other than [facility] providers in the past year (date may be approximate).      Assessment:   This is a routine wellness examination for [place].  Hearing/Vision screen No results found.  Dietary issues and exercise activities discussed: Current Exercise Habits: The patient does not participate in regular exercise at present, Exercise limited by: psychological condition(s);neurologic condition(s)   Goals Addressed             This Visit's Progress    Exercise 150 min/wk Moderate Activity       Please stay active.         Depression Screen    09/11/2021   10:39 AM 05/21/2020  1:47 PM 01/25/2020   10:30 AM 04/21/2019   11:06 AM 02/22/2019    2:03 PM 12/20/2018    1:00 PM 12/30/2016   11:15 AM  PHQ 2/9 Scores  PHQ - 2 Score       0  Exception Documentation Medical reason Medical reason Medical reason Medical reason Medical reason Other- indicate reason in comment box   Not completed      pt not able to verbally communicate     Fall Risk    09/11/2021   10:39 AM 12/20/2018    1:00 PM 02/04/2018   12:35 PM  [place] in the past year? 0 0 0  Number falls in past yr: 0 0   Injury with Fall? 0 0   Risk for fall due to : No Fall Risks    Follow up Falls prevention discussed      [place]:  Any stairs in or around the home? No  If so, are there any without handrails?  NA Home free of loose throw rugs in walkways, pet beds, electrical cords, etc? Yes  Adequate lighting in your home to reduce risk of falls? Yes   ASSISTIVE DEVICES UTILIZED TO PREVENT FALLS:  Life alert? No  Use of a cane, walker or w/c? No  Shower chair or bench in shower? No  Elevated toilet seat or a handicapped toilet? No   TIMED UP AND GO:  Was the test performed? No .   Gait slow and steady without use of assistive device  Cognitive Function:    09/11/2021   11:08 AM  MMSE - Mini Mental State Exam  Not completed: Unable to complete        Immunizations Immunization History  Administered Date(s) Administered    Influenza,inj,Quad PF,6+ Mos 11/30/2014, 12/30/2016, 12/30/2017, 12/20/2018, 01/25/2020   Influenza-Unspecified 11/29/2012, 01/09/2016   Moderna Sars-Covid-2 Vaccination 04/16/2019   PPD Test 02/08/2018   Tdap 12/30/2016   Zoster Recombinat (Shingrix) 04/20/2018, 10/11/2018    TDAP status: Up to date  Flu Vaccine status: Up to date  Pneumococcal vaccine status: Up to date  Covid-19 vaccine status: Completed vaccines  Qualifies for Shingles Vaccine?  Shingrix Completed?: Yes  Screening Tests Health Maintenance  Topic Date Due   COLONOSCOPY (Pts 45-62yrs Insurance coverage will need to be confirmed)  Never done   COVID-19 Vaccine (2 - Moderna series) 06/11/2019   INFLUENZA VACCINE  10/29/2021   TETANUS/TDAP  12/31/2026   Hepatitis C Screening  Completed   HIV Screening  Completed   Zoster Vaccines- Shingrix  Completed   Pneumococcal Vaccine 86-18 Years old  Aged Out   HPV VACCINES  Aged Out    Health Maintenance  Health Maintenance Due  Topic Date Due   COLONOSCOPY (Pts 45-59yrs Insurance coverage will need to be confirmed)  Never done   COVID-19 Vaccine (2 - Moderna series) 06/11/2019    Colorectal cancer screening: Type of screening: Cologuard. Completed  - ordered. Repeat every 3 years  Lung Cancer Screening: (Low Dose CT Chest recommended if Age 12-80 years, 30 pack-year currently smoking OR have quit w/in 15years.) does not qualify.   Lung Cancer Screening Referral: N/A  Additional Screening:  Hepatitis C Screening: completed  Vision Screening: Recommended annual ophthalmology exams for early detection of glaucoma and other disorders of the eye. Is the patient up to date with their annual eye exam?  Yes    Dental Screening: Recommended annual dental exams for  proper oral hygiene  Community Resource Referral / Chronic Care Management: CRR required this visit?  No   CCM required this visit?  No      Plan:     I have personally reviewed and noted the  following in the patient's chart:   Medical and social history Use of alcohol, tobacco or illicit drugs  Current medications and supplements including opioid prescriptions. Patient is not currently taking opioid prescriptions. Functional ability and status Nutritional status Physical activity Advanced directives List of other physicians Hospitalizations, surgeries, and ER visits in previous 12 months Vitals Screenings to include cognitive, depression, and falls Referrals and appointments  In addition, I have reviewed and discussed with patient certain preventive protocols, quality metrics, and best practice recommendations. A written personalized care plan for preventive services as well as general preventive health recommendations were provided to patient.   Orders Placed This Encounter  Procedures   COMPLETE METABOLIC PANEL WITH GFR   Lipid Panel w/reflex Direct LDL   CBC   PSA   Cologuard     Beatrice Lecher, MD   09/11/2021   Nurse Notes:

## 2021-09-11 NOTE — Progress Notes (Signed)
We also discussed that he seems to be sleeping a little excessively lately.  They are having to wake him up and get him out of bed.  So we will discontinue the doxepin for now this was started previously because he was having a hard time falling asleep.  We can always add it back if needed but we will discontinue it for now.  Also brought an updated form with medications and as needed medications.  Sign those forms and copy placed in our chart.  Originals given back to [place] who came with him today.

## 2021-09-11 NOTE — Patient Instructions (Addendum)
  Mr. Richard [name] , Thank you for taking time to come for your Medicare Wellness Visit. I appreciate your ongoing commitment to your health goals. Please review the following plan we discussed and let me know if I can assist you in the future.   These are the goals we discussed:  Goals      Exercise 150 min/wk Moderate Activity     Please stay active.          This is a list of the screening recommended for you and due dates:  Health Maintenance  Topic Date Due   Colon Cancer Screening  Never done   COVID-19 Vaccine (2 - Moderna series) 06/11/2019   Flu Shot  10/29/2021   Tetanus Vaccine  12/31/2026   Hepatitis C Screening: USPSTF Recommendation to screen - Ages 18-79 yo.  Completed   HIV Screening  Completed   Zoster (Shingles) Vaccine  Completed   Pneumococcal Vaccination  Aged Out   HPV Vaccine  Aged Out

## 2021-09-12 LAB — LIPID PANEL W/REFLEX DIRECT LDL
Cholesterol: 180 mg/dL (ref <200)
HDL: 46 mg/dL (ref >=40)
LDL Cholesterol (Calc): 105 mg/dL (calc) — ABNORMAL HIGH
Non-HDL Cholesterol (Calc): 134 mg/dL (calc) — ABNORMAL HIGH (ref <130)
Total CHOL/HDL Ratio: 3.9 (calc) (ref <5.0)
Triglycerides: 175 mg/dL — ABNORMAL HIGH (ref <150)

## 2021-09-12 LAB — CBC
HCT: 47.6 % (ref 38.5–50.0)
Hemoglobin: 15.9 g/dL (ref 13.2–17.1)
MCH: 31.9 pg (ref 27.0–33.0)
MCHC: 33.4 g/dL (ref 32.0–36.0)
MCV: 95.6 fL (ref 80.0–100.0)
MPV: 12.6 fL — ABNORMAL HIGH (ref 7.5–12.5)
Platelets: 235 10*3/uL (ref 140–400)
RBC: 4.98 10*6/uL (ref 4.20–5.80)
RDW: 12.5 % (ref 11.0–15.0)
WBC: 6 10*3/uL (ref 3.8–10.8)

## 2021-09-12 LAB — COMPLETE METABOLIC PANEL WITH GFR
AG Ratio: 1.7 (calc) (ref 1.0–2.5)
ALT: 14 U/L (ref 9–46)
AST: 17 U/L (ref 10–35)
Albumin: 4.3 g/dL (ref 3.6–5.1)
Alkaline phosphatase (APISO): 87 U/L (ref 35–144)
BUN/Creatinine Ratio: 15 (calc) (ref 6–22)
BUN: 32 mg/dL — ABNORMAL HIGH (ref 7–25)
CO2: 24 mmol/L (ref 20–32)
Calcium: 9.6 mg/dL (ref 8.6–10.3)
Chloride: 105 mmol/L (ref 98–110)
Creat: 2.19 mg/dL — ABNORMAL HIGH (ref 0.70–1.35)
Globulin: 2.6 g/dL (calc) (ref 1.9–3.7)
Glucose, Bld: 201 mg/dL — ABNORMAL HIGH (ref 65–139)
Potassium: 4.3 mmol/L (ref 3.5–5.3)
Sodium: 140 mmol/L (ref 135–146)
Total Bilirubin: 0.5 mg/dL (ref 0.2–1.2)
Total Protein: 6.9 g/dL (ref 6.1–8.1)
eGFR: 33 mL/min/{1.73_m2} — ABNORMAL LOW (ref >=60)

## 2021-09-12 LAB — PSA: PSA: 2.85 ng/mL (ref <=4.00)

## 2021-09-12 NOTE — Progress Notes (Signed)
Please notify Charlie's brother, kidney function did go up slightly but he also looked a little bit more dry on his labs.  I would like to recheck this again in 6 months since it was a slight shift.  Liver enzymes are normal.  No anemia.  Cholesterol just a little borderline.  Prostate test is normal.

## 2021-10-17 ENCOUNTER — Encounter (HOSPITAL_BASED_OUTPATIENT_CLINIC_OR_DEPARTMENT_OTHER): Payer: Self-pay | Admitting: Emergency Medicine

## 2021-10-17 ENCOUNTER — Other Ambulatory Visit: Payer: Self-pay

## 2021-10-17 ENCOUNTER — Emergency Department (HOSPITAL_BASED_OUTPATIENT_CLINIC_OR_DEPARTMENT_OTHER): Payer: Medicare Other

## 2021-10-17 ENCOUNTER — Emergency Department (HOSPITAL_BASED_OUTPATIENT_CLINIC_OR_DEPARTMENT_OTHER)
Admission: EM | Admit: 2021-10-17 | Discharge: 2021-10-17 | Disposition: A | Discharge status: Home / Self Care | Payer: Medicare Other | Attending: Emergency Medicine | Admitting: Emergency Medicine

## 2021-10-17 DIAGNOSIS — W19XXXA Unspecified fall, initial encounter: Secondary | ICD-10-CM | POA: Insufficient documentation

## 2021-10-17 DIAGNOSIS — S0993XA Unspecified injury of face, initial encounter: Secondary | ICD-10-CM | POA: Diagnosis present

## 2021-10-17 DIAGNOSIS — S8001XA Contusion of right knee, initial encounter: Secondary | ICD-10-CM | POA: Insufficient documentation

## 2021-10-17 DIAGNOSIS — S0083XA Contusion of other part of head, initial encounter: Secondary | ICD-10-CM | POA: Insufficient documentation

## 2021-10-17 NOTE — ED Triage Notes (Signed)
Pt from group home.  Tripped and fell. Hit nose and right knee.

## 2021-10-17 NOTE — Discharge Instructions (Signed)
There is no significant abnormality found on your CT imaging.  Please hold your family doctor in the office.  Please return to the ED for headache confusion intractable vomiting.

## 2021-10-17 NOTE — ED Provider Notes (Signed)
[facility] EMERGENCY DEPARTMENT Provider Note   CSN: [id number] Arrival date & time: 10/17/21  9833     History  Chief Complaint  Patient presents with   Marletta Lor    Richard [name] is a 66 y.o. male.  66 yo M with a chief complaint of a fall.  Nonsyncopal by history.  Patient is nonverbal at baseline.  Level 5 caveat.  Per caregiver has bruising to the right knee and some bruising about the face since was brought here for evaluation.  Has been acting normally known nausea and vomiting or change from baseline.   Fall       Home Medications Prior to Admission medications   Medication Sig Start Date End Date Taking? Authorizing Provider  Calcium Carbonate-Vitamin D 600-400 MG-UNIT tablet Take 1 tablet by mouth daily. 02/07/20   Agapito Games, MD  docusate sodium (COLACE) 100 MG capsule Take 1 capsule (100 mg total) by mouth 2 (two) times daily. 02/07/20   Agapito Games, MD  doxepin (SINEQUAN) 10 MG capsule TAKE ONE CAPSULE BY MOUTH AT BEDTIME 08/05/21   Agapito Games, MD  furosemide (LASIX) 20 MG tablet TAKE 1 TABLET BY MOUTH EVERY OTHER DAY **NO REFILLS** NEED APPOINTMENT FOR FURTHER REFILLS 08/05/21   Agapito Games, MD  Multiple Vitamin (MULTIVITAMIN WITH MINERALS) TABS tablet Take 1 tablet by mouth daily. (0800) 02/07/20   Agapito Games, MD  polyethylene glycol powder (GLYCOLAX/MIRALAX) 17 GM/SCOOP powder Take 17 g by mouth daily as needed for moderate constipation or severe constipation. For constipation 02/07/20   Agapito Games, MD  simvastatin (ZOCOR) 40 MG tablet TAKE 1 TABLET BY MOUTH AT BEDTIME **NO REFILLS** 07/30/21   Agapito Games, MD  tamsulosin (FLOMAX) 0.4 MG CAPS capsule TAKE ONE CAPSULE BY MOUTH ONCE DAILY AFTER SUPPER 02/07/20   Agapito Games, MD  Vitamin D3 (VITAMIN D) 25 MCG tablet Take 1 tablet (1,000 Units total) by mouth daily. 02/07/20   Agapito Games, MD  Calcium Carbonate-Vitamin D  600-400 MG-UNIT tablet Take 1 tablet by mouth daily. 12/30/16 02/07/20  Agapito Games, MD      Allergies    Floxacillin (flucloxacillin), Nsaids, Phenothiazines, and Ofloxacin    Review of Systems   Review of Systems  Physical Exam Updated Vital Signs BP (!) 164/88   Pulse 90   Temp 98.4 F (36.9 C)   Resp 16   Ht 6' (1.829 m)   Wt 83.9 kg   SpO2 99%   BMI 25.09 kg/m  Physical Exam Vitals and nursing note reviewed.  Constitutional:      Appearance: He is well-developed.  HENT:     Head: Normocephalic.     Comments: Bruising about the mid face.  No nasal septal hematoma. Eyes:     Pupils: Pupils are equal, round, and reactive to light.  Neck:     Vascular: No JVD.  Cardiovascular:     Rate and Rhythm: Normal rate and regular rhythm.     Heart sounds: No murmur heard.    No friction rub. No gallop.  Pulmonary:     Effort: No respiratory distress.     Breath sounds: No wheezing.  Abdominal:     General: There is no distension.     Tenderness: There is no abdominal tenderness. There is no guarding or rebound.  Musculoskeletal:        General: Normal range of motion.     Cervical back: Normal range of motion  and neck supple.     Comments: Palpated from head to toe without obvious noted bony tenderness.  He does have some small bruising to the anterior aspect of the right knee.  Full range of motion.  Stands and ambulates without issue.  Skin:    Coloration: Skin is not pale.     Findings: No rash.  Neurological:     Mental Status: He is alert.  Psychiatric:        Behavior: Behavior normal.     ED Results / Procedures / Treatments   Labs (all labs ordered are listed, but only abnormal results are displayed) Labs Reviewed - No data to display  EKG None  Radiology CT Head Wo Contrast  Result Date: 10/17/2021 CLINICAL DATA:  Fall this morning and hit head and face. EXAM: CT HEAD WITHOUT CONTRAST CT MAXILLOFACIAL WITHOUT CONTRAST TECHNIQUE:  Multidetector CT imaging of the head and maxillofacial structures were performed using the standard protocol without intravenous contrast. Multiplanar CT image reconstructions of the maxillofacial structures were also generated. RADIATION DOSE REDUCTION: This exam was performed according to the departmental dose-optimization program which includes automated exposure control, adjustment of the mA and/or kV according to patient size and/or use of iterative reconstruction technique. COMPARISON:  None Available. FINDINGS: CT HEAD FINDINGS Brain: No evidence of acute infarction, hemorrhage, hydrocephalus, extra-axial collection or mass lesion/mass effect. Prominence of the ventricles and sulci secondary to mild cerebral volume loss. Vascular: No hyperdense vessel or unexpected calcification. Skull: Normal. Negative for fracture or focal lesion. Other: None. CT MAXILLOFACIAL FINDINGS Osseous: No fracture or mandibular dislocation. No destructive process. Patient motion however limits evaluation. Orbits: Hyperdense right globe. No traumatic or inflammatory findings. Sinuses: Mucosal thickening of the bilateral maxillary sinuses and ethmoid air cells. Remaining paranasal sinuses and mastoid air cells are clear. Soft tissues: No significant soft tissue injury or hematoma. IMPRESSION: CT head: 1.  No acute intracranial abnormality. 2.  Mild cerebral volume loss. CT maxillofacial: 1. No acute fracture or dislocation. Evaluation is suboptimal due to patient motion. 2. Orbits are unremarkable without evidence of acute abnormality. Hyperdense right globe, likely chronic process. 3.  Moderate paranasal sinus disease, likely a chronic process. 4. No significant soft tissue injury to suggest hematoma or fluid collection. Electronically Signed   By: Larose Hires D.O.   On: 10/17/2021 11:18   CT Maxillofacial Wo Contrast  Result Date: 10/17/2021 CLINICAL DATA:  Fall this morning and hit head and face. EXAM: CT HEAD WITHOUT  CONTRAST CT MAXILLOFACIAL WITHOUT CONTRAST TECHNIQUE: Multidetector CT imaging of the head and maxillofacial structures were performed using the standard protocol without intravenous contrast. Multiplanar CT image reconstructions of the maxillofacial structures were also generated. RADIATION DOSE REDUCTION: This exam was performed according to the departmental dose-optimization program which includes automated exposure control, adjustment of the mA and/or kV according to patient size and/or use of iterative reconstruction technique. COMPARISON:  None Available. FINDINGS: CT HEAD FINDINGS Brain: No evidence of acute infarction, hemorrhage, hydrocephalus, extra-axial collection or mass lesion/mass effect. Prominence of the ventricles and sulci secondary to mild cerebral volume loss. Vascular: No hyperdense vessel or unexpected calcification. Skull: Normal. Negative for fracture or focal lesion. Other: None. CT MAXILLOFACIAL FINDINGS Osseous: No fracture or mandibular dislocation. No destructive process. Patient motion however limits evaluation. Orbits: Hyperdense right globe. No traumatic or inflammatory findings. Sinuses: Mucosal thickening of the bilateral maxillary sinuses and ethmoid air cells. Remaining paranasal sinuses and mastoid air cells are clear. Soft tissues: No significant soft tissue  injury or hematoma. IMPRESSION: CT head: 1.  No acute intracranial abnormality. 2.  Mild cerebral volume loss. CT maxillofacial: 1. No acute fracture or dislocation. Evaluation is suboptimal due to patient motion. 2. Orbits are unremarkable without evidence of acute abnormality. Hyperdense right globe, likely chronic process. 3.  Moderate paranasal sinus disease, likely a chronic process. 4. No significant soft tissue injury to suggest hematoma or fluid collection. Electronically Signed   By: Larose Hires D.O.   On: 10/17/2021 11:18    Procedures Procedures    Medications Ordered in ED Medications - No data to  display  ED Course/ Medical Decision Making/ A&P                           Medical Decision Making Amount and/or Complexity of Data Reviewed Radiology: ordered.   66 yo M with a significant past medical history of intellectual disability, nonverbal at baseline here with a chief complaint of a fall.  As patient is not able to communicate we will obtain a CT of the head and face.  CT of the head and face independently interpreted by me without obvious intracranial hemorrhage or facial fracture.  Patient continues to be at baseline.  Discharge home.  11:41 AM:  I have discussed the diagnosis/risks/treatment options with the patient and caregiver.  Evaluation and diagnostic testing in the emergency department does not suggest an emergent condition requiring admission or immediate intervention beyond what has been performed at this time.  They will follow up with  PCP. We also discussed returning to the ED immediately if new or worsening sx occur. We discussed the sx which are most concerning (e.g., sudden worsening pain, fever, inability to tolerate by mouth, confusion, vomiting) that necessitate immediate return. Medications administered to the patient during their visit and any new prescriptions provided to the patient are listed below.  Medications given during this visit Medications - No data to display   The patient appears reasonably screen and/or stabilized for discharge and I doubt any other medical condition or other [facility] requiring further screening, evaluation, or treatment in the ED at this time prior to discharge.           Final Clinical Impression(s) / ED Diagnoses Final diagnoses:  Fall, initial encounter  Facial bruising, initial encounter    Rx / DC Orders ED Discharge Orders     None         Melene Plan, DO 10/17/21 1141

## 2021-10-18 ENCOUNTER — Encounter: Payer: Self-pay | Admitting: Family Medicine

## 2021-10-20 LAB — COLOGUARD: COLOGUARD: NEGATIVE

## 2021-10-21 NOTE — Telephone Encounter (Signed)
From completed. Placed in fax box.

## 2021-10-21 NOTE — Progress Notes (Signed)
Great news! Your Cologuard test is negative.  Recommend repeat colon cancer screening in 3 years.

## 2022-01-03 ENCOUNTER — Telehealth: Payer: Self-pay | Admitting: Family Medicine

## 2022-01-03 NOTE — Telephone Encounter (Signed)
Caretaker of patient dropped off paperwork for physician's orders for medication to be signed by PCP and faxed at [phone] and, if fax is successful, no physical copy desired. Caretaker notified of potential fees and wait time. Paperwork placed in provider's box. Buel Ream

## 2022-01-06 NOTE — Telephone Encounter (Signed)
Paperwork completed and placed in [employer].

## 2022-01-07 NOTE — Telephone Encounter (Signed)
Paperwork faxed to corporate number on forms [phone] with confirmation received. Faxed to local number provided and fax would not go through. Copy to hold, copy sent to scan.

## 2022-04-02 ENCOUNTER — Encounter: Payer: Self-pay | Admitting: Family Medicine

## 2022-04-02 ENCOUNTER — Ambulatory Visit (INDEPENDENT_AMBULATORY_CARE_PROVIDER_SITE_OTHER): Payer: Medicare Other | Admitting: Family Medicine

## 2022-04-02 VITALS — BP 131/79 | HR 95 | Ht 72.0 in | Wt 195.0 lb

## 2022-04-02 DIAGNOSIS — N1832 Chronic kidney disease, stage 3b: Secondary | ICD-10-CM

## 2022-04-02 DIAGNOSIS — Z23 Encounter for immunization: Secondary | ICD-10-CM

## 2022-04-02 DIAGNOSIS — K5904 Chronic idiopathic constipation: Secondary | ICD-10-CM

## 2022-04-02 DIAGNOSIS — E7800 Pure hypercholesterolemia, unspecified: Secondary | ICD-10-CM | POA: Diagnosis not present

## 2022-04-02 DIAGNOSIS — N401 Enlarged prostate with lower urinary tract symptoms: Secondary | ICD-10-CM | POA: Diagnosis not present

## 2022-04-02 DIAGNOSIS — F79 Unspecified intellectual disabilities: Secondary | ICD-10-CM

## 2022-04-02 NOTE — Assessment & Plan Note (Signed)
On a softener twice a day and then has MiraLAX as needed.

## 2022-04-02 NOTE — Progress Notes (Signed)
   Established Patient Office Visit  Subjective   Patient ID: Richard [name], male    DOB: 08-Dec-1955  Age: 67 y.o. MRN: [id number]  Chief Complaint  Patient presents with   Follow-up         HPI  Today with caregiver.  Has forms that need to be completed as well.  F/U CKD 3 - due to recheck renal function.  No recent changes.  Hyperlipidemia - tolerating stating well with no myalgias or significant side effects.  Lab Results  Component Value Date   CHOL 180 09/11/2021   HDL 46 09/11/2021   LDLCALC 105 (H) 09/11/2021   TRIG 175 (H) 09/11/2021   CHOLHDL 3.9 09/11/2021   In general he is actually doing really well.  He is sleeping well without any sleep aid.  No recent concerns with his chronic constipation.  He has a good appetite.  And he has not been sick recently.     ROS    Objective:     BP 131/79   Pulse 95   Ht 6' (1.829 m)   Wt 195 lb (88.5 kg)   SpO2 98%   BMI 26.45 kg/m    Physical Exam Constitutional:      Appearance: He is well-developed.  HENT:     Head: Normocephalic and atraumatic.     Right Ear: External ear normal.     Left Ear: External ear normal.  Cardiovascular:     Rate and Rhythm: Normal rate and regular rhythm.     Heart sounds: Normal heart sounds.  Pulmonary:     Effort: Pulmonary effort is normal.     Breath sounds: Normal breath sounds.  Musculoskeletal:     Cervical back: Neck supple. No tenderness.  Lymphadenopathy:     Cervical: No cervical adenopathy.  Skin:    General: Skin is warm and dry.  Neurological:     Mental Status: He is alert and oriented to person, place, and time.  Psychiatric:        Behavior: Behavior normal.      No results found for any visits on 04/02/22.    The 10-year ASCVD risk score (Arnett DK, et al., 2019) is: 13.9%    Assessment & Plan:   Problem List Items Addressed This Visit       Genitourinary   Chronic kidney disease (CKD) stage G3b/A1, moderately decreased glomerular  filtration rate (GFR) between 30-44 mL/min/1.73 square meter and albuminuria creatinine ratio less than 30 mg/g ([place]) - Primary    Plan to recheck renal function.  Usually followed by nephrology yearly.      Relevant Orders   BASIC METABOLIC PANEL WITH GFR   BPH (benign prostatic hyperplasia)    Continue with flomax.  No concerns with urination.        Other   Intellectual disability    He is nonverbal.  Needs assistance with self-care.      Hyperlipidemia    Continue daily statin.  Lipid levels up-to-date.      Constipation    On a softener twice a day and then has MiraLAX as needed.       Brought in forms.  Updated and signed.  Confirmed medication list.  Vaccine given today.  Return in about 6 months (around 10/01/2022) for kidney function and recheck labs. Beatrice Lecher, MD

## 2022-04-02 NOTE — Assessment & Plan Note (Addendum)
Continue with flomax.  No concerns with urination.

## 2022-04-02 NOTE — Assessment & Plan Note (Signed)
He is nonverbal.  Needs assistance with self-care.

## 2022-04-02 NOTE — Assessment & Plan Note (Signed)
Plan to recheck renal function.  Usually followed by nephrology yearly.

## 2022-04-02 NOTE — Assessment & Plan Note (Signed)
Continue daily statin.  Lipid levels up-to-date.

## 2022-04-03 ENCOUNTER — Encounter: Payer: Self-pay | Admitting: Family Medicine

## 2022-04-03 LAB — BASIC METABOLIC PANEL WITH GFR
BUN/Creatinine Ratio: 14 (calc) (ref 6–22)
BUN: 29 mg/dL — ABNORMAL HIGH (ref 7–25)
CO2: 25 mmol/L (ref 20–32)
Calcium: 9.7 mg/dL (ref 8.6–10.3)
Chloride: 107 mmol/L (ref 98–110)
Creat: 2.14 mg/dL — ABNORMAL HIGH (ref 0.70–1.35)
Glucose, Bld: 143 mg/dL — ABNORMAL HIGH (ref 65–99)
Potassium: 4.6 mmol/L (ref 3.5–5.3)
Sodium: 142 mmol/L (ref 135–146)
eGFR: 34 mL/min/{1.73_m2} — ABNORMAL LOW (ref >=60)

## 2022-04-03 NOTE — Progress Notes (Signed)
Kidney function is stable.  

## 2022-07-17 ENCOUNTER — Encounter: Payer: Self-pay | Admitting: Family Medicine

## 2022-07-18 ENCOUNTER — Encounter: Payer: Self-pay | Admitting: Family Medicine

## 2022-07-18 NOTE — Telephone Encounter (Signed)
Form completed and given to Ang Earna Coder

## 2022-08-25 ENCOUNTER — Other Ambulatory Visit: Payer: Self-pay | Admitting: Family Medicine

## 2022-08-25 DIAGNOSIS — E7800 Pure hypercholesterolemia, unspecified: Secondary | ICD-10-CM

## 2022-08-25 DIAGNOSIS — N401 Enlarged prostate with lower urinary tract symptoms: Secondary | ICD-10-CM

## 2022-09-25 ENCOUNTER — Ambulatory Visit (INDEPENDENT_AMBULATORY_CARE_PROVIDER_SITE_OTHER): Payer: Medicare Other | Admitting: Family Medicine

## 2022-09-25 ENCOUNTER — Encounter: Payer: Self-pay | Admitting: Family Medicine

## 2022-09-25 VITALS — BP 134/78 | HR 89 | Temp 97.9°F | Resp 14 | Ht 72.0 in | Wt 192.0 lb

## 2022-09-25 DIAGNOSIS — N401 Enlarged prostate with lower urinary tract symptoms: Secondary | ICD-10-CM | POA: Diagnosis not present

## 2022-09-25 DIAGNOSIS — Z0000 Encounter for general adult medical examination without abnormal findings: Secondary | ICD-10-CM

## 2022-09-25 DIAGNOSIS — E7800 Pure hypercholesterolemia, unspecified: Secondary | ICD-10-CM | POA: Diagnosis not present

## 2022-09-25 DIAGNOSIS — H1031 Unspecified acute conjunctivitis, right eye: Secondary | ICD-10-CM

## 2022-09-25 DIAGNOSIS — N1832 Chronic kidney disease, stage 3b: Secondary | ICD-10-CM

## 2022-09-25 MED ORDER — AZITHROMYCIN 1 % OP SOLN: 1.0000 [drp] | Freq: Two times a day (BID) | OPHTHALMIC | 0 refills | Status: DC

## 2022-09-25 MED ORDER — AZITHROMYCIN 1 % OP SOLN
1.0000 [drp] | Freq: Two times a day (BID) | OPHTHALMIC | 0 refills | Status: DC
Start: 2022-09-25 — End: 2022-09-25

## 2022-09-25 MED ORDER — AZITHROMYCIN 1 % OP SOLN
1.0000 [drp] | Freq: Two times a day (BID) | OPHTHALMIC | 0 refills | Status: AC
Start: 2022-09-25 — End: ?

## 2022-09-25 NOTE — Progress Notes (Signed)
Annual Wellness Visit     Patient: Richard [name], Male    DOB: 30-May-1955, 67 y.o.   MRN: [id number]  Subjective  Chief Complaint  Patient presents with   Annual Exam    Richard [name] is a 67 y.o. male who presents today for his Annual Wellness Visit. He reports consuming a general diet. The patient does not participate in regular exercise at present. He generally feels well. He reports sleeping fairly well. He does not have additional problems to discuss today.  Still get some occasional lower extremity edema it always seems to be a little bit more in the evenings and less in the mornings.  He does take furosemide every other day.  Sleeps well in general.  No specific concerns.  The care provider who is with him today who usually works in the evening shift had noticed that his eye looked a little red last night on the right side.  HPI       Medications: Outpatient Medications Prior to Visit  Medication Sig   Calcium Carbonate-Vitamin D 600-400 MG-UNIT tablet Take 1 tablet by mouth daily.   docusate sodium (COLACE) 100 MG capsule Take 1 capsule (100 mg total) by mouth 2 (two) times daily.   furosemide (LASIX) 20 MG tablet TAKE 1 TABLET BY MOUTH EVERY OTHER DAY   Multiple Vitamin (MULTIVITAMIN WITH MINERALS) TABS tablet Take 1 tablet by mouth daily. (0800)   polyethylene glycol powder (GLYCOLAX/MIRALAX) 17 GM/SCOOP powder Take 17 g by mouth daily as needed for moderate constipation or severe constipation. For constipation   simvastatin (ZOCOR) 40 MG tablet TAKE 1 TABLET BY MOUTH DAILY AT BEDTIME   tamsulosin (FLOMAX) 0.4 MG CAPS capsule TAKE ONE CAPSULE BY MOUTH ONCE DAILY AFTER SUPPER **DO NOT CRUSH*   [DISCONTINUED] Vitamin D3 (VITAMIN D) 25 MCG tablet Take 1 tablet (1,000 Units total) by mouth daily.   No facility-administered medications prior to visit.    Allergies  Allergen Reactions   Floxacillin (Flucloxacillin) Other (See Comments)    Reaction unknown   Nsaids  Other (See Comments)    Only one kidney.     Phenothiazines Other (See Comments)    Reaction unknown   Ofloxacin Other (See Comments)    Reaction unknown    Patient Care Team: Agapito Games, MD as PCP - General (Family Medicine) Abran Cantor, MD as Referring Physician (Nephrology)  ROS      Objective  BP 134/78   Pulse 89   Temp 97.9 F (36.6 C)   Resp 14   Ht 6' (1.829 m)   Wt 192 lb (87.1 kg)   SpO2 100%   BMI 26.04 kg/m    Physical Exam Constitutional:      Appearance: He is well-developed.  HENT:     Head: Normocephalic and atraumatic.     Right Ear: Tympanic membrane, ear canal and external ear normal.     Left Ear: Tympanic membrane, ear canal and external ear normal.     Nose: Nose normal.     Mouth/Throat:     Pharynx: Oropharynx is clear.  Eyes:     Conjunctiva/sclera: Conjunctivae normal.     Pupils: Pupils are equal, round, and reactive to light.     Comments: Crusting in the medial corner . Sclera  injected with some of swelling of the the upper and lower right eyelid.  Neck:     Thyroid: No thyromegaly.  Cardiovascular:     Rate and Rhythm: Normal rate and  regular rhythm.     Heart sounds: Normal heart sounds.  Pulmonary:     Effort: Pulmonary effort is normal.     Breath sounds: Normal breath sounds.  Abdominal:     General: Bowel sounds are normal. There is no distension.     Palpations: Abdomen is soft. There is no mass.     Tenderness: There is no abdominal tenderness. There is no guarding or rebound.  Musculoskeletal:        General: Normal range of motion.     Cervical back: Normal range of motion and neck supple. No tenderness.  Lymphadenopathy:     Cervical: No cervical adenopathy.  Skin:    General: Skin is warm and dry.     Comments: Trace ankle edema bilaterally   Neurological:     Mental Status: He is alert and oriented to person, place, and time.     Deep Tendon Reflexes: Reflexes are normal and symmetric.   Psychiatric:        Behavior: Behavior normal.        Thought Content: Thought content normal.        Judgment: Judgment normal.       Most recent functional status assessment:     No data to display         Most recent fall risk assessment:    09/25/2022    2:26 PM  Fall Risk   Falls in the past year? 0  Number falls in past yr: 0  Injury with Fall? 0  Risk for fall due to : No Fall Risks  Follow up Falls evaluation completed    Most recent depression screenings:    09/11/2021   10:39 AM 05/21/2020    1:47 PM  PHQ 2/9 Scores  Exception Documentation Medical reason Medical reason   Most recent cognitive screening:     No data to display         Patient is nonverbal so unable to complete cognitive screening.  Most recent Audit-C alcohol use screening     No data to display         A score of 3 or more in women, and 4 or more in men indicates increased risk for alcohol abuse, EXCEPT if all of the points are from question 1   Vision/Hearing Screen: No results found.  Last CBC Lab Results  Component Value Date   WBC 6.0 09/11/2021   HGB 15.9 09/11/2021   HCT 47.6 09/11/2021   MCV 95.6 09/11/2021   MCH 31.9 09/11/2021   RDW 12.5 09/11/2021   PLT 235 09/11/2021   Last metabolic panel Lab Results  Component Value Date   GLUCOSE 143 (H) 04/02/2022   NA 142 04/02/2022   K 4.6 04/02/2022   CL 107 04/02/2022   CO2 25 04/02/2022   BUN 29 (H) 04/02/2022   CREATININE 2.14 (H) 04/02/2022   EGFR 34 (L) 04/02/2022   CALCIUM 9.7 04/02/2022   PHOS 3.0 11/07/2013   PROT 6.9 09/11/2021   ALBUMIN 4.1 09/21/2017   BILITOT 0.5 09/11/2021   ALKPHOS 131 (H) 09/21/2017   AST 17 09/11/2021   ALT 14 09/11/2021   ANIONGAP 8 06/09/2019   Last lipids Lab Results  Component Value Date   CHOL 180 09/11/2021   HDL 46 09/11/2021   LDLCALC 105 (H) 09/11/2021   TRIG 175 (H) 09/11/2021   CHOLHDL 3.9 09/11/2021      No results found for any visits on  09/25/22.  Assessment & Plan   Annual wellness visit done today including the all of the following: Reviewed patient's Family Medical History Reviewed and updated list of patient's medical providers Assessment of cognitive impairment was done Assessed patient's functional ability Established a written schedule for health screening services Health Risk Assessent Completed and Reviewed  Exercise Activities and Dietary recommendations  Goals      Exercise 150 min/wk Moderate Activity     Please stay active.          Immunization History  Administered Date(s) Administered   Influenza,inj,Quad PF,6+ Mos 11/30/2014, 12/30/2016, 12/30/2017, 12/20/2018, 01/25/2020, 04/02/2022   Influenza-Unspecified 11/29/2012, 01/09/2016   Moderna Sars-Covid-2 Vaccination 04/16/2019   PPD Test 02/08/2018   Tdap 12/30/2016   Zoster Recombinat (Shingrix) 04/20/2018, 10/11/2018    Health Maintenance  Topic Date Due   COVID-19 Vaccine (2 - 2023-24 season) 11/29/2021   INFLUENZA VACCINE  10/30/2022   Medicare Annual Wellness (AWV)  09/25/2023   Fecal DNA (Cologuard)  10/12/2024   DTaP/Tdap/Td (2 - Td or Tdap) 12/31/2026   Hepatitis C Screening  Completed   HIV Screening  Completed   Zoster Vaccines- Shingrix  Completed   Pneumococcal Vaccine 64-57 Years old  Aged Out   HPV VACCINES  Aged Out     Discussed health benefits of physical activity, and encouraged him to engage in regular exercise appropriate for his age and condition.    Problem List Items Addressed This Visit       Genitourinary   Chronic kidney disease (CKD) stage G3b/A1, moderately decreased glomerular filtration rate (GFR) between 30-44 mL/min/1.73 square meter and albuminuria creatinine ratio less than 30 mg/g ([facility])   Relevant Orders   COMPLETE METABOLIC PANEL WITH GFR   Lipid Panel w/reflex Direct LDL   BPH (benign prostatic hyperplasia)   Relevant Orders   PSA   COMPLETE METABOLIC PANEL WITH GFR   Lipid Panel  w/reflex Direct LDL     Other   Hyperlipidemia   Relevant Orders   PSA   COMPLETE METABOLIC PANEL WITH GFR   Lipid Panel w/reflex Direct LDL   Other Visit Diagnoses     Encounter for Medicare annual wellness exam    -  Primary   Acute bacterial conjunctivitis of right eye       Relevant Medications   azithromycin (AZASITE) 1 % ophthalmic solution   Other Relevant Orders   PSA   COMPLETE METABOLIC PANEL WITH GFR   Lipid Panel w/reflex Direct LDL      Up-to-date on vaccines will get updated lab draw today.  Conjunctivitis of the right eye-will treat with azithromycin ophthalmic solution.  If not better in 1 week please let us know.  Return in about 6 months (around 03/27/2023) for Check swelling and labs .     [name], MD

## 2022-09-25 NOTE — Progress Notes (Deleted)
Complete physical exam  Patient: Richard [name]   DOB: [birth year]   67 y.o. Male  MRN: [id number]  Subjective:    No chief complaint on file.   Richard [name] is a 67 y.o. male who presents today for a complete physical exam. He reports consuming a general diet. The patient does not participate in regular exercise at present. He generally feels well. He reports sleeping {DESC; WELL/FAIRLY WELL/POORLY:18703}. He {does/does not:200015} have additional problems to discuss today.    Most recent fall risk assessment:    09/11/2021   10:39 AM  Fall Risk   Falls in the past year? 0  Number falls in past yr: 0  Injury with Fall? 0  Risk for fall due to : No Fall Risks  Follow up Falls prevention discussed     Most recent depression screenings:    09/11/2021   10:39 AM 05/21/2020    1:47 PM  PHQ 2/9 Scores  Exception Documentation Medical reason Medical reason    {VISON DENTAL STD PSA (Optional):27386}  {History (Optional):23778}  Patient Care Team: Agapito Games, MD as PCP - General (Family Medicine) Abran Cantor, MD as Referring Physician (Nephrology)   Outpatient Medications Prior to Visit  Medication Sig   Calcium Carbonate-Vitamin D 600-400 MG-UNIT tablet Take 1 tablet by mouth daily.   docusate sodium (COLACE) 100 MG capsule Take 1 capsule (100 mg total) by mouth 2 (two) times daily.   furosemide (LASIX) 20 MG tablet TAKE 1 TABLET BY MOUTH EVERY OTHER DAY   Multiple Vitamin (MULTIVITAMIN WITH MINERALS) TABS tablet Take 1 tablet by mouth daily. (0800)   polyethylene glycol powder (GLYCOLAX/MIRALAX) 17 GM/SCOOP powder Take 17 g by mouth daily as needed for moderate constipation or severe constipation. For constipation   simvastatin (ZOCOR) 40 MG tablet TAKE 1 TABLET BY MOUTH DAILY AT BEDTIME   tamsulosin (FLOMAX) 0.4 MG CAPS capsule TAKE ONE CAPSULE BY MOUTH ONCE DAILY AFTER SUPPER **DO NOT CRUSH*   Vitamin D3 (VITAMIN D) 25 MCG tablet Take 1 tablet (1,000  Units total) by mouth daily.   No facility-administered medications prior to visit.    ROS        Objective:     There were no vitals taken for this visit. {Vitals History (Optional):23777}  Physical Exam Constitutional:      Appearance: He is well-developed.  HENT:     Head: Normocephalic and atraumatic.     Right Ear: Tympanic membrane, ear canal and external ear normal.     Left Ear: Tympanic membrane, ear canal and external ear normal.     Nose: Nose normal.     Mouth/Throat:     Pharynx: Oropharynx is clear.  Eyes:     Conjunctiva/sclera: Conjunctivae normal.     Pupils: Pupils are equal, round, and reactive to light.  Neck:     Thyroid: No thyromegaly.  Cardiovascular:     Rate and Rhythm: Normal rate and regular rhythm.     Heart sounds: Normal heart sounds.  Pulmonary:     Effort: Pulmonary effort is normal.     Breath sounds: Normal breath sounds.  Abdominal:     General: Bowel sounds are normal. There is no distension.     Palpations: Abdomen is soft. There is no mass.     Tenderness: There is no abdominal tenderness. There is no guarding or rebound.  Musculoskeletal:        General: Normal range of motion.     Cervical back:  Normal range of motion and neck supple. No tenderness.  Lymphadenopathy:     Cervical: No cervical adenopathy.  Skin:    General: Skin is warm and dry.  Neurological:     Mental Status: He is alert and oriented to person, place, and time.     Deep Tendon Reflexes: Reflexes are normal and symmetric.  Psychiatric:        Behavior: Behavior normal.        Thought Content: Thought content normal.        Judgment: Judgment normal.      No results found for any visits on 09/25/22. {Show previous labs (optional):23779}    Assessment & Plan:    Routine Health Maintenance and Physical Exam  Immunization History  Administered Date(s) Administered   Influenza,inj,Quad PF,6+ Mos 11/30/2014, 12/30/2016, 12/30/2017, 12/20/2018,  01/25/2020, 04/02/2022   Influenza-Unspecified 11/29/2012, 01/09/2016   Moderna Sars-Covid-2 Vaccination 04/16/2019   PPD Test 02/08/2018   Tdap 12/30/2016   Zoster Recombinat (Shingrix) 04/20/2018, 10/11/2018    Health Maintenance  Topic Date Due   COVID-19 Vaccine (2 - 2023-24 season) 11/29/2021   Medicare Annual Wellness (AWV)  09/12/2022   INFLUENZA VACCINE  10/30/2022   Fecal DNA (Cologuard)  10/12/2024   DTaP/Tdap/Td (2 - Td or Tdap) 12/31/2026   Hepatitis C Screening  Completed   HIV Screening  Completed   Zoster Vaccines- Shingrix  Completed   Pneumococcal Vaccine 30-79 Years old  Aged Out   HPV VACCINES  Aged Out    Discussed health benefits of physical activity, and encouraged him to engage in regular exercise appropriate for his age and condition.  Problem List Items Addressed This Visit   None Visit Diagnoses     Wellness examination    -  Primary       Keep up a regular exercise program and make sure you are eating a healthy diet Try to eat 4 servings of dairy a day, or if you are lactose intolerant take a calcium with vitamin D daily.  Your vaccines are up to date.   No follow-ups on file.     [name], MD

## 2022-09-26 LAB — LIPID PANEL W/REFLEX DIRECT LDL
Cholesterol: 175 mg/dL (ref <200)
HDL: 53 mg/dL (ref >=40)
LDL Cholesterol (Calc): 85 mg/dL (calc)
Non-HDL Cholesterol (Calc): 122 mg/dL (calc) (ref <130)
Total CHOL/HDL Ratio: 3.3 (calc) (ref <5.0)
Triglycerides: 283 mg/dL — ABNORMAL HIGH (ref <150)

## 2022-09-26 LAB — COMPLETE METABOLIC PANEL WITH GFR
AG Ratio: 1.7 (calc) (ref 1.0–2.5)
ALT: 13 U/L (ref 9–46)
AST: 17 U/L (ref 10–35)
Albumin: 4.4 g/dL (ref 3.6–5.1)
Alkaline phosphatase (APISO): 85 U/L (ref 35–144)
BUN/Creatinine Ratio: 13 (calc) (ref 6–22)
BUN: 27 mg/dL — ABNORMAL HIGH (ref 7–25)
CO2: 23 mmol/L (ref 20–32)
Calcium: 9.5 mg/dL (ref 8.6–10.3)
Chloride: 108 mmol/L (ref 98–110)
Creat: 2.12 mg/dL — ABNORMAL HIGH (ref 0.70–1.35)
Globulin: 2.6 g/dL (calc) (ref 1.9–3.7)
Glucose, Bld: 116 mg/dL — ABNORMAL HIGH (ref 65–99)
Potassium: 4.2 mmol/L (ref 3.5–5.3)
Sodium: 142 mmol/L (ref 135–146)
Total Bilirubin: 0.4 mg/dL (ref 0.2–1.2)
Total Protein: 7 g/dL (ref 6.1–8.1)
eGFR: 34 mL/min/{1.73_m2} — ABNORMAL LOW (ref >=60)

## 2022-09-26 LAB — PSA: PSA: 3.15 ng/mL (ref <=4.00)

## 2022-09-26 NOTE — Progress Notes (Signed)
Call caregiver: Kidney function is stable .   Cholesterol looks better this time which is great.  He has a which is the prostate test is normal.

## 2022-09-29 ENCOUNTER — Telehealth: Payer: Self-pay | Admitting: General Practice

## 2022-09-29 NOTE — Transitions of Care (Post Inpatient/ED Visit) (Signed)
09/29/2022  Name: Richard [name] MRN: [id number] DOB: [birth year]  Today's TOC FU Call Status: Today's TOC FU Call Status:: Successful TOC FU Call Competed TOC FU Call Complete Date: 09/29/22  Transition Care Management Follow-up Telephone Call Date of Discharge: 09/26/22 Discharge Facility: Other ([facility]) Name of Other ([facility]) Discharge Facility: [facility] Type of Discharge: Emergency Department Reason for ED Visit: Other: (eye pressure) How have you been since you were released from the hospital?: Better Any questions or concerns?: No  Items Reviewed: Did you receive and understand the discharge instructions provided?: Yes Medications obtained,verified, and reconciled?: Partial Review Completed Reason for Partial Mediation Review: Did not have the medications in hand Any new allergies since your discharge?: No Dietary orders reviewed?: NA Do you have support at home?: Yes People in Home:  (lives at a facility)  Medications Reviewed Today: Medications Reviewed Today     Reviewed by Modesto Charon, RN (Registered Nurse) on 09/29/22 at 1544  Med List Status: <None>   Medication Order Taking? Sig Documenting Provider Last Dose Status Informant  azithromycin (AZASITE) 1 % ophthalmic solution [id number] No Place 1 drop into the right eye 2 (two) times daily. For 2 days,  and then 1 daily  x 5 days Agapito Games, MD Unknown Active   brimonidine (ALPHAGAN) 0.15 % ophthalmic solution [id number] No Apply to eye. [provider] Unknown Active     Discontinued 02/07/20 1746 (Reorder) Calcium Carbonate-Vitamin D 600-400 MG-UNIT tablet [id number]  Take 1 tablet by mouth daily. Agapito Games, MD  Active   diphenoxylate-atropine (LOMOTIL) 2.5-0.025 MG tablet [id number] No Take by mouth. [provider] Unknown Active   docusate sodium (COLACE) 100 MG capsule [id number]  Take 1 capsule (100 mg total) by mouth 2 (two) times daily.  Agapito Games, MD  Active   dorzolamide (TRUSOPT) 2 % ophthalmic solution [id number] No Apply to eye. [provider] Unknown Active   erythromycin ophthalmic ointment [id number]  Apply to eye. [provider]  Active   furosemide (LASIX) 20 MG tablet [id number]  TAKE 1 TABLET BY MOUTH EVERY OTHER DAY Agapito Games, MD  Active   latanoprost (XALATAN) 0.005 % ophthalmic solution [id number] No Apply to eye. [provider] Unknown Active   Multiple Vitamin (MULTIVITAMIN WITH MINERALS) TABS tablet [id number]  Take 1 tablet by mouth daily. (0800) Agapito Games, MD  Active   polyethylene glycol powder (GLYCOLAX/MIRALAX) 17 GM/SCOOP powder [id number]  Take 17 g by mouth daily as needed for moderate constipation or severe constipation. For constipation Agapito Games, MD  Active   simvastatin (ZOCOR) 40 MG tablet [id number]  TAKE 1 TABLET BY MOUTH DAILY AT BEDTIME Agapito Games, MD  Active   tamsulosin (FLOMAX) 0.4 MG CAPS capsule [id number]  TAKE ONE CAPSULE BY MOUTH ONCE DAILY AFTER SUPPER **DO NOT CRUSH* Agapito Games, MD  Active   Med List Note Earlie Lou, CPhT 09/21/17 1152): Patient is a resident at [facility] in [place].            Home Care and Equipment/Supplies: Were Home Health Services Ordered?: NA Any new equipment or medical supplies ordered?: NA  Functional Questionnaire: Do you need assistance with bathing/showering or dressing?: No Do you need assistance with meal preparation?: No Do you need assistance with eating?: No Do you have difficulty maintaining continence: No Do you need assistance with getting out of bed/getting out of a chair/moving?: No Do you have difficulty managing or  taking your medications?: No  Follow up appointments reviewed: PCP Follow-up appointment confirmed?: NA Specialist Hospital Follow-up appointment confirmed?: No Reason Specialist Follow-Up Not Confirmed: Patient has  Specialist Provider Number and will Call for Appointment Do you need transportation to your follow-up appointment?: No Do you understand care options if your condition(s) worsen?: Yes-patient verbalized understanding    SIGNATURE Modesto Charon, RN BSN Nurse Health Advisor

## 2022-10-01 ENCOUNTER — Telehealth: Payer: Self-pay | Admitting: Family Medicine

## 2022-10-01 ENCOUNTER — Ambulatory Visit: Payer: Medicare Other | Admitting: Family Medicine

## 2022-10-01 ENCOUNTER — Ambulatory Visit (INDEPENDENT_AMBULATORY_CARE_PROVIDER_SITE_OTHER): Payer: 59 | Admitting: Family Medicine

## 2022-10-01 DIAGNOSIS — H40051 Ocular hypertension, right eye: Secondary | ICD-10-CM

## 2022-10-01 NOTE — Patient Instructions (Signed)
Please continue all prescription eyedrop medication.  Work on a work to get him back in hopefully with Dr. Sondra Barges in [facility] next week.  To recheck the pressures and at that point they can make adjustments or discontinue the medication as they see fit.

## 2022-10-01 NOTE — Progress Notes (Signed)
   Acute Office Visit  Subjective:     Patient ID: Richard [name], male    DOB: [birth year], 67 y.o.   MRN: [id number]  No chief complaint on file.   HPI Patient is in today for follow-up of increased ocular intraocular pressure in his right eye.  He ended up going to the emergency department. On June 28 on June 28 at [facility].  He was noted to have significantly elevated intraocular pressures and was started on eyedrops.  He is here today for follow-up.  They wanted to know how long he needs to be on the eyedrops.  We discussed that he will need to get back in with an ophthalmologist to have pressures rechecked and adjustments made and at that point in time they can determine the dosing and which drops to continue.  I suspect he will need to be monitored for a while.  The redness around his eye is improving which is great.  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 Jonny Ruiz - presented to routine eye exam today found to have elevated IOP OD and NVI - IOP on presentation 45 OD on no treatment, given cosopt and brimonidine with improvement of IOP to 32 - posterior exam revealing no diabetic retinopathy or evidence of CRVO to suggest alternative etiology of NVI beyond inflammation - NVI also not peri-pupillary on exam prior to dilation PLAN - max topical IOP therapy with cosopt BID OD, brimonidine TID OD, and latanoprost at bedtime OD - start pred forte QID OD - start atropine BID OD - suspect pt will need oil removal OD  - recommend follow up with retina in 1-2 weeks, ophtho to arrange   ROS      Objective:    There were no vitals taken for this visit.   Physical Exam  No results found for any visits on 10/01/22.      Assessment & Plan:   Problem List Items Addressed This Visit   None Visit Diagnoses     Raised intraocular pressure of right eye    -  Primary      Increased  intraocular pressure in the right eye-wrote instructions to continue with current eyedrops will work to get him back in next week with ophthalmology so that they can reevaluate the eye.  We did discuss that I do not have the equipment to do that here and he would need to see a specialist and then they can make adjustments to his regimen. No orders of the defined types were placed in this encounter.   No follow-ups on file.  I spent 20 minutes on the day of the encounter to include pre-visit record review, face-to-face time with the patient and post visit ordering of test.   [name], MD

## 2022-10-01 NOTE — Telephone Encounter (Signed)
Contacted [facility] ( Spoke with [place])  Patient has an appointment scheduled for 10/06/2022 at 1:30pm at [facility]-6th Floor [place] with Dr. Marlinda Mike [place].  Patient also has an appointment on 10/10/2022 at [facility] with Dr. Arlyss Repress.

## 2022-10-01 NOTE — Telephone Encounter (Signed)
Pt caregiver Richard [name]) is aware of both appointments.

## 2022-10-01 NOTE — Telephone Encounter (Signed)
Please call Dr. Arlyss Repress office.  He is [facility] in [facility].  He is a patient there so we do not necessarily need to do a new referral.  But when he does get him scheduled for next week urgently for follow-up of increased intraocular pressure from recent ED visit from June 28.

## 2022-10-09 ENCOUNTER — Encounter: Payer: Medicare Other | Admitting: Family Medicine

## 2022-10-26 ENCOUNTER — Encounter: Payer: Self-pay | Admitting: Family Medicine

## 2022-10-28 NOTE — Telephone Encounter (Signed)
Faxed and confirmation received.

## 2022-10-28 NOTE — Telephone Encounter (Signed)
See printed doc. Please fax or attach

## 2023-03-26 ENCOUNTER — Ambulatory Visit: Payer: Medicare Other | Admitting: Family Medicine

## 2023-03-26 ENCOUNTER — Telehealth: Payer: Self-pay | Admitting: *Deleted

## 2023-03-26 NOTE — Telephone Encounter (Signed)
Called pt's brother and lvm  to see if pt was actually coming in today because it looked as if he transferred his care. Asked that he rtn call to clarify this for [place].
